# Patient Record
Sex: Female | Born: 1935 | Race: White | Hispanic: No | Smoking: Never smoker
Health system: Southern US, Community
[De-identification: ages and names within clinical notes are randomized; demographics above are authoritative.]

## PROBLEM LIST (undated history)

## (undated) DIAGNOSIS — I10 Essential (primary) hypertension: Secondary | ICD-10-CM

## (undated) DIAGNOSIS — Z8719 Personal history of other diseases of the digestive system: Secondary | ICD-10-CM

## (undated) DIAGNOSIS — E559 Vitamin D deficiency, unspecified: Secondary | ICD-10-CM

## (undated) DIAGNOSIS — Z86010 Personal history of colonic polyps: Secondary | ICD-10-CM

## (undated) DIAGNOSIS — K259 Gastric ulcer, unspecified as acute or chronic, without hemorrhage or perforation: Secondary | ICD-10-CM

## (undated) DIAGNOSIS — J189 Pneumonia, unspecified organism: Secondary | ICD-10-CM

## (undated) DIAGNOSIS — G8929 Other chronic pain: Secondary | ICD-10-CM

## (undated) DIAGNOSIS — N189 Chronic kidney disease, unspecified: Secondary | ICD-10-CM

## (undated) DIAGNOSIS — E079 Disorder of thyroid, unspecified: Secondary | ICD-10-CM

## (undated) DIAGNOSIS — F0390 Unspecified dementia without behavioral disturbance: Secondary | ICD-10-CM

## (undated) DIAGNOSIS — K219 Gastro-esophageal reflux disease without esophagitis: Secondary | ICD-10-CM

## (undated) HISTORY — DX: Personal history of other diseases of the digestive system: Z87.19

## (undated) HISTORY — DX: Unspecified dementia, unspecified severity, without behavioral disturbance, psychotic disturbance, mood disturbance, and anxiety: F03.90

## (undated) HISTORY — DX: Disorder of thyroid, unspecified: E07.9

## (undated) HISTORY — DX: Vitamin D deficiency, unspecified: E55.9

## (undated) HISTORY — DX: Pneumonia, unspecified organism: J18.9

## (undated) HISTORY — DX: Essential (primary) hypertension: I10

## (undated) HISTORY — DX: Personal history of colonic polyps: Z86.010

## (undated) HISTORY — DX: Gastric ulcer, unspecified as acute or chronic, without hemorrhage or perforation: K25.9

## (undated) HISTORY — DX: Gastro-esophageal reflux disease without esophagitis: K21.9

## (undated) HISTORY — DX: Other chronic pain: G89.29

## (undated) HISTORY — DX: Chronic kidney disease, unspecified: N18.9

---

## 1988-01-04 LAB — CONVERTED CEMR LAB: Pap Smear: NORMAL

## 2002-10-20 ENCOUNTER — Emergency Department (HOSPITAL_COMMUNITY): Admission: EM | Admit: 2002-10-20 | Discharge: 2002-10-20 | Payer: Self-pay | Admitting: Emergency Medicine

## 2006-08-24 ENCOUNTER — Emergency Department (HOSPITAL_COMMUNITY): Admission: EM | Admit: 2006-08-24 | Discharge: 2006-08-25 | Payer: Self-pay | Admitting: *Deleted

## 2007-12-30 ENCOUNTER — Telehealth: Payer: Self-pay | Admitting: Gastroenterology

## 2008-01-01 ENCOUNTER — Ambulatory Visit: Payer: Self-pay | Admitting: *Deleted

## 2008-01-01 DIAGNOSIS — F329 Major depressive disorder, single episode, unspecified: Secondary | ICD-10-CM

## 2008-01-01 DIAGNOSIS — Z860100 Personal history of colon polyps, unspecified: Secondary | ICD-10-CM | POA: Insufficient documentation

## 2008-01-01 DIAGNOSIS — R32 Unspecified urinary incontinence: Secondary | ICD-10-CM

## 2008-01-01 DIAGNOSIS — J4489 Other specified chronic obstructive pulmonary disease: Secondary | ICD-10-CM | POA: Insufficient documentation

## 2008-01-01 DIAGNOSIS — Z8719 Personal history of other diseases of the digestive system: Secondary | ICD-10-CM

## 2008-01-01 DIAGNOSIS — J449 Chronic obstructive pulmonary disease, unspecified: Secondary | ICD-10-CM

## 2008-01-01 DIAGNOSIS — R5381 Other malaise: Secondary | ICD-10-CM

## 2008-01-01 DIAGNOSIS — H409 Unspecified glaucoma: Secondary | ICD-10-CM | POA: Insufficient documentation

## 2008-01-01 DIAGNOSIS — G4700 Insomnia, unspecified: Secondary | ICD-10-CM

## 2008-01-01 DIAGNOSIS — K259 Gastric ulcer, unspecified as acute or chronic, without hemorrhage or perforation: Secondary | ICD-10-CM | POA: Insufficient documentation

## 2008-01-01 DIAGNOSIS — R197 Diarrhea, unspecified: Secondary | ICD-10-CM

## 2008-01-01 DIAGNOSIS — E039 Hypothyroidism, unspecified: Secondary | ICD-10-CM | POA: Insufficient documentation

## 2008-01-01 DIAGNOSIS — I2510 Atherosclerotic heart disease of native coronary artery without angina pectoris: Secondary | ICD-10-CM | POA: Insufficient documentation

## 2008-01-01 DIAGNOSIS — J309 Allergic rhinitis, unspecified: Secondary | ICD-10-CM | POA: Insufficient documentation

## 2008-01-01 DIAGNOSIS — M1990 Unspecified osteoarthritis, unspecified site: Secondary | ICD-10-CM

## 2008-01-01 DIAGNOSIS — I499 Cardiac arrhythmia, unspecified: Secondary | ICD-10-CM | POA: Insufficient documentation

## 2008-01-01 DIAGNOSIS — R5383 Other fatigue: Secondary | ICD-10-CM

## 2008-01-01 DIAGNOSIS — K219 Gastro-esophageal reflux disease without esophagitis: Secondary | ICD-10-CM

## 2008-01-01 DIAGNOSIS — I10 Essential (primary) hypertension: Secondary | ICD-10-CM | POA: Insufficient documentation

## 2008-01-01 HISTORY — DX: Personal history of other diseases of the digestive system: Z87.19

## 2008-01-01 HISTORY — DX: Personal history of colonic polyps: Z86.010

## 2008-01-01 HISTORY — DX: Personal history of colon polyps, unspecified: Z86.0100

## 2008-01-01 HISTORY — DX: Gastric ulcer, unspecified as acute or chronic, without hemorrhage or perforation: K25.9

## 2008-01-08 LAB — CONVERTED CEMR LAB
AST: 26 units/L (ref 0–37)
BUN: 21 mg/dL (ref 6–23)
Basophils Absolute: 0.1 10*3/uL (ref 0.0–0.1)
Calcium: 9.8 mg/dL (ref 8.4–10.5)
Chloride: 101 meq/L (ref 96–112)
Glucose, Bld: 130 mg/dL — ABNORMAL HIGH (ref 70–99)
HCT: 45.3 % (ref 36.0–46.0)
Lymphocytes Relative: 20 % (ref 12–46)
Lymphs Abs: 2.8 10*3/uL (ref 0.7–4.0)
MCHC: 33.3 g/dL (ref 30.0–36.0)
MCV: 91.7 fL (ref 78.0–100.0)
Neutrophils Relative %: 71 % (ref 43–77)
Platelets: 243 10*3/uL (ref 150–400)
Potassium: 3.9 meq/L (ref 3.5–5.3)
RDW: 12.5 % (ref 11.5–15.5)
Sodium: 137 meq/L (ref 135–145)
TSH: 2.435 microintl units/mL (ref 0.350–4.50)
Total Protein: 7.8 g/dL (ref 6.0–8.3)

## 2008-01-15 ENCOUNTER — Telehealth: Payer: Self-pay | Admitting: Gastroenterology

## 2008-09-30 ENCOUNTER — Emergency Department (HOSPITAL_COMMUNITY): Admission: EM | Admit: 2008-09-30 | Discharge: 2008-09-30 | Payer: Self-pay | Admitting: Emergency Medicine

## 2009-07-25 ENCOUNTER — Observation Stay (HOSPITAL_COMMUNITY): Admission: EM | Admit: 2009-07-25 | Discharge: 2009-07-28 | Payer: Self-pay | Admitting: Emergency Medicine

## 2009-08-19 ENCOUNTER — Inpatient Hospital Stay (HOSPITAL_COMMUNITY): Admission: EM | Admit: 2009-08-19 | Discharge: 2009-08-22 | Payer: Self-pay | Admitting: Emergency Medicine

## 2009-08-21 ENCOUNTER — Encounter (INDEPENDENT_AMBULATORY_CARE_PROVIDER_SITE_OTHER): Payer: Self-pay | Admitting: Internal Medicine

## 2009-08-21 ENCOUNTER — Ambulatory Visit: Payer: Self-pay | Admitting: Vascular Surgery

## 2010-07-24 LAB — CARDIAC PANEL(CRET KIN+CKTOT+MB+TROPI)
CK, MB: 3.9 ng/mL (ref 0.3–4.0)
Relative Index: 1.8 (ref 0.0–2.5)
Relative Index: 1.8 (ref 0.0–2.5)
Total CK: 215 U/L — ABNORMAL HIGH (ref 7–177)
Troponin I: 0.06 ng/mL (ref 0.00–0.06)

## 2010-07-24 LAB — URINALYSIS, ROUTINE W REFLEX MICROSCOPIC
Ketones, ur: NEGATIVE mg/dL
Nitrite: NEGATIVE
Protein, ur: NEGATIVE mg/dL
pH: 6 (ref 5.0–8.0)

## 2010-07-24 LAB — COMPREHENSIVE METABOLIC PANEL
BUN: 61 mg/dL — ABNORMAL HIGH (ref 6–23)
CO2: 29 mEq/L (ref 19–32)
Chloride: 97 mEq/L (ref 96–112)
Creatinine, Ser: 1.87 mg/dL — ABNORMAL HIGH (ref 0.4–1.2)
GFR calc non Af Amer: 25 mL/min — ABNORMAL LOW (ref >60)
Glucose, Bld: 150 mg/dL — ABNORMAL HIGH (ref 70–99)
Potassium: 5 mEq/L (ref 3.5–5.1)
Total Bilirubin: 0.9 mg/dL (ref 0.3–1.2)
Total Protein: 7.1 g/dL (ref 6.0–8.3)

## 2010-07-24 LAB — BASIC METABOLIC PANEL
BUN: 32 mg/dL — ABNORMAL HIGH (ref 6–23)
CO2: 28 mEq/L (ref 19–32)
CO2: 28 mEq/L (ref 19–32)
Calcium: 8.5 mg/dL (ref 8.4–10.5)
Calcium: 8.8 mg/dL (ref 8.4–10.5)
Chloride: 100 mEq/L (ref 96–112)
Chloride: 99 mEq/L (ref 96–112)
Creatinine, Ser: 1.05 mg/dL (ref 0.4–1.2)
GFR calc Af Amer: 59 mL/min — ABNORMAL LOW (ref >60)
GFR calc Af Amer: >60 mL/min (ref >60)
Glucose, Bld: 132 mg/dL — ABNORMAL HIGH (ref 70–99)
Potassium: 4.6 mEq/L (ref 3.5–5.1)
Sodium: 134 mEq/L — ABNORMAL LOW (ref 135–145)
Sodium: 135 mEq/L (ref 135–145)

## 2010-07-24 LAB — TSH: TSH: 1.808 u[IU]/mL (ref 0.350–4.500)

## 2010-07-24 LAB — POCT CARDIAC MARKERS
CKMB, poc: 1.2 ng/mL (ref 1.0–8.0)
Myoglobin, poc: 227 ng/mL (ref 12–200)
Troponin i, poc: <0.05 ng/mL (ref 0.00–0.09)
Troponin i, poc: <0.05 ng/mL (ref 0.00–0.09)

## 2010-07-24 LAB — DIFFERENTIAL
Basophils Relative: 0 % (ref 0–1)
Eosinophils Absolute: 0.1 10*3/uL (ref 0.0–0.7)
Lymphocytes Relative: 6 % — ABNORMAL LOW (ref 12–46)
Lymphs Abs: 1.2 10*3/uL (ref 0.7–4.0)
Neutro Abs: 17.5 10*3/uL — ABNORMAL HIGH (ref 1.7–7.7)
Neutrophils Relative %: 88 % — ABNORMAL HIGH (ref 43–77)

## 2010-07-24 LAB — CBC
HCT: 40 % (ref 36.0–46.0)
Hemoglobin: 12.8 g/dL (ref 12.0–15.0)
Hemoglobin: 13.1 g/dL (ref 12.0–15.0)
MCHC: 34.9 g/dL (ref 30.0–36.0)
MCHC: 35.5 g/dL (ref 30.0–36.0)
Platelets: 160 10*3/uL (ref 150–400)
Platelets: 182 10*3/uL (ref 150–400)
RBC: 3.76 MIL/uL — ABNORMAL LOW (ref 3.87–5.11)
RBC: 3.97 MIL/uL (ref 3.87–5.11)
RBC: 4.05 MIL/uL (ref 3.87–5.11)
WBC: 11.6 10*3/uL — ABNORMAL HIGH (ref 4.0–10.5)
WBC: 11.7 10*3/uL — ABNORMAL HIGH (ref 4.0–10.5)
WBC: 18.1 10*3/uL — ABNORMAL HIGH (ref 4.0–10.5)
WBC: 19.8 10*3/uL — ABNORMAL HIGH (ref 4.0–10.5)

## 2010-07-24 LAB — CULTURE, BLOOD (ROUTINE X 2): Culture: NO GROWTH

## 2010-07-24 LAB — CK TOTAL AND CKMB (NOT AT ARMC)
CK, MB: 4 ng/mL (ref 0.3–4.0)
Total CK: 174 U/L (ref 7–177)

## 2010-07-24 LAB — TROPONIN I: Troponin I: 0.02 ng/mL (ref 0.00–0.06)

## 2010-07-24 LAB — BRAIN NATRIURETIC PEPTIDE: Pro B Natriuretic peptide (BNP): 250 pg/mL — ABNORMAL HIGH (ref 0.0–100.0)

## 2010-07-29 LAB — BASIC METABOLIC PANEL
BUN: 23 mg/dL (ref 6–23)
CO2: 26 mEq/L (ref 19–32)
CO2: 26 mEq/L (ref 19–32)
Calcium: 8.8 mg/dL (ref 8.4–10.5)
Calcium: 8.9 mg/dL (ref 8.4–10.5)
Calcium: 9.3 mg/dL (ref 8.4–10.5)
Chloride: 103 mEq/L (ref 96–112)
Chloride: 107 mEq/L (ref 96–112)
Creatinine, Ser: 0.69 mg/dL (ref 0.4–1.2)
Creatinine, Ser: 0.85 mg/dL (ref 0.4–1.2)
GFR calc Af Amer: >60 mL/min (ref >60)
GFR calc Af Amer: >60 mL/min (ref >60)
GFR calc Af Amer: >60 mL/min (ref >60)
GFR calc Af Amer: >60 mL/min (ref >60)
GFR calc non Af Amer: >60 mL/min (ref >60)
GFR calc non Af Amer: >60 mL/min (ref >60)
Glucose, Bld: 104 mg/dL — ABNORMAL HIGH (ref 70–99)
Glucose, Bld: 90 mg/dL (ref 70–99)
Potassium: 4.2 mEq/L (ref 3.5–5.1)
Potassium: 4.5 mEq/L (ref 3.5–5.1)
Sodium: 136 mEq/L (ref 135–145)
Sodium: 136 mEq/L (ref 135–145)
Sodium: 139 mEq/L (ref 135–145)

## 2010-07-29 LAB — CBC
HCT: 42 % (ref 36.0–46.0)
HCT: 46.1 % — ABNORMAL HIGH (ref 36.0–46.0)
Hemoglobin: 15.2 g/dL — ABNORMAL HIGH (ref 12.0–15.0)
MCHC: 33 g/dL (ref 30.0–36.0)
MCV: 94.1 fL (ref 78.0–100.0)
Platelets: 189 10*3/uL (ref 150–400)
RBC: 4.9 MIL/uL (ref 3.87–5.11)
RDW: 12.6 % (ref 11.5–15.5)
RDW: 12.8 % (ref 11.5–15.5)
WBC: 11.7 10*3/uL — ABNORMAL HIGH (ref 4.0–10.5)

## 2010-07-29 LAB — URINALYSIS, ROUTINE W REFLEX MICROSCOPIC
Bilirubin Urine: NEGATIVE
Hgb urine dipstick: NEGATIVE
Ketones, ur: NEGATIVE mg/dL
Nitrite: NEGATIVE
Specific Gravity, Urine: 1.025 (ref 1.005–1.030)
Urobilinogen, UA: 0.2 mg/dL (ref 0.0–1.0)

## 2010-07-29 LAB — DIFFERENTIAL
Basophils Absolute: 0 10*3/uL (ref 0.0–0.1)
Basophils Relative: 0 % (ref 0–1)
Eosinophils Absolute: 0.2 10*3/uL (ref 0.0–0.7)
Eosinophils Relative: 1 % (ref 0–5)
Monocytes Absolute: 0.9 10*3/uL (ref 0.1–1.0)
Monocytes Relative: 7 % (ref 3–12)

## 2010-07-29 LAB — CARDIAC PANEL(CRET KIN+CKTOT+MB+TROPI)
CK, MB: 3 ng/mL (ref 0.3–4.0)
Relative Index: INVALID (ref 0.0–2.5)
Total CK: 47 U/L (ref 7–177)
Troponin I: 0.01 ng/mL (ref 0.00–0.06)
Troponin I: 0.02 ng/mL (ref 0.00–0.06)

## 2010-07-29 LAB — URINE CULTURE

## 2010-07-29 LAB — T4, FREE: Free T4: 1.07 ng/dL (ref 0.80–1.80)

## 2010-07-29 LAB — TROPONIN I: Troponin I: 0.01 ng/mL (ref 0.00–0.06)

## 2010-07-29 LAB — CK TOTAL AND CKMB (NOT AT ARMC)
CK, MB: 2.6 ng/mL (ref 0.3–4.0)
Total CK: 113 U/L (ref 7–177)

## 2011-07-01 DIAGNOSIS — F411 Generalized anxiety disorder: Secondary | ICD-10-CM | POA: Diagnosis not present

## 2011-07-01 DIAGNOSIS — F329 Major depressive disorder, single episode, unspecified: Secondary | ICD-10-CM | POA: Diagnosis not present

## 2011-07-01 DIAGNOSIS — F0390 Unspecified dementia without behavioral disturbance: Secondary | ICD-10-CM | POA: Diagnosis not present

## 2011-07-11 DIAGNOSIS — M79609 Pain in unspecified limb: Secondary | ICD-10-CM | POA: Diagnosis not present

## 2011-07-11 DIAGNOSIS — B351 Tinea unguium: Secondary | ICD-10-CM | POA: Diagnosis not present

## 2011-08-05 DIAGNOSIS — G890 Central pain syndrome: Secondary | ICD-10-CM | POA: Diagnosis not present

## 2011-08-05 DIAGNOSIS — E559 Vitamin D deficiency, unspecified: Secondary | ICD-10-CM | POA: Diagnosis not present

## 2011-08-05 DIAGNOSIS — I10 Essential (primary) hypertension: Secondary | ICD-10-CM | POA: Diagnosis not present

## 2011-08-05 DIAGNOSIS — M1990 Unspecified osteoarthritis, unspecified site: Secondary | ICD-10-CM | POA: Diagnosis not present

## 2011-08-05 DIAGNOSIS — R609 Edema, unspecified: Secondary | ICD-10-CM | POA: Diagnosis not present

## 2011-08-05 DIAGNOSIS — E039 Hypothyroidism, unspecified: Secondary | ICD-10-CM | POA: Diagnosis not present

## 2011-08-06 DIAGNOSIS — Z961 Presence of intraocular lens: Secondary | ICD-10-CM | POA: Diagnosis not present

## 2011-08-06 DIAGNOSIS — H52209 Unspecified astigmatism, unspecified eye: Secondary | ICD-10-CM | POA: Diagnosis not present

## 2011-08-06 DIAGNOSIS — E875 Hyperkalemia: Secondary | ICD-10-CM | POA: Diagnosis not present

## 2011-08-06 DIAGNOSIS — E039 Hypothyroidism, unspecified: Secondary | ICD-10-CM | POA: Diagnosis not present

## 2011-08-06 DIAGNOSIS — E785 Hyperlipidemia, unspecified: Secondary | ICD-10-CM | POA: Diagnosis not present

## 2011-08-06 DIAGNOSIS — D649 Anemia, unspecified: Secondary | ICD-10-CM | POA: Diagnosis not present

## 2011-08-06 DIAGNOSIS — H31019 Macula scars of posterior pole (postinflammatory) (post-traumatic), unspecified eye: Secondary | ICD-10-CM | POA: Diagnosis not present

## 2011-08-06 DIAGNOSIS — H4011X0 Primary open-angle glaucoma, stage unspecified: Secondary | ICD-10-CM | POA: Diagnosis not present

## 2011-08-06 DIAGNOSIS — E559 Vitamin D deficiency, unspecified: Secondary | ICD-10-CM | POA: Diagnosis not present

## 2011-08-19 DIAGNOSIS — F411 Generalized anxiety disorder: Secondary | ICD-10-CM | POA: Diagnosis not present

## 2011-08-19 DIAGNOSIS — F0390 Unspecified dementia without behavioral disturbance: Secondary | ICD-10-CM | POA: Diagnosis not present

## 2011-09-05 DIAGNOSIS — E785 Hyperlipidemia, unspecified: Secondary | ICD-10-CM | POA: Diagnosis not present

## 2011-09-05 DIAGNOSIS — E875 Hyperkalemia: Secondary | ICD-10-CM | POA: Diagnosis not present

## 2011-09-05 DIAGNOSIS — D649 Anemia, unspecified: Secondary | ICD-10-CM | POA: Diagnosis not present

## 2011-09-23 DIAGNOSIS — F0390 Unspecified dementia without behavioral disturbance: Secondary | ICD-10-CM | POA: Diagnosis not present

## 2011-09-23 DIAGNOSIS — F411 Generalized anxiety disorder: Secondary | ICD-10-CM | POA: Diagnosis not present

## 2011-11-05 DIAGNOSIS — E46 Unspecified protein-calorie malnutrition: Secondary | ICD-10-CM | POA: Diagnosis not present

## 2011-11-13 DIAGNOSIS — M79609 Pain in unspecified limb: Secondary | ICD-10-CM | POA: Diagnosis not present

## 2011-11-13 DIAGNOSIS — B351 Tinea unguium: Secondary | ICD-10-CM | POA: Diagnosis not present

## 2011-11-26 DIAGNOSIS — I10 Essential (primary) hypertension: Secondary | ICD-10-CM | POA: Diagnosis not present

## 2011-11-26 DIAGNOSIS — E039 Hypothyroidism, unspecified: Secondary | ICD-10-CM | POA: Diagnosis not present

## 2011-11-26 DIAGNOSIS — G890 Central pain syndrome: Secondary | ICD-10-CM | POA: Diagnosis not present

## 2011-11-26 DIAGNOSIS — F329 Major depressive disorder, single episode, unspecified: Secondary | ICD-10-CM | POA: Diagnosis not present

## 2011-11-26 DIAGNOSIS — E559 Vitamin D deficiency, unspecified: Secondary | ICD-10-CM | POA: Diagnosis not present

## 2011-11-26 DIAGNOSIS — R609 Edema, unspecified: Secondary | ICD-10-CM | POA: Diagnosis not present

## 2011-11-28 DIAGNOSIS — E039 Hypothyroidism, unspecified: Secondary | ICD-10-CM | POA: Diagnosis not present

## 2011-11-28 DIAGNOSIS — D649 Anemia, unspecified: Secondary | ICD-10-CM | POA: Diagnosis not present

## 2011-11-28 DIAGNOSIS — I10 Essential (primary) hypertension: Secondary | ICD-10-CM | POA: Diagnosis not present

## 2011-11-28 DIAGNOSIS — E559 Vitamin D deficiency, unspecified: Secondary | ICD-10-CM | POA: Diagnosis not present

## 2011-12-10 DIAGNOSIS — H4011X0 Primary open-angle glaucoma, stage unspecified: Secondary | ICD-10-CM | POA: Diagnosis not present

## 2011-12-10 DIAGNOSIS — Z961 Presence of intraocular lens: Secondary | ICD-10-CM | POA: Diagnosis not present

## 2011-12-10 DIAGNOSIS — H31019 Macula scars of posterior pole (postinflammatory) (post-traumatic), unspecified eye: Secondary | ICD-10-CM | POA: Diagnosis not present

## 2012-01-15 DIAGNOSIS — F411 Generalized anxiety disorder: Secondary | ICD-10-CM | POA: Diagnosis not present

## 2012-01-15 DIAGNOSIS — F0390 Unspecified dementia without behavioral disturbance: Secondary | ICD-10-CM | POA: Diagnosis not present

## 2012-01-22 DIAGNOSIS — B351 Tinea unguium: Secondary | ICD-10-CM | POA: Diagnosis not present

## 2012-01-22 DIAGNOSIS — M79609 Pain in unspecified limb: Secondary | ICD-10-CM | POA: Diagnosis not present

## 2012-01-27 DIAGNOSIS — F0390 Unspecified dementia without behavioral disturbance: Secondary | ICD-10-CM | POA: Diagnosis not present

## 2012-01-27 DIAGNOSIS — F411 Generalized anxiety disorder: Secondary | ICD-10-CM | POA: Diagnosis not present

## 2012-02-04 DIAGNOSIS — F411 Generalized anxiety disorder: Secondary | ICD-10-CM | POA: Diagnosis not present

## 2012-02-04 DIAGNOSIS — F0390 Unspecified dementia without behavioral disturbance: Secondary | ICD-10-CM | POA: Diagnosis not present

## 2012-02-19 DIAGNOSIS — F0390 Unspecified dementia without behavioral disturbance: Secondary | ICD-10-CM | POA: Diagnosis not present

## 2012-02-19 DIAGNOSIS — F411 Generalized anxiety disorder: Secondary | ICD-10-CM | POA: Diagnosis not present

## 2012-04-07 DIAGNOSIS — F411 Generalized anxiety disorder: Secondary | ICD-10-CM | POA: Diagnosis not present

## 2012-04-07 DIAGNOSIS — F0390 Unspecified dementia without behavioral disturbance: Secondary | ICD-10-CM | POA: Diagnosis not present

## 2012-04-21 DIAGNOSIS — I10 Essential (primary) hypertension: Secondary | ICD-10-CM | POA: Diagnosis not present

## 2012-04-21 DIAGNOSIS — M1990 Unspecified osteoarthritis, unspecified site: Secondary | ICD-10-CM | POA: Diagnosis not present

## 2012-04-21 DIAGNOSIS — R5383 Other fatigue: Secondary | ICD-10-CM | POA: Diagnosis not present

## 2012-04-21 DIAGNOSIS — R609 Edema, unspecified: Secondary | ICD-10-CM | POA: Diagnosis not present

## 2012-05-18 DIAGNOSIS — G890 Central pain syndrome: Secondary | ICD-10-CM | POA: Diagnosis not present

## 2012-05-18 DIAGNOSIS — M1990 Unspecified osteoarthritis, unspecified site: Secondary | ICD-10-CM | POA: Diagnosis not present

## 2012-05-18 DIAGNOSIS — E039 Hypothyroidism, unspecified: Secondary | ICD-10-CM | POA: Diagnosis not present

## 2012-05-18 DIAGNOSIS — R609 Edema, unspecified: Secondary | ICD-10-CM | POA: Diagnosis not present

## 2012-05-18 DIAGNOSIS — I10 Essential (primary) hypertension: Secondary | ICD-10-CM | POA: Diagnosis not present

## 2012-05-18 DIAGNOSIS — E559 Vitamin D deficiency, unspecified: Secondary | ICD-10-CM | POA: Diagnosis not present

## 2012-05-19 DIAGNOSIS — I10 Essential (primary) hypertension: Secondary | ICD-10-CM | POA: Diagnosis not present

## 2012-05-19 DIAGNOSIS — E039 Hypothyroidism, unspecified: Secondary | ICD-10-CM | POA: Diagnosis not present

## 2012-05-19 DIAGNOSIS — Z79899 Other long term (current) drug therapy: Secondary | ICD-10-CM | POA: Diagnosis not present

## 2012-06-08 DIAGNOSIS — F0390 Unspecified dementia without behavioral disturbance: Secondary | ICD-10-CM | POA: Diagnosis not present

## 2012-06-08 DIAGNOSIS — F411 Generalized anxiety disorder: Secondary | ICD-10-CM | POA: Diagnosis not present

## 2012-06-18 DIAGNOSIS — N390 Urinary tract infection, site not specified: Secondary | ICD-10-CM | POA: Diagnosis not present

## 2012-06-22 DIAGNOSIS — E119 Type 2 diabetes mellitus without complications: Secondary | ICD-10-CM | POA: Diagnosis not present

## 2012-06-22 DIAGNOSIS — Z9283 Personal history of failed moderate sedation: Secondary | ICD-10-CM | POA: Diagnosis not present

## 2012-06-22 DIAGNOSIS — Z9181 History of falling: Secondary | ICD-10-CM | POA: Diagnosis not present

## 2012-06-22 DIAGNOSIS — N390 Urinary tract infection, site not specified: Secondary | ICD-10-CM | POA: Diagnosis not present

## 2012-07-02 DIAGNOSIS — R1110 Vomiting, unspecified: Secondary | ICD-10-CM | POA: Diagnosis not present

## 2012-07-02 DIAGNOSIS — R112 Nausea with vomiting, unspecified: Secondary | ICD-10-CM | POA: Diagnosis not present

## 2012-07-06 DIAGNOSIS — F0390 Unspecified dementia without behavioral disturbance: Secondary | ICD-10-CM | POA: Diagnosis not present

## 2012-07-06 DIAGNOSIS — F411 Generalized anxiety disorder: Secondary | ICD-10-CM | POA: Diagnosis not present

## 2012-08-05 ENCOUNTER — Other Ambulatory Visit: Payer: Self-pay | Admitting: *Deleted

## 2012-08-05 MED ORDER — MORPHINE SULFATE ER 15 MG PO TBCR: EXTENDED_RELEASE_TABLET | ORAL | Status: DC

## 2012-09-02 ENCOUNTER — Other Ambulatory Visit: Payer: Self-pay | Admitting: *Deleted

## 2012-09-02 MED ORDER — MORPHINE SULFATE ER 15 MG PO TBCR: EXTENDED_RELEASE_TABLET | ORAL | Status: DC

## 2012-09-07 DIAGNOSIS — M79609 Pain in unspecified limb: Secondary | ICD-10-CM | POA: Diagnosis not present

## 2012-09-07 DIAGNOSIS — B351 Tinea unguium: Secondary | ICD-10-CM | POA: Diagnosis not present

## 2012-09-17 DIAGNOSIS — E875 Hyperkalemia: Secondary | ICD-10-CM | POA: Diagnosis not present

## 2012-09-17 DIAGNOSIS — E559 Vitamin D deficiency, unspecified: Secondary | ICD-10-CM | POA: Diagnosis not present

## 2012-09-17 DIAGNOSIS — F0390 Unspecified dementia without behavioral disturbance: Secondary | ICD-10-CM | POA: Diagnosis not present

## 2012-10-02 ENCOUNTER — Other Ambulatory Visit: Payer: Self-pay | Admitting: Geriatric Medicine

## 2012-10-02 MED ORDER — MORPHINE SULFATE ER 15 MG PO TBCR: EXTENDED_RELEASE_TABLET | ORAL | Status: DC

## 2012-11-04 ENCOUNTER — Other Ambulatory Visit: Payer: Self-pay | Admitting: *Deleted

## 2012-11-04 MED ORDER — MORPHINE SULFATE ER 15 MG PO TBCR: EXTENDED_RELEASE_TABLET | ORAL | Status: DC

## 2012-11-23 DIAGNOSIS — B351 Tinea unguium: Secondary | ICD-10-CM | POA: Diagnosis not present

## 2012-12-02 DIAGNOSIS — F0390 Unspecified dementia without behavioral disturbance: Secondary | ICD-10-CM | POA: Diagnosis not present

## 2012-12-16 ENCOUNTER — Non-Acute Institutional Stay (SKILLED_NURSING_FACILITY): Payer: Medicare Other | Admitting: Adult Health

## 2012-12-16 DIAGNOSIS — I10 Essential (primary) hypertension: Secondary | ICD-10-CM | POA: Diagnosis not present

## 2012-12-16 DIAGNOSIS — E039 Hypothyroidism, unspecified: Secondary | ICD-10-CM | POA: Diagnosis not present

## 2012-12-16 DIAGNOSIS — F0390 Unspecified dementia without behavioral disturbance: Secondary | ICD-10-CM

## 2012-12-16 DIAGNOSIS — M1990 Unspecified osteoarthritis, unspecified site: Secondary | ICD-10-CM | POA: Diagnosis not present

## 2012-12-16 DIAGNOSIS — H409 Unspecified glaucoma: Secondary | ICD-10-CM

## 2012-12-16 DIAGNOSIS — R609 Edema, unspecified: Secondary | ICD-10-CM

## 2012-12-16 DIAGNOSIS — K5900 Constipation, unspecified: Secondary | ICD-10-CM

## 2012-12-16 DIAGNOSIS — K219 Gastro-esophageal reflux disease without esophagitis: Secondary | ICD-10-CM

## 2012-12-22 ENCOUNTER — Encounter: Payer: Self-pay | Admitting: Adult Health

## 2012-12-23 ENCOUNTER — Encounter: Payer: Self-pay | Admitting: Adult Health

## 2012-12-23 DIAGNOSIS — F0390 Unspecified dementia without behavioral disturbance: Secondary | ICD-10-CM | POA: Insufficient documentation

## 2012-12-23 DIAGNOSIS — K5900 Constipation, unspecified: Secondary | ICD-10-CM | POA: Insufficient documentation

## 2012-12-23 DIAGNOSIS — R609 Edema, unspecified: Secondary | ICD-10-CM | POA: Insufficient documentation

## 2012-12-23 NOTE — Assessment & Plan Note (Signed)
Will continue xalatan to both eyes nightly

## 2012-12-23 NOTE — Assessment & Plan Note (Signed)
Is without change in status will continue namenda xr 28 mg daily and will monitor her status

## 2012-12-23 NOTE — Assessment & Plan Note (Signed)
Is stable will continue lisinopril 20 mg daily; norvasc 5 mg daily; clonidine 0.1 mg daily and will monitor

## 2012-12-23 NOTE — Assessment & Plan Note (Signed)
Will continue senna s 2 tabs daily  

## 2012-12-23 NOTE — Progress Notes (Signed)
Patient ID: Shannon Gomez, female   DOB: [birth year], 79 y.o.   MRN: [id number]  [facility]   Allergies  Allergen Reactions  . Sulfonamide Derivatives      Chief Complaint  Patient presents with  . Medical Managment of Chronic Issues    HPI: She is being seen for the management of her chronic illnesses. There are no concerns being voiced by the nursing staff at this time. ovell her status remains unchanged.   Past Medical History  Diagnosis Date  . Thyroid disease   . Hypertension   . GERD (gastroesophageal reflux disease)   . Vitamin D deficiency   . Dementia   . Chronic pain   . Chronic kidney disease     No past surgical history on file.  VITAL SIGNS BP 115/61  Pulse 68  Wt 137 lb 9.6 oz (62.415 kg)  BMI 26.87 kg/m2   Patient's Medications  New Prescriptions   No medications on file  Previous Medications   AMLODIPINE (NORVASC) 5 MG TABLET    Take 5 mg by mouth daily.   CALCIUM CARBONATE (TUMS - DOSED IN MG ELEMENTAL CALCIUM) 500 MG CHEWABLE TABLET    Chew 1 tablet by mouth 3 (three) times daily.   CHOLECALCIFEROL (VITAMIN D) 1000 UNITS TABLET    Take 1,000 Units by mouth daily.   CLONIDINE (CATAPRES) 0.1 MG TABLET    Take 0.1 mg by mouth daily.   FUROSEMIDE (LASIX) 20 MG TABLET    Take 20 mg by mouth.   LATANOPROST (XALATAN) 0.005 % OPHTHALMIC SOLUTION    Place 1 drop into both eyes at bedtime.   LEVOTHYROXINE (SYNTHROID, LEVOTHROID) 25 MCG TABLET    Take 25 mcg by mouth daily before breakfast.   LISINOPRIL (PRINIVIL,ZESTRIL) 20 MG TABLET    Take 20 mg by mouth daily.   MEMANTINE HCL ER (NAMENDA XR) 28 MG CP24    Take 28 mg by mouth daily.   METOCLOPRAMIDE (REGLAN) 5 MG TABLET    Take 2.5 mg by mouth 4 (four) times daily.   MORPHINE (MS CONTIN) 15 MG 12 HR TABLET    Take one tablet by mouth every 12 hours for chronic pain for osteoarthritis. Do not crush   MULTIPLE VITAMIN (MULTIVITAMIN) TABLET    Take 1 tablet by mouth daily.   OMEPRAZOLE (PRILOSEC) 20 MG  CAPSULE    Take 20 mg by mouth daily.   SENNOSIDES-DOCUSATE SODIUM (SENOKOT-S) 8.6-50 MG TABLET    Take 2 tablets by mouth daily.  Modified Medications   No medications on file  Discontinued Medications   No medications on file    SIGNIFICANT DIAGNOSTIC EXAMS   LABS REVIEWED:  05-19-12: wbc 12.1; hgb 12.7; hct 39.1; mcv 95.6; plt 22; tsh 1.295 07-02-12: wbc 11.0; hgb 12.6; hct 37.0; mcv 89.4; plt 189; glucose 121; bun 39; creat 1.37; k+ 3.8;  Na++ 137 09-17-12: glucose 91; bun 40; creat 1.43; k+ 4.3; na++ 136; vit d 41    Review of Systems  Unable to perform ROS   Physical Exam  Constitutional:  thin  Neck: Neck supple. No JVD present. No thyromegaly present.  Cardiovascular: Normal rate, regular rhythm and intact distal pulses.   Respiratory: Effort normal and breath sounds normal. No respiratory distress. She has no wheezes.  GI: Soft. Bowel sounds are normal. She exhibits no distension. There is no tenderness.  Musculoskeletal: She exhibits no edema.  Able to move extremities   Neurological: She is alert.  Skin: Skin is  warm and dry.      ASSESSMENT/ PLAN:  HYPOTHYROIDISM Will continue her synthroid at 25 mcg daily and will monitor her status   OSTEOARTHRITIS Her pain is being managed there are no indications of pain present will continue ms contin 15 mg twice daily and will monitor   HYPERTENSION Is stable will continue lisinopril 20 mg daily; norvasc 5 mg daily; clonidine 0.1 mg daily and will monitor   Edema Will continue lasix 20 mg  daily and will monitor   GLAUCOMA Will continue xalatan to both eyes nightly   GERD Is stable will continue pirlosec 20 mg daily and reglan 2.5 mg four times daily and will monitor   Dementia Is without change in status will continue namenda xr 28 mg daily and will monitor her status   Constipation Will continue senna s 2 tabs daily    Time spent with patient 50 minutes

## 2012-12-23 NOTE — Assessment & Plan Note (Signed)
Is stable will continue pirlosec 20 mg daily and reglan 2.5 mg four times daily and will monitor

## 2012-12-23 NOTE — Assessment & Plan Note (Signed)
Her pain is being managed there are no indications of pain present will continue ms contin 15 mg twice daily and will monitor

## 2012-12-23 NOTE — Assessment & Plan Note (Signed)
Will continue her synthroid at 25 mcg daily and will monitor her status

## 2012-12-23 NOTE — Assessment & Plan Note (Addendum)
Will continue lasix 20 mg daily and will monitor 

## 2013-01-07 ENCOUNTER — Other Ambulatory Visit: Payer: Self-pay | Admitting: *Deleted

## 2013-01-07 MED ORDER — MORPHINE SULFATE ER 15 MG PO TBCR: EXTENDED_RELEASE_TABLET | ORAL | Status: DC

## 2013-01-14 DIAGNOSIS — F0390 Unspecified dementia without behavioral disturbance: Secondary | ICD-10-CM | POA: Diagnosis not present

## 2013-01-22 DIAGNOSIS — E039 Hypothyroidism, unspecified: Secondary | ICD-10-CM | POA: Diagnosis not present

## 2013-02-08 ENCOUNTER — Other Ambulatory Visit: Payer: Self-pay | Admitting: *Deleted

## 2013-02-08 MED ORDER — MORPHINE SULFATE ER 15 MG PO TBCR: EXTENDED_RELEASE_TABLET | ORAL | Status: DC

## 2013-02-15 DIAGNOSIS — R262 Difficulty in walking, not elsewhere classified: Secondary | ICD-10-CM | POA: Diagnosis not present

## 2013-02-15 DIAGNOSIS — B351 Tinea unguium: Secondary | ICD-10-CM | POA: Diagnosis not present

## 2013-02-24 DIAGNOSIS — F0390 Unspecified dementia without behavioral disturbance: Secondary | ICD-10-CM | POA: Diagnosis not present

## 2013-03-06 DIAGNOSIS — Z23 Encounter for immunization: Secondary | ICD-10-CM | POA: Diagnosis not present

## 2013-03-10 DIAGNOSIS — E875 Hyperkalemia: Secondary | ICD-10-CM | POA: Diagnosis not present

## 2013-03-10 DIAGNOSIS — E559 Vitamin D deficiency, unspecified: Secondary | ICD-10-CM | POA: Diagnosis not present

## 2013-03-11 ENCOUNTER — Other Ambulatory Visit: Payer: Self-pay | Admitting: *Deleted

## 2013-03-11 MED ORDER — MORPHINE SULFATE ER 15 MG PO TBCR: EXTENDED_RELEASE_TABLET | ORAL | Status: DC

## 2013-04-09 ENCOUNTER — Other Ambulatory Visit: Payer: Self-pay | Admitting: *Deleted

## 2013-04-09 MED ORDER — MORPHINE SULFATE ER 15 MG PO TBCR: EXTENDED_RELEASE_TABLET | ORAL | Status: DC

## 2013-04-27 ENCOUNTER — Encounter: Payer: Self-pay | Admitting: Adult Health

## 2013-04-27 ENCOUNTER — Non-Acute Institutional Stay (SKILLED_NURSING_FACILITY): Payer: Medicare Other | Admitting: Adult Health

## 2013-04-27 DIAGNOSIS — B351 Tinea unguium: Secondary | ICD-10-CM | POA: Diagnosis not present

## 2013-04-27 DIAGNOSIS — E039 Hypothyroidism, unspecified: Secondary | ICD-10-CM

## 2013-04-27 DIAGNOSIS — I10 Essential (primary) hypertension: Secondary | ICD-10-CM | POA: Diagnosis not present

## 2013-04-27 DIAGNOSIS — K117 Disturbances of salivary secretion: Secondary | ICD-10-CM

## 2013-04-27 DIAGNOSIS — H409 Unspecified glaucoma: Secondary | ICD-10-CM

## 2013-04-27 DIAGNOSIS — I70209 Unspecified atherosclerosis of native arteries of extremities, unspecified extremity: Secondary | ICD-10-CM | POA: Diagnosis not present

## 2013-04-27 DIAGNOSIS — M1990 Unspecified osteoarthritis, unspecified site: Secondary | ICD-10-CM

## 2013-04-27 DIAGNOSIS — K5900 Constipation, unspecified: Secondary | ICD-10-CM

## 2013-04-27 DIAGNOSIS — R609 Edema, unspecified: Secondary | ICD-10-CM

## 2013-04-27 DIAGNOSIS — K219 Gastro-esophageal reflux disease without esophagitis: Secondary | ICD-10-CM

## 2013-04-27 DIAGNOSIS — F0390 Unspecified dementia without behavioral disturbance: Secondary | ICD-10-CM | POA: Diagnosis not present

## 2013-04-27 DIAGNOSIS — R682 Dry mouth, unspecified: Secondary | ICD-10-CM | POA: Insufficient documentation

## 2013-04-27 MED ORDER — AMLODIPINE BESYLATE 5 MG PO TABS: 10.0000 mg | ORAL_TABLET | Freq: Every day | ORAL | Status: DC

## 2013-04-27 MED ORDER — BIOTENE MOISTURIZING MOUTH MT SOLN: 10.0000 mL | Freq: Four times a day (QID) | OROMUCOSAL | Status: DC | PRN

## 2013-04-27 MED ORDER — METOCLOPRAMIDE HCL 5 MG PO TABS: 2.5000 mg | ORAL_TABLET | Freq: Three times a day (TID) | ORAL | Status: DC

## 2013-04-27 NOTE — Progress Notes (Signed)
Patient ID: Shannon Gomez, female   DOB: April 05, 1914, 79 y.o.   MRN: [id number]     [facility]  Allergies  Allergen Reactions  . Sulfonamide Derivatives      Chief Complaint  Patient presents with  . Medical Managment of Chronic Issues    HPI:  She is being seen for the management of her chronic illnesses. There are no concerns being voiced by the nursing staff at this time. She is complaining of a dry mouth. Her blood pressure has been elevated. She is complaining of dry mouth. She has also had to utilize her standing orders for constipation in the past month.    Past Medical History  Diagnosis Date  . Thyroid disease   . Hypertension   . GERD (gastroesophageal reflux disease)   . Vitamin D deficiency   . Dementia   . Chronic pain   . Chronic kidney disease     No past surgical history on file.  VITAL SIGNS BP 154/78  Pulse 57  Ht 5' (1.524 m)  Wt 133 lb 9.6 oz (60.601 kg)  BMI 26.09 kg/m2   Patient's Medications  New Prescriptions   No medications on file  Previous Medications   AMLODIPINE (NORVASC) 5 MG TABLET    Take 5 mg by mouth daily.   CALCIUM CARBONATE (TUMS - DOSED IN MG ELEMENTAL CALCIUM) 500 MG CHEWABLE TABLET    Chew 1 tablet by mouth 3 (three) times daily.   CHOLECALCIFEROL (VITAMIN D) 1000 UNITS TABLET    Take 1,000 Units by mouth daily.   CLONIDINE (CATAPRES) 0.1 MG TABLET    Take 0.1 mg by mouth daily.   FUROSEMIDE (LASIX) 20 MG TABLET    Take 20 mg by mouth.   LATANOPROST (XALATAN) 0.005 % OPHTHALMIC SOLUTION    Place 1 drop into both eyes at bedtime.   LEVOTHYROXINE (SYNTHROID, LEVOTHROID) 25 MCG TABLET    Take 25 mcg by mouth daily before breakfast.   LISINOPRIL (PRINIVIL,ZESTRIL) 20 MG TABLET    Take 20 mg by mouth daily.   MEMANTINE HCL ER (NAMENDA XR) 28 MG CP24    Take 28 mg by mouth daily.   METOCLOPRAMIDE (REGLAN) 5 MG TABLET    Take 2.5 mg by mouth 4 (four) times daily.   MORPHINE (MS CONTIN) 15 MG 12 HR TABLET    Take one  tablet by mouth every 12 hours for chronic pain for osteoarthritis. Do not crush   MULTIPLE VITAMIN (MULTIVITAMIN) TABLET    Take 1 tablet by mouth daily.   OMEPRAZOLE (PRILOSEC) 20 MG CAPSULE    Take 20 mg by mouth daily.   SENNOSIDES-DOCUSATE SODIUM (SENOKOT-S) 8.6-50 MG TABLET    Take 2 tablets by mouth daily.  Modified Medications   No medications on file  Discontinued Medications   No medications on file    SIGNIFICANT DIAGNOSTIC EXAMS    LABS REVIEWED:  05-19-12: wbc 12.1; hgb 12.7; hct 39.1; mcv 95.6; plt 22; tsh 1.295 07-02-12: wbc 11.0; hgb 12.6; hct 37.0; mcv 89.4; plt 189; glucose 121; bun 39; creat 1.37; k+ 3.8;  Na++ 137 09-17-12: glucose 91; bun 40; creat 1.43; k+ 4.3; na++ 136; vit d 41  01-22-13: tsh 1.983; free t3: 2.5 03-10-13: vit d 40.15    Review of Systems  Constitutional: Negative for malaise/fatigue.       Dry mouth  Respiratory: Negative for cough and shortness of breath.   Cardiovascular: Negative for chest pain, palpitations and leg swelling.  Gastrointestinal:  Positive for constipation. Negative for heartburn.  Musculoskeletal: Negative for joint pain and myalgias.  Skin: Negative.   Neurological: Negative for dizziness and headaches.  Psychiatric/Behavioral: Negative for depression. The patient is not nervous/anxious.     Physical Exam  Constitutional: She appears well-developed and well-nourished. No distress.  Neck: Neck supple. No JVD present.  Cardiovascular: Normal rate, regular rhythm and intact distal pulses.   Respiratory: Effort normal and breath sounds normal. No respiratory distress. She has no wheezes.  GI: Soft. Bowel sounds are normal. She exhibits no distension.  Musculoskeletal: Normal range of motion. She exhibits no edema.  Neurological: She is alert.  Skin: Skin is warm and dry. She is not diaphoretic.     ASSESSMENT/ PLAN:  1. Hypertension: is worse; will increase norvasc to 10 mg daily; will continue lisinopril 20 mg  daily will continue clonidine 0.1 mg daily and will have nursing check blood pressure twice daily for one week and reports.   2. Genella Rife; will continue prilosec 20 mg daily and will reduce her reglan to 2.5 mg tid ac meals and will monitor her status   3. Constipation: will increase senna s to 2 tabs twice daily and will monitor  4. Edema: is stable will continue lasix 20 mg daily   5. Hypothyroidism: will continue synthroid 25 mcg daily   6. Dementia: is without change in status will continue  namenda xr 28 mg daily and will monitor  7. Osteoarthritis: no complaints of pain present will continue ms contin 15 mg twice daily;   8. Glaucoma: will continue xalatan to both eyes nightly   9. Dry mouth: will begin biotene 10 cc four times daily as needed and will monitor   Will check cbc; cmp next draw  Time spent with patient 45 minutes.

## 2013-04-30 DIAGNOSIS — D649 Anemia, unspecified: Secondary | ICD-10-CM | POA: Diagnosis not present

## 2013-05-02 ENCOUNTER — Other Ambulatory Visit: Payer: Self-pay

## 2013-05-02 DIAGNOSIS — N390 Urinary tract infection, site not specified: Secondary | ICD-10-CM | POA: Diagnosis not present

## 2013-05-02 DIAGNOSIS — R05 Cough: Secondary | ICD-10-CM | POA: Diagnosis not present

## 2013-05-02 LAB — URINALYSIS, COMPLETE
Ph: 5 (ref 4.5–8.0)
Protein: 100
RBC,UR: 4 /HPF (ref 0–5)
Specific Gravity: 1.013 (ref 1.003–1.030)
Squamous Epithelial: NONE SEEN
WBC UR: NONE SEEN /HPF (ref 0–5)

## 2013-05-03 ENCOUNTER — Non-Acute Institutional Stay (SKILLED_NURSING_FACILITY): Payer: Medicare Other | Admitting: Adult Health

## 2013-05-03 ENCOUNTER — Encounter: Payer: Self-pay | Admitting: Adult Health

## 2013-05-03 DIAGNOSIS — J189 Pneumonia, unspecified organism: Secondary | ICD-10-CM

## 2013-05-03 NOTE — Progress Notes (Signed)
Patient ID: Shannon Gomez, female   DOB: 12-18-1913, 79 y.o.   MRN: [id number]     [facility]  Allergies  Allergen Reactions  . Sulfonamide Derivatives      Chief Complaint  Patient presents with  . Acute Visit    follow up chest x-ray     HPI:  She had a routine cbc drawn on 04-28-13 which demonstrated a wbc of 18.4. She is a poor historian and cannot fully participate in the hpi or ros. She had a workup done for her elevated white count including a chest x-ray and u/a. The chest x-ray does demonstrate a right sided pneumonia for which she has been started on avelox for 10 days.  There are no reports of fever present. She tells me that she feels "better".   Past Medical History  Diagnosis Date  . Thyroid disease   . Hypertension   . GERD (gastroesophageal reflux disease)   . Vitamin D deficiency   . Dementia   . Chronic pain   . Chronic kidney disease     No past surgical history on file.  VITAL SIGNS BP 102/58  Pulse 62  Ht 5' (1.524 m)  Wt 133 lb 9.6 oz (60.601 kg)  BMI 26.09 kg/m2   Patient's Medications  New Prescriptions   No medications on file  Previous Medications   AMLODIPINE (NORVASC) 5 MG TABLET    Take 2 tablets (10 mg total) by mouth daily.   ARTIFICIAL SALIVA (BIOTENE MOISTURIZING MOUTH) SOLN    Use as directed 10 sprays in the mouth or throat 4 (four) times daily as needed.   CALCIUM CARBONATE (TUMS - DOSED IN MG ELEMENTAL CALCIUM) 500 MG CHEWABLE TABLET    Chew 1 tablet by mouth 3 (three) times daily.   CHOLECALCIFEROL (VITAMIN D) 1000 UNITS TABLET    Take 1,000 Units by mouth daily.   CLONIDINE (CATAPRES) 0.1 MG TABLET    Take 0.1 mg by mouth daily.   FUROSEMIDE (LASIX) 20 MG TABLET    Take 20 mg by mouth.   LATANOPROST (XALATAN) 0.005 % OPHTHALMIC SOLUTION    Place 1 drop into both eyes at bedtime.   LEVOTHYROXINE (SYNTHROID, LEVOTHROID) 25 MCG TABLET    Take 25 mcg by mouth daily before breakfast.   LISINOPRIL (PRINIVIL,ZESTRIL) 20 MG  TABLET    Take 20 mg by mouth daily.   MEMANTINE HCL ER (NAMENDA XR) 28 MG CP24    Take 28 mg by mouth daily.   METOCLOPRAMIDE (REGLAN) 5 MG TABLET    Take 0.5 tablets (2.5 mg total) by mouth 3 (three) times daily before meals.   MORPHINE (MS CONTIN) 15 MG 12 HR TABLET    Take one tablet by mouth every 12 hours for chronic pain for osteoarthritis. Do not crush   MULTIPLE VITAMIN (MULTIVITAMIN) TABLET    Take 1 tablet by mouth daily.   OMEPRAZOLE (PRILOSEC) 20 MG CAPSULE    Take 20 mg by mouth daily.   SENNOSIDES-DOCUSATE SODIUM (SENOKOT-S) 8.6-50 MG TABLET    Take 2 tablets by mouth daily.  Modified Medications   No medications on file  Discontinued Medications   No medications on file    SIGNIFICANT DIAGNOSTIC TESTS  05-02-13: chest x-ray: infiltrate right perihilar area most consistent with pneumonia: avelox   LABS REVIEWED:  05-19-12: wbc 12.1; hgb 12.7; hct 39.1; mcv 95.6; plt 22; tsh 1.295 07-02-12: wbc 11.0; hgb 12.6; hct 37.0; mcv 89.4; plt 189; glucose 121; bun 39; creat  1.37; k+ 3.8;  Na++ 137 09-17-12: glucose 91; bun 40; creat 1.43; k+ 4.3; na++ 136; vit d 41  01-22-13: tsh 1.983; free t3: 2.5 03-10-13: vit d 40.15 04-28-13: wbc 18.4; hgb 13.0; hct 41.3; mcv 93; plt 276 05-02-13: u/a: neg     Review of Systems  Constitutional: Negative for malaise/fatigue.       Dry mouth  Respiratory: Negative for cough and shortness of breath.   Cardiovascular: Negative for chest pain, palpitations and leg swelling.  Gastrointestinal: Positive for constipation. Negative for heartburn.  Musculoskeletal: Negative for joint pain and myalgias.  Skin: Negative.   Neurological: Negative for dizziness and headaches.  Psychiatric/Behavioral: Negative for depression. The patient is not nervous/anxious.     Physical Exam  Constitutional: She appears well-developed and well-nourished. No distress.  Neck: Neck supple. No JVD present.  Cardiovascular: Normal rate, regular rhythm and intact  distal pulses.   Respiratory: Effort normal and breath sounds diminished. No respiratory distress. She has no wheezes.  GI: Soft. Bowel sounds are normal. She exhibits no distension.  Musculoskeletal: Normal range of motion. She exhibits no edema.  Neurological: She is alert.  Skin: Skin is warm and dry. She is not diaphoretic.     ASSESSMENT/ PLAN:   1. Pneumonia: will complete her avelox as prescribed for 10 days; will being florastor twice daily for 10 days as probiotic and will monitor her status.

## 2013-05-06 LAB — URINE CULTURE

## 2013-05-08 LAB — BASIC METABOLIC PANEL
BUN: 42 mg/dL — AB (ref 4–21)
Creatinine: 1.1 mg/dL (ref 0.5–1.1)
GLUCOSE: 100 mg/dL
Potassium: 4.3 mmol/L (ref 3.4–5.3)
Sodium: 139 mmol/L (ref 137–147)

## 2013-05-10 ENCOUNTER — Other Ambulatory Visit: Payer: Self-pay | Admitting: *Deleted

## 2013-05-10 MED ORDER — MORPHINE SULFATE ER 15 MG PO TBCR: EXTENDED_RELEASE_TABLET | ORAL | Status: DC

## 2013-05-13 ENCOUNTER — Non-Acute Institutional Stay (SKILLED_NURSING_FACILITY): Payer: Medicare Other | Admitting: Adult Health

## 2013-05-13 DIAGNOSIS — L8990 Pressure ulcer of unspecified site, unspecified stage: Secondary | ICD-10-CM

## 2013-05-13 DIAGNOSIS — J449 Chronic obstructive pulmonary disease, unspecified: Secondary | ICD-10-CM | POA: Diagnosis not present

## 2013-05-13 DIAGNOSIS — J4489 Other specified chronic obstructive pulmonary disease: Secondary | ICD-10-CM

## 2013-05-13 DIAGNOSIS — I10 Essential (primary) hypertension: Secondary | ICD-10-CM

## 2013-05-13 DIAGNOSIS — J189 Pneumonia, unspecified organism: Secondary | ICD-10-CM | POA: Diagnosis not present

## 2013-05-13 DIAGNOSIS — F0390 Unspecified dementia without behavioral disturbance: Secondary | ICD-10-CM | POA: Diagnosis not present

## 2013-05-13 DIAGNOSIS — L8996 Pressure-induced deep tissue damage of unspecified site: Secondary | ICD-10-CM

## 2013-05-17 ENCOUNTER — Encounter: Payer: Self-pay | Admitting: Adult Health

## 2013-05-17 DIAGNOSIS — J189 Pneumonia, unspecified organism: Secondary | ICD-10-CM | POA: Insufficient documentation

## 2013-05-17 HISTORY — DX: Pneumonia, unspecified organism: J18.9

## 2013-05-17 NOTE — Progress Notes (Signed)
Patient ID: Shannon Gomez, female   DOB: 04-06-1914, 80 y.o.   MRN: [id number]     [facility]  Allergies  Allergen Reactions  . Sulfonamide Derivatives     Chief Complaint  Patient presents with  . Acute Visit    change in status     HPI:  She is being seen as nursing is concerned about her overall status. She is spending nearly all of her time in bed; her appetite is declined. She is less responsive. She has developed a deep tissue injury to her sacral area. She is able to answer some questions; however; I am concerned about whether or not she is actually answering. I have spoken with her RP; who also feels as though she has declined; is interested in a palliative care consult. Her blood pressure readings have been low she is presently being treated for pneumonia.   Past Medical History  Diagnosis Date  . Thyroid disease   . Hypertension   . GERD (gastroesophageal reflux disease)   . Vitamin D deficiency   . Dementia   . Chronic pain   . Chronic kidney disease     No past surgical history on file.  VITAL SIGNS BP 110/62  Pulse 50  Ht 5' (1.524 m)  Wt 123 lb (55.792 kg)  BMI 24.02 kg/m2   Patient's Medications  New Prescriptions   No medications on file  Previous Medications   AMLODIPINE (NORVASC) 5 MG TABLET    Take 2 tablets (10 mg total) by mouth daily.   ARTIFICIAL SALIVA (BIOTENE MOISTURIZING MOUTH) SOLN    Use as directed 10 sprays in the mouth or throat 4 (four) times daily as needed.   CALCIUM CARBONATE (TUMS - DOSED IN MG ELEMENTAL CALCIUM) 500 MG CHEWABLE TABLET    Chew 1 tablet by mouth 3 (three) times daily.   CHOLECALCIFEROL (VITAMIN D) 1000 UNITS TABLET    Take 1,000 Units by mouth daily.   CLONIDINE (CATAPRES) 0.1 MG TABLET    Take 0.1 mg by mouth daily.   FUROSEMIDE (LASIX) 20 MG TABLET    Take 20 mg by mouth.   LATANOPROST (XALATAN) 0.005 % OPHTHALMIC SOLUTION    Place 1 drop into both eyes at bedtime.   LEVOTHYROXINE (SYNTHROID, LEVOTHROID)  25 MCG TABLET    Take 25 mcg by mouth daily before breakfast.   LISINOPRIL (PRINIVIL,ZESTRIL) 20 MG TABLET    Take 20 mg by mouth daily.   MEMANTINE HCL ER (NAMENDA XR) 28 MG CP24    Take 28 mg by mouth daily.   METOCLOPRAMIDE (REGLAN) 5 MG TABLET    Take 0.5 tablets (2.5 mg total) by mouth 3 (three) times daily before meals.   MORPHINE (MS CONTIN) 15 MG 12 HR TABLET    Take one tablet by mouth every 12 hours for chronic pain for osteoarthritis. Do not crush   MULTIPLE VITAMIN (MULTIVITAMIN) TABLET    Take 1 tablet by mouth daily.   OMEPRAZOLE (PRILOSEC) 20 MG CAPSULE    Take 20 mg by mouth daily.   SENNOSIDES-DOCUSATE SODIUM (SENOKOT-S) 8.6-50 MG TABLET    Take 2 tablets by mouth daily.  Modified Medications   No medications on file  Discontinued Medications   No medications on file    SIGNIFICANT DIAGNOSTIC EXAMS  05-02-13: chest x-ray: infiltrate right perihilar area most consistent with pneumonia: avelox   LABS REVIEWED:  05-19-12: wbc 12.1; hgb 12.7; hct 39.1; mcv 95.6; plt 22; tsh 1.295 07-02-12: wbc 11.0; hgb  12.6; hct 37.0; mcv 89.4; plt 189; glucose 121; bun 39; creat 1.37; k+ 3.8;  Na++ 137 09-17-12: glucose 91; bun 40; creat 1.43; k+ 4.3; na++ 136; vit d 41  01-22-13: tsh 1.983; free t3: 2.5 03-10-13: vit d 40.15 04-28-13: wbc 18.4; hgb 13.0; hct 41.3; mcv 93; plt 276 05-02-13: u/a: neg     Review of Systems  Constitutional: Negative for malaise/fatigue.  Respiratory: Negative for cough and shortness of breath.   Cardiovascular: Negative for chest pain, palpitations and leg swelling.  Gastrointestinal: negative for constpation. Negative for heartburn.  Musculoskeletal: Negative for joint pain and myalgias.  Skin: Negative.   Neurological: Negative for dizziness and headaches.  Psychiatric/Behavioral: Negative for depression. The patient is not nervous/anxious.     Physical Exam  Constitutional: She appears well-developed and well-nourished. No distress.  Neck: Neck  supple. No JVD present.  Cardiovascular: Normal rate, regular rhythm and intact distal pulses.   Respiratory: Effort normal and breath sounds diminished. No respiratory distress. She has no wheezes.  GI: Soft. Bowel sounds are normal. She exhibits no distension.  Musculoskeletal: Normal range of motion. She exhibits no edema.  Neurological: She is alert.  Skin: Skin is warm and dry. She is not diaphoretic. has a deep tissue injury to her sacral area with no open areas present; no signs of infection present.    ASSESSMENT/ PLAN:  1. Pneumonia: will continue her current treatment and will continue to monitor her status  2. Hypertension: will stop the norvasc and clonidine. Will hold her lisinopril for systolic b/p <110. Will monitor her status   3. Copd: no significant change in her status will not change medications; will setup a palliative care consult.   4. Dementia: she is experiencing a decline in her status; she is less verbally responsive; has a deep tissue injury and will monitor her status.   Time spent with patient 45 minutes.

## 2013-05-26 ENCOUNTER — Encounter: Payer: Self-pay | Admitting: Internal Medicine

## 2013-05-26 DIAGNOSIS — G8929 Other chronic pain: Secondary | ICD-10-CM | POA: Diagnosis not present

## 2013-05-26 DIAGNOSIS — R627 Adult failure to thrive: Secondary | ICD-10-CM | POA: Diagnosis not present

## 2013-05-27 ENCOUNTER — Non-Acute Institutional Stay (SKILLED_NURSING_FACILITY): Payer: Medicare Other | Admitting: Adult Health

## 2013-05-27 DIAGNOSIS — F0390 Unspecified dementia without behavioral disturbance: Secondary | ICD-10-CM

## 2013-05-27 DIAGNOSIS — J449 Chronic obstructive pulmonary disease, unspecified: Secondary | ICD-10-CM

## 2013-05-27 DIAGNOSIS — R627 Adult failure to thrive: Secondary | ICD-10-CM

## 2013-05-28 DIAGNOSIS — G309 Alzheimer's disease, unspecified: Secondary | ICD-10-CM | POA: Diagnosis not present

## 2013-05-28 DIAGNOSIS — F0280 Dementia in other diseases classified elsewhere without behavioral disturbance: Secondary | ICD-10-CM | POA: Diagnosis not present

## 2013-05-30 ENCOUNTER — Encounter: Payer: Self-pay | Admitting: Adult Health

## 2013-05-30 DIAGNOSIS — R627 Adult failure to thrive: Secondary | ICD-10-CM | POA: Insufficient documentation

## 2013-05-30 NOTE — Progress Notes (Signed)
Patient ID: Shannon Gomez, female   DOB: 1913/06/01, 80 y.o.   MRN: [id number]     [facility]  Allergies  Allergen Reactions  . Sulfonamide Derivatives      Chief Complaint  Patient presents with  . Acute Visit    change in status     HPI: She has continued to decline since having pneumonia. She is not eating or drinking well. She has deep tissue injuries; and has lost weight over the past month. She has been seen by palliative care; who suggests hospice care. Her family is in agreement with hospice care at this time. She is unable to participate in the hpi or ros.    Past Medical History  Diagnosis Date  . Thyroid disease   . Hypertension   . GERD (gastroesophageal reflux disease)   . Vitamin D deficiency   . Dementia   . Chronic pain   . Chronic kidney disease     No past surgical history on file.  VITAL SIGNS BP 110/75  Pulse 65  Ht 5' (1.524 m)  Wt 123 lb (55.792 kg)  BMI 24.02 kg/m2   Patient's Medications  New Prescriptions   No medications on file  Previous Medications   ARTIFICIAL SALIVA (BIOTENE MOISTURIZING MOUTH) SOLN    Use as directed 10 sprays in the mouth or throat 4 (four) times daily as needed.   CALCIUM CARBONATE (TUMS - DOSED IN MG ELEMENTAL CALCIUM) 500 MG CHEWABLE TABLET    Chew 1 tablet by mouth 3 (three) times daily.   CHOLECALCIFEROL (VITAMIN D) 1000 UNITS TABLET    Take 1,000 Units by mouth daily.   FUROSEMIDE (LASIX) 20 MG TABLET    Take 20 mg by mouth.   LATANOPROST (XALATAN) 0.005 % OPHTHALMIC SOLUTION    Place 1 drop into both eyes at bedtime.   LEVOTHYROXINE (SYNTHROID, LEVOTHROID) 25 MCG TABLET    Take 25 mcg by mouth daily before breakfast.   LISINOPRIL (PRINIVIL,ZESTRIL) 20 MG TABLET    Take 20 mg by mouth daily.   MEMANTINE HCL ER (NAMENDA XR) 28 MG CP24    Take 28 mg by mouth daily.   METOCLOPRAMIDE (REGLAN) 5 MG TABLET    Take 0.5 tablets (2.5 mg total) by mouth 3 (three) times daily before meals.   MORPHINE (MS CONTIN)  15 MG 12 HR TABLET    Take one tablet by mouth every 12 hours for chronic pain for osteoarthritis. Do not crush   MULTIPLE VITAMIN (MULTIVITAMIN) TABLET    Take 1 tablet by mouth daily.   OMEPRAZOLE (PRILOSEC) 20 MG CAPSULE    Take 20 mg by mouth daily.   SENNOSIDES-DOCUSATE SODIUM (SENOKOT-S) 8.6-50 MG TABLET    Take 2 tablets by mouth daily.  Modified Medications   No medications on file  Discontinued Medications   No medications on file    SIGNIFICANT DIAGNOSTIC EXAMS  05-02-13: chest x-ray: infiltrate right perihilar area most consistent with pneumonia: avelox   LABS REVIEWED:  07-02-12: wbc 11.0; hgb 12.6; hct 37.0; mcv 89.4; plt 189; glucose 121; bun 39; creat 1.37; k+ 3.8;  Na++ 137 09-17-12: glucose 91; bun 40; creat 1.43; k+ 4.3; na++ 136; vit d 41  01-22-13: tsh 1.983; free t3: 2.5 03-10-13: vit d 40.15 04-28-13: wbc 18.4; hgb 13.0; hct 41.3; mcv 93; plt 276 05-02-13: u/a: neg    Review of Systems  Unable to perform ROS     Physical Exam  Constitutional: She appears well-developed and well-nourished. No  distress.  Neck: Neck supple. No JVD present.  Cardiovascular: Normal rate, regular rhythm and intact distal pulses.   Respiratory: Effort normal and breath sounds diminished. No respiratory distress. She has no wheezes.  GI: Soft. Bowel sounds are normal. She exhibits no distension.  Musculoskeletal: Normal range of motion. She exhibits no edema.  Neurological: She is alert.  Skin: Skin is warm and dry. She is not diaphoretic. has a deep tissue injury to her sacral area with no open areas present; no signs of infection present.      ASSESSMENT/ PLAN:  Dementia without behavioral issues; copd; ftt: will stop all medication; will begin roxanol 5 mg every 2 hours as needed for pain or distress and will setup a hospice consult. Will focus her care upon comfort. Will monitor her status  Time spent with patient 45 minutes.

## 2013-05-31 ENCOUNTER — Non-Acute Institutional Stay (SKILLED_NURSING_FACILITY): Payer: Medicare Other | Admitting: Adult Health

## 2013-05-31 DIAGNOSIS — G309 Alzheimer's disease, unspecified: Secondary | ICD-10-CM | POA: Diagnosis not present

## 2013-05-31 DIAGNOSIS — I10 Essential (primary) hypertension: Secondary | ICD-10-CM

## 2013-05-31 DIAGNOSIS — R627 Adult failure to thrive: Secondary | ICD-10-CM

## 2013-05-31 DIAGNOSIS — F0280 Dementia in other diseases classified elsewhere without behavioral disturbance: Secondary | ICD-10-CM | POA: Diagnosis not present

## 2013-05-31 DIAGNOSIS — F0390 Unspecified dementia without behavioral disturbance: Secondary | ICD-10-CM | POA: Diagnosis not present

## 2013-06-01 DIAGNOSIS — G309 Alzheimer's disease, unspecified: Secondary | ICD-10-CM | POA: Diagnosis not present

## 2013-06-01 DIAGNOSIS — F0390 Unspecified dementia without behavioral disturbance: Secondary | ICD-10-CM | POA: Diagnosis not present

## 2013-06-01 DIAGNOSIS — F0280 Dementia in other diseases classified elsewhere without behavioral disturbance: Secondary | ICD-10-CM | POA: Diagnosis not present

## 2013-06-02 ENCOUNTER — Non-Acute Institutional Stay (SKILLED_NURSING_FACILITY): Payer: Medicare Other | Admitting: Adult Health

## 2013-06-02 DIAGNOSIS — IMO0002 Reserved for concepts with insufficient information to code with codable children: Secondary | ICD-10-CM | POA: Diagnosis not present

## 2013-06-02 DIAGNOSIS — N390 Urinary tract infection, site not specified: Secondary | ICD-10-CM | POA: Diagnosis not present

## 2013-06-02 DIAGNOSIS — S31000A Unspecified open wound of lower back and pelvis without penetration into retroperitoneum, initial encounter: Secondary | ICD-10-CM

## 2013-06-02 DIAGNOSIS — F0280 Dementia in other diseases classified elsewhere without behavioral disturbance: Secondary | ICD-10-CM | POA: Diagnosis not present

## 2013-06-02 DIAGNOSIS — R627 Adult failure to thrive: Secondary | ICD-10-CM

## 2013-06-03 DIAGNOSIS — G309 Alzheimer's disease, unspecified: Secondary | ICD-10-CM | POA: Diagnosis not present

## 2013-06-03 DIAGNOSIS — F0280 Dementia in other diseases classified elsewhere without behavioral disturbance: Secondary | ICD-10-CM | POA: Diagnosis not present

## 2013-06-06 DIAGNOSIS — F0280 Dementia in other diseases classified elsewhere without behavioral disturbance: Secondary | ICD-10-CM | POA: Diagnosis not present

## 2013-06-06 DIAGNOSIS — G309 Alzheimer's disease, unspecified: Secondary | ICD-10-CM | POA: Diagnosis not present

## 2013-06-07 ENCOUNTER — Encounter: Payer: Self-pay | Admitting: Adult Health

## 2013-06-07 NOTE — Progress Notes (Signed)
Patient ID: Shannon Gomez, female   DOB: Dec 25, 1913, 80 y.o.   MRN: [id number]     [facility]  Allergies  Allergen Reactions  . Sulfonamide Derivatives      Chief Complaint  Patient presents with  . Medical Managment of Chronic Issues    HPI:  She is being seen for the management of her chronic illnesses. She is being followed by hospice care. She is unable to participate in the hpi or ros. There are no concerns being voiced by the nursing staff at this time. She continues to slowly decline overall.    Past Medical History  Diagnosis Date  . Thyroid disease   . Hypertension   . GERD (gastroesophageal reflux disease)   . Vitamin D deficiency   . Dementia   . Chronic pain   . Chronic kidney disease     No past surgical history on file.  VITAL SIGNS BP 110/62  Pulse 64  Ht 5' (1.524 m)  Wt 121 lb 9.6 oz (55.157 kg)  BMI 23.75 kg/m2   Patient's Medications  New Prescriptions   No medications on file  Previous Medications   ARTIFICIAL SALIVA (BIOTENE MOISTURIZING MOUTH) SOLN    Use as directed 10 sprays in the mouth or throat 4 (four) times daily as needed.   LATANOPROST (XALATAN) 0.005 % OPHTHALMIC SOLUTION    Place 1 drop into both eyes at bedtime.   MORPHINE (ROXANOL) 20 MG/ML CONCENTRATED SOLUTION    Take 5 mg by mouth every 2 (two) hours as needed for severe pain.   SENNOSIDES-DOCUSATE SODIUM (SENOKOT-S) 8.6-50 MG TABLET    Take 2 tablets by mouth daily.  Modified Medications   No medications on file  Discontinued Medications   CALCIUM CARBONATE (TUMS - DOSED IN MG ELEMENTAL CALCIUM) 500 MG CHEWABLE TABLET    Chew 1 tablet by mouth 3 (three) times daily.   CHOLECALCIFEROL (VITAMIN D) 1000 UNITS TABLET    Take 1,000 Units by mouth daily.   FUROSEMIDE (LASIX) 20 MG TABLET    Take 20 mg by mouth.   LEVOTHYROXINE (SYNTHROID, LEVOTHROID) 25 MCG TABLET    Take 25 mcg by mouth daily before breakfast.   LISINOPRIL (PRINIVIL,ZESTRIL) 20 MG TABLET    Take 20 mg  by mouth daily.   MEMANTINE HCL ER (NAMENDA XR) 28 MG CP24    Take 28 mg by mouth daily.   METOCLOPRAMIDE (REGLAN) 5 MG TABLET    Take 0.5 tablets (2.5 mg total) by mouth 3 (three) times daily before meals.   MORPHINE (MS CONTIN) 15 MG 12 HR TABLET    Take one tablet by mouth every 12 hours for chronic pain for osteoarthritis. Do not crush   MULTIPLE VITAMIN (MULTIVITAMIN) TABLET    Take 1 tablet by mouth daily.   OMEPRAZOLE (PRILOSEC) 20 MG CAPSULE    Take 20 mg by mouth daily.    SIGNIFICANT DIAGNOSTIC EXAMS  05-02-13: chest x-ray: infiltrate right perihilar area most consistent with pneumonia: avelox   LABS REVIEWED:  07-02-12: wbc 11.0; hgb 12.6; hct 37.0; mcv 89.4; plt 189; glucose 121; bun 39; creat 1.37; k+ 3.8;  Na++ 137 09-17-12: glucose 91; bun 40; creat 1.43; k+ 4.3; na++ 136; vit d 41  01-22-13: tsh 1.983; free t3: 2.5 03-10-13: vit d 40.15 04-28-13: wbc 18.4; hgb 13.0; hct 41.3; mcv 93; plt 276 05-02-13: u/a: neg    Review of Systems  Unable to perform ROS     Physical Exam  Constitutional: She  appears well-developed and well-nourished. No distress.  Neck: Neck supple. No JVD present.  Cardiovascular: Normal rate, regular rhythm and intact distal pulses.   Respiratory: Effort normal and breath sounds diminished. No respiratory distress. She has no wheezes.  GI: Soft. Bowel sounds are normal. She exhibits no distension.  Musculoskeletal: Normal range of motion. She exhibits no edema.  Neurological: She is alert.  Skin: Skin is warm and dry. She is not diaphoretic.    ASSESSMENT/ PLAN:  1. Dementia: she is off all medications at this time. She continues to decline; is nonverbal and did not make eye contact with me today. Will not make changes and will monitor her status.   2. FTT: she is followed by hospice care. The focus of her care is comfort only; will not make changes to her care at this time. She does have roxanol 5 mg every 2 hours as needed for pain and  will monitor her status.   3. Hypertension; is presently not on medications; will not make changes will monitor her status.

## 2013-06-07 NOTE — Progress Notes (Signed)
Patient ID: Shannon Gomez, female   DOB: 04/23/1914, 80 y.o.   MRN: [id number]     [facility]  Allergies  Allergen Reactions  . Sulfonamide Derivatives      Chief Complaint  Patient presents with  . Acute Visit    wound management     HPI:  She is being seen for her sacral wound management. The wound is improved. There are no signs of infection present. There is a small amount of slough present. The area was scraped. There are no indications of pain present. She is unable to participate in the hpi or ros.   Past Medical History  Diagnosis Date  . Thyroid disease   . Hypertension   . GERD (gastroesophageal reflux disease)   . Vitamin D deficiency   . Dementia   . Chronic pain   . Chronic kidney disease     No past surgical history on file.  VITAL SIGNS BP 110/60  Pulse 70  Ht 5' (1.524 m)  Wt 121 lb 9.6 oz (55.157 kg)  BMI 23.75 kg/m2   Patient's Medications  New Prescriptions   No medications on file  Previous Medications   ARTIFICIAL SALIVA (BIOTENE MOISTURIZING MOUTH) SOLN    Use as directed 10 sprays in the mouth or throat 4 (four) times daily as needed.   LATANOPROST (XALATAN) 0.005 % OPHTHALMIC SOLUTION    Place 1 drop into both eyes at bedtime.   MORPHINE (ROXANOL) 20 MG/ML CONCENTRATED SOLUTION    Take 5 mg by mouth every 2 (two) hours as needed for severe pain.   SENNOSIDES-DOCUSATE SODIUM (SENOKOT-S) 8.6-50 MG TABLET    Take 2 tablets by mouth daily.  Modified Medications   No medications on file  Discontinued Medications   No medications on file    SIGNIFICANT DIAGNOSTIC EXAMS  05-02-13: chest x-ray: infiltrate right perihilar area most consistent with pneumonia: avelox   LABS REVIEWED:  07-02-12: wbc 11.0; hgb 12.6; hct 37.0; mcv 89.4; plt 189; glucose 121; bun 39; creat 1.37; k+ 3.8;  Na++ 137 09-17-12: glucose 91; bun 40; creat 1.43; k+ 4.3; na++ 136; vit d 41  01-22-13: tsh 1.983; free t3: 2.5 03-10-13: vit d 40.15 04-28-13: wbc  18.4; hgb 13.0; hct 41.3; mcv 93; plt 276 05-02-13: u/a: neg    Review of Systems  Unable to perform ROS     Physical Exam  Constitutional: She appears well-developed and well-nourished. No distress.  Neck: Neck supple. No JVD present.  Cardiovascular: Normal rate, regular rhythm and intact distal pulses.   Respiratory: Effort normal and breath sounds diminished. No respiratory distress. She has no wheezes.  GI: Soft. Bowel sounds are normal. She exhibits no distension.  Musculoskeletal: Normal range of motion. She exhibits no edema.  Neurological: She is alert.  Skin: Skin is warm and dry. She is not diaphoretic.her sacral wound has nearly resolved. There are no signs of infection or inflammation present. The slough area was scraped.      ASSESSMENT/ PLAN:  1. Sacral wound: is resolving; will continue current treatment and will continue to monitor her status. The slough area was scraped.

## 2013-06-28 ENCOUNTER — Non-Acute Institutional Stay (SKILLED_NURSING_FACILITY): Payer: Medicare Other | Admitting: Adult Health

## 2013-06-28 ENCOUNTER — Encounter: Payer: Self-pay | Admitting: Adult Health

## 2013-06-28 DIAGNOSIS — I10 Essential (primary) hypertension: Secondary | ICD-10-CM

## 2013-06-28 DIAGNOSIS — R627 Adult failure to thrive: Secondary | ICD-10-CM

## 2013-06-28 DIAGNOSIS — F0390 Unspecified dementia without behavioral disturbance: Secondary | ICD-10-CM

## 2013-06-28 NOTE — Progress Notes (Signed)
Patient ID: Shannon FieldElizabeth N Falin, female   DOB: 05/14/1913, 80 y.o.   MRN: [id number]     [facility]  Allergies  Allergen Reactions  . Sulfonamide Derivatives      Chief Complaint  Patient presents with  . Annual Exam    HPI:  She is followed by hospice care. She has declined over the past year. Her routine medications were stopped with the focus upon her care to be comfort only. She is unable to fully participate in the hpi ros; but she is complaining of pain 'all over".  She is not a candidate for health maintenance tests.    Past Medical History  Diagnosis Date  . Thyroid disease   . Hypertension   . GERD (gastroesophageal reflux disease)   . Vitamin D deficiency   . Dementia   . Chronic pain   . Chronic kidney disease     No past surgical history on file.  VITAL SIGNS BP 92/61  Pulse 82  Ht 5' (1.524 m)  Wt 106 lb 3.2 oz (48.172 kg)  BMI 20.74 kg/m2   Patient's Medications  New Prescriptions   No medications on file  Previous Medications   ARTIFICIAL SALIVA (BIOTENE MOISTURIZING MOUTH) SOLN    Use as directed 10 sprays in the mouth or throat 4 (four) times daily as needed.   MORPHINE (ROXANOL) 20 MG/ML CONCENTRATED SOLUTION    Take 5 mg by mouth every 2 (two) hours as needed for severe pain.   SENNOSIDES-DOCUSATE SODIUM (SENOKOT-S) 8.6-50 MG TABLET    Take 2 tablets by mouth daily.  Modified Medications   No medications on file  Discontinued Medications   LATANOPROST (XALATAN) 0.005 % OPHTHALMIC SOLUTION    Place 1 drop into both eyes at bedtime.    SIGNIFICANT DIAGNOSTIC EXAMS   05-02-13: chest x-ray: infiltrate right perihilar area most consistent with pneumonia: avelox   LABS REVIEWED:  07-02-12: wbc 11.0; hgb 12.6; hct 37.0; mcv 89.4; plt 189; glucose 121; bun 39; creat 1.37; k+ 3.8;  Na++ 137 09-17-12: glucose 91; bun 40; creat 1.43; k+ 4.3; na++ 136; vit d 41  01-22-13: tsh 1.983; free t3: 2.5 03-10-13: vit d 40.15 04-28-13: wbc 18.4; hgb 13.0;  hct 41.3; mcv 93; plt 276 05-02-13: u/a: neg     Review of Systems  Unable to perform ROS     Physical Exam  Constitutional: She appears well-developed and well-nourished. No distress.  Neck: Neck supple. No JVD present.  Cardiovascular: Normal rate, regular rhythm and intact distal pulses.   Respiratory: Effort normal and breath sounds diminished. No respiratory distress. She has no wheezes.  GI: Soft. Bowel sounds are normal. She exhibits no distension.  Musculoskeletal: Normal range of motion. She exhibits no edema.  Neurological: She is alert.  Skin: Skin is warm and dry. She is not diaphoretic.    ASSESSMENT/ PLAN:  1. Dementia: she is off all medications at this time. She continues to decline; is nonverbal at times  but did  make eye contact with me today. Will not make changes and will monitor her status.   2. FTT: she is followed by hospice care. The focus of her care is comfort only; will not make changes to her care at this time. Will change her roxanol to 5 mg every 6 hours routinely and 5 mg every 2 hours as needed and will monitor her status.   3. Hypertension; is presently not on medications; will not make changes will monitor her status.  Ok Edwards NP [facility]  Contact [phone] Monday through Friday 8am- 5pm  After hours call [phone]

## 2013-06-29 ENCOUNTER — Other Ambulatory Visit: Payer: Self-pay | Admitting: *Deleted

## 2013-06-29 MED ORDER — MORPHINE SULFATE (CONCENTRATE) 20 MG/ML PO SOLN: ORAL | Status: DC

## 2013-06-29 NOTE — Telephone Encounter (Signed)
[facility] 

## 2013-07-04 DIAGNOSIS — G309 Alzheimer's disease, unspecified: Secondary | ICD-10-CM | POA: Diagnosis not present

## 2013-07-04 DIAGNOSIS — F0280 Dementia in other diseases classified elsewhere without behavioral disturbance: Secondary | ICD-10-CM | POA: Diagnosis not present

## 2013-07-22 ENCOUNTER — Non-Acute Institutional Stay (SKILLED_NURSING_FACILITY): Payer: Medicare Other | Admitting: Adult Health

## 2013-07-22 DIAGNOSIS — K5900 Constipation, unspecified: Secondary | ICD-10-CM

## 2013-07-22 DIAGNOSIS — R627 Adult failure to thrive: Secondary | ICD-10-CM | POA: Diagnosis not present

## 2013-07-22 DIAGNOSIS — F0390 Unspecified dementia without behavioral disturbance: Secondary | ICD-10-CM

## 2013-07-22 DIAGNOSIS — I10 Essential (primary) hypertension: Secondary | ICD-10-CM | POA: Diagnosis not present

## 2013-07-31 ENCOUNTER — Encounter: Payer: Self-pay | Admitting: Adult Health

## 2013-07-31 NOTE — Progress Notes (Signed)
Patient ID: Shannon FieldElizabeth N Gomez, female   DOB: 07/07/1913, 80 y.o.   MRN: [id number]     [facility]  Allergies  Allergen Reactions  . Sulfonamide Derivatives     Chief Complaint  Patient presents with  . Medical Managment of Chronic Issues    HPI:  She is being seen for the management of her chronic illnesses. She is followed by hospice care. She is nonverbal at times. Today she did not speak to me. She does not appear to be in any pain or discomfort. There are no concerns being voiced by the nursing staff at this time.    Past Medical History  Diagnosis Date  . Thyroid disease   . Hypertension   . GERD (gastroesophageal reflux disease)   . Vitamin D deficiency   . Dementia   . Chronic pain   . Chronic kidney disease     No past surgical history on file.  VITAL SIGNS BP 99/67  Pulse 68  Ht 5' (1.524 m)  Wt 106 lb 3.2 oz (48.172 kg)  BMI 20.74 kg/m2   Patient's Medications  New Prescriptions   No medications on file  Previous Medications   ARTIFICIAL SALIVA (BIOTENE MOISTURIZING MOUTH) SOLN    Use as directed 10 sprays in the mouth or throat 4 (four) times daily as needed.   MORPHINE (ROXANOL) 20 MG/ML CONCENTRATED SOLUTION    Give 0.5425ml by mouth every six hours; Give 0.825ml by mouth every 2 hours as needed for pain/distress   SENNOSIDES-DOCUSATE SODIUM (SENOKOT-S) 8.6-50 MG TABLET    Take 2 tablets by mouth daily.  Modified Medications   No medications on file  Discontinued Medications   No medications on file    SIGNIFICANT DIAGNOSTIC EXAMS   05-02-13: chest x-ray: infiltrate right perihilar area most consistent with pneumonia: avelox   LABS REVIEWED:  07-02-12: wbc 11.0; hgb 12.6; hct 37.0; mcv 89.4; plt 189; glucose 121; bun 39; creat 1.37; k+ 3.8;  Na++ 137 09-17-12: glucose 91; bun 40; creat 1.43; k+ 4.3; na++ 136; vit d 41  01-22-13: tsh 1.983; free t3: 2.5 03-10-13: vit d 40.15 04-28-13: wbc 18.4; hgb 13.0; hct 41.3; mcv 93; plt 276 05-02-13:  u/a: neg     Review of Systems  Unable to perform ROS     Physical Exam  Constitutional: She appears well-developed and well-nourished. No distress.  Neck: Neck supple. No JVD present.  Cardiovascular: Normal rate, regular rhythm and intact distal pulses.   Respiratory: Effort normal and breath sounds diminished. No respiratory distress. She has no wheezes.  GI: Soft. Bowel sounds are normal. She exhibits no distension.  Musculoskeletal: Normal range of motion. She exhibits no edema.  Neurological: She is alert.  Skin: Skin is warm and dry. She is not diaphoretic.    ASSESSMENT/ PLAN:  1. Dementia: she is off all medications at this time. She continues to decline; is nonverbal at times  but did  make eye contact with me today. Will not make changes and will monitor her status.   2. FTT: she is followed by hospice care. The focus of her care is comfort only; will not make changes to her care at this time. Will continue her roxanol at 5 mg every 6 hours routinely and 5 mg every 2 hours as needed and will monitor her status.   3. Hypertension; is presently not on medications; will not make changes will monitor her status.   4. Constipation: will continue senna s 2 tabs daily  Ok Edwards NP [facility]  Contact [phone] Monday through Friday 8am- 5pm  After hours call [phone]

## 2013-08-04 DIAGNOSIS — F0280 Dementia in other diseases classified elsewhere without behavioral disturbance: Secondary | ICD-10-CM | POA: Diagnosis not present

## 2013-08-23 ENCOUNTER — Non-Acute Institutional Stay (SKILLED_NURSING_FACILITY): Payer: Medicare Other | Admitting: Adult Health

## 2013-08-23 DIAGNOSIS — K5900 Constipation, unspecified: Secondary | ICD-10-CM

## 2013-08-23 DIAGNOSIS — R627 Adult failure to thrive: Secondary | ICD-10-CM | POA: Diagnosis not present

## 2013-08-23 DIAGNOSIS — I10 Essential (primary) hypertension: Secondary | ICD-10-CM

## 2013-08-23 DIAGNOSIS — F0390 Unspecified dementia without behavioral disturbance: Secondary | ICD-10-CM

## 2013-09-01 ENCOUNTER — Encounter: Payer: Self-pay | Admitting: Adult Health

## 2013-09-01 NOTE — Progress Notes (Signed)
Patient ID: Shannon Gomez, female   DOB: 12/25/1913, 80 y.o.   MRN: [id number]     [facility]  Allergies  Allergen Reactions  . Sulfonamide Derivatives      Chief Complaint  Patient presents with  . Medical Management of Chronic Issues    HPI:  She is being seen for the management of her chronic illnesses. She is followed by hospice care. There are no significant changes in her overall status. There are no concerns being voiced by the nursing staff at this time. She cannot fully participate in the hpi or ros.    Past Medical History  Diagnosis Date  . Thyroid disease   . Hypertension   . GERD (gastroesophageal reflux disease)   . Vitamin D deficiency   . Dementia   . Chronic pain   . Chronic kidney disease     No past surgical history on file.  VITAL SIGNS BP 124/62  Pulse 70  Ht 5' (1.524 m)  Wt 106 lb 3.2 oz (48.172 kg)  BMI 20.74 kg/m2   Patient's Medications  New Prescriptions   No medications on file  Previous Medications   ARTIFICIAL SALIVA (BIOTENE MOISTURIZING MOUTH) SOLN    Use as directed 10 sprays in the mouth or throat 4 (four) times daily as needed.   BISACODYL (DULCOLAX) 10 MG SUPPOSITORY    Place 10 mg rectally every other day.   MORPHINE (ROXANOL) 20 MG/ML CONCENTRATED SOLUTION    Give 0.6125ml by mouth every six hours; Give 0.6725ml by mouth every 2 hours as needed for pain/distress   SENNOSIDES-DOCUSATE SODIUM (SENOKOT-S) 8.6-50 MG TABLET    Take 2 tablets by mouth daily.  Modified Medications   No medications on file  Discontinued Medications   No medications on file    SIGNIFICANT DIAGNOSTIC EXAMS  05-02-13: chest x-ray: infiltrate right perihilar area most consistent with pneumonia: avelox   LABS REVIEWED:  09-17-12: glucose 91; bun 40; creat 1.43; k+ 4.3; na++ 136; vit d 41  01-22-13: tsh 1.983; free t3: 2.5 03-10-13: vit d 40.15 04-28-13: wbc 18.4; hgb 13.0; hct 41.3; mcv 93; plt 276 05-02-13: u/a: neg     Review of Systems    Unable to perform ROS     Physical Exam  Constitutional: She appears well-developed and well-nourished. No distress.  Neck: Neck supple. No JVD present.  Cardiovascular: Normal rate, regular rhythm and intact distal pulses.   Respiratory: Effort normal and breath sounds diminished. No respiratory distress. She has no wheezes.  GI: Soft. Bowel sounds are normal. She exhibits no distension.  Musculoskeletal: Normal range of motion. She exhibits no edema.  Neurological: She is alert.  Skin: Skin is warm and dry. She is not diaphoretic.    ASSESSMENT/ PLAN:  1. Dementia: she is off all medications at this time. She continues to decline; is nonverbal at times  but did  make eye contact with me today. Will not make changes and will monitor her status.   2. FTT: she is followed by hospice care. The focus of her care is comfort only; will not make changes to her care at this time. Will continue her roxanol at 5 mg every 6 hours routinely and 5 mg every 2 hours as needed and will monitor her status.   3. Hypertension; is presently not on medications; will not make changes will monitor her status.   4. Constipation: will continue senna s 2 tabs daily and dulcolax supp every 2 days.  Synthia Innocenteborah Green NP [facility]  Contact [phone] Monday through Friday 8am- 5pm  After hours call [phone]

## 2013-09-03 DIAGNOSIS — S31809A Unspecified open wound of unspecified buttock, initial encounter: Secondary | ICD-10-CM | POA: Diagnosis not present

## 2013-09-03 DIAGNOSIS — R634 Abnormal weight loss: Secondary | ICD-10-CM | POA: Diagnosis not present

## 2013-09-03 DIAGNOSIS — F0280 Dementia in other diseases classified elsewhere without behavioral disturbance: Secondary | ICD-10-CM | POA: Diagnosis not present

## 2013-09-03 DIAGNOSIS — R630 Anorexia: Secondary | ICD-10-CM | POA: Diagnosis not present

## 2013-09-03 DIAGNOSIS — G309 Alzheimer's disease, unspecified: Secondary | ICD-10-CM | POA: Diagnosis not present

## 2013-09-07 DIAGNOSIS — E559 Vitamin D deficiency, unspecified: Secondary | ICD-10-CM | POA: Diagnosis not present

## 2013-09-07 DIAGNOSIS — E875 Hyperkalemia: Secondary | ICD-10-CM | POA: Diagnosis not present

## 2013-09-17 LAB — BASIC METABOLIC PANEL
BUN: 28 mg/dL — AB (ref 4–21)
Creatinine: 1 mg/dL (ref 0.5–1.1)
Potassium: 3.8 mmol/L (ref 3.4–5.3)
SODIUM: 136 mmol/L — AB (ref 137–147)

## 2013-09-21 ENCOUNTER — Non-Acute Institutional Stay (SKILLED_NURSING_FACILITY): Payer: Medicare Other | Admitting: Adult Health

## 2013-09-21 DIAGNOSIS — K5900 Constipation, unspecified: Secondary | ICD-10-CM | POA: Diagnosis not present

## 2013-09-21 DIAGNOSIS — R627 Adult failure to thrive: Secondary | ICD-10-CM

## 2013-09-21 DIAGNOSIS — I10 Essential (primary) hypertension: Secondary | ICD-10-CM | POA: Diagnosis not present

## 2013-09-21 DIAGNOSIS — F0390 Unspecified dementia without behavioral disturbance: Secondary | ICD-10-CM | POA: Diagnosis not present

## 2013-09-22 ENCOUNTER — Other Ambulatory Visit: Payer: Self-pay | Admitting: *Deleted

## 2013-09-22 MED ORDER — MORPHINE SULFATE (CONCENTRATE) 20 MG/ML PO SOLN: ORAL | Status: DC

## 2013-09-22 NOTE — Telephone Encounter (Signed)
[facility] 

## 2013-10-01 ENCOUNTER — Encounter: Payer: Self-pay | Admitting: Adult Health

## 2013-10-01 NOTE — Progress Notes (Signed)
Patient ID: Shannon Gomez, female   DOB: Apr 06, 1914, 80 y.o.   MRN: [id number]     [facility]  Allergies  Allergen Reactions  . Sulfonamide Derivatives      Chief Complaint  Patient presents with  . Medical Management of Chronic Issues    HPI:  She is being seen for the management of her chronic illnesses. She is followed by hospice care. She does have increased confusion present. There are no concerns being voiced by the nursing staff at this time. She is unable to fully participate in the hpi or ros.    Past Medical History  Diagnosis Date  . Thyroid disease   . Hypertension   . GERD (gastroesophageal reflux disease)   . Vitamin D deficiency   . Dementia   . Chronic pain   . Chronic kidney disease   . HCAP (healthcare-associated pneumonia) 05/17/2013  . GASTRIC ULCER 01/01/2008    Qualifier: Diagnosis of  By: Andrey Campanile MD, Raliegh Ip    . DIVERTICULITIS, HX OF 01/01/2008    Qualifier: Diagnosis of  By: Andrey Campanile MD, Raliegh Ip    . COLONIC POLYPS, HX OF 01/01/2008    Qualifier: Diagnosis of  By: Andrey Campanile MD, LauraLee      No past surgical history on file.  VITAL SIGNS BP 146/60  Pulse 56  Ht 5' (1.524 m)  Wt 106 lb 3.2 oz (48.172 kg)  BMI 20.74 kg/m2   Patient's Medications  New Prescriptions   No medications on file  Previous Medications   ARTIFICIAL SALIVA (BIOTENE MOISTURIZING MOUTH) SOLN    Use as directed 10 sprays in the mouth or throat 4 (four) times daily as needed.   BISACODYL (DULCOLAX) 10 MG SUPPOSITORY    Place 10 mg rectally every other day.   MORPHINE (ROXANOL) 20 MG/ML CONCENTRATED SOLUTION    Give 0.58ml by mouth every 6 hours as needed for pain   SENNOSIDES-DOCUSATE SODIUM (SENOKOT-S) 8.6-50 MG TABLET    Take 2 tablets by mouth daily.  Modified Medications   No medications on file  Discontinued Medications   No medications on file    SIGNIFICANT DIAGNOSTIC EXAMS  05-02-13: chest x-ray: infiltrate right perihilar area most consistent with  pneumonia: avelox   LABS REVIEWED:  09-17-12: glucose 91; bun 40; creat 1.43; k+ 4.3; na++ 136; vit d 41  01-22-13: tsh 1.983; free t3: 2.5 03-10-13: vit d 40.15 04-28-13: wbc 18.4; hgb 13.0; hct 41.3; mcv 93; plt 276 05-02-13: u/a: neg  09-07-13: glucose 70; bun 28; creat 1.0; k+3.8; na++136     Review of Systems  Unable to perform ROS     Physical Exam  Constitutional: She appears well-developed and well-nourished. No distress.  Neck: Neck supple. No JVD present.  Cardiovascular: Normal rate, regular rhythm and intact distal pulses.   Respiratory: Effort normal and breath sounds diminished. No respiratory distress. She has no wheezes.  GI: Soft. Bowel sounds are normal. She exhibits no distension.  Musculoskeletal: Normal range of motion. She exhibits no edema.  Neurological: She is alert.  Skin: Skin is warm and dry. She is not diaphoretic.    ASSESSMENT/ PLAN:  1. Dementia: she is off all medications at this time. She continues to decline; is nonverbal at times  but did  make eye contact with me today. Will not make changes and will monitor her status.   2. FTT: she is followed by hospice care. The focus of her care is comfort only; will not make changes to her care  at this time. Will continue her roxanol at 5 mg every 6 hours routinely and 5 mg every 2 hours as needed and will monitor her status.   3. Hypertension; is presently not on medications; will not make changes will monitor her status.   4. Constipation: will continue senna s 2 tabs daily and dulcolax supp every 2 days.       Synthia Innocenteborah Alizey Noren NP [facility]  Contact [phone] Monday through Friday 8am- 5pm  After hours call [phone]

## 2013-10-04 DIAGNOSIS — G309 Alzheimer's disease, unspecified: Secondary | ICD-10-CM | POA: Diagnosis not present

## 2013-10-04 DIAGNOSIS — F0280 Dementia in other diseases classified elsewhere without behavioral disturbance: Secondary | ICD-10-CM | POA: Diagnosis not present

## 2013-10-04 DIAGNOSIS — R634 Abnormal weight loss: Secondary | ICD-10-CM | POA: Diagnosis not present

## 2013-10-04 DIAGNOSIS — R630 Anorexia: Secondary | ICD-10-CM | POA: Diagnosis not present

## 2013-10-04 DIAGNOSIS — S31809A Unspecified open wound of unspecified buttock, initial encounter: Secondary | ICD-10-CM | POA: Diagnosis not present

## 2013-10-14 ENCOUNTER — Other Ambulatory Visit: Payer: Self-pay | Admitting: *Deleted

## 2013-10-14 MED ORDER — MORPHINE SULFATE (CONCENTRATE) 20 MG/ML PO SOLN: ORAL | Status: DC

## 2013-10-19 DIAGNOSIS — M79609 Pain in unspecified limb: Secondary | ICD-10-CM | POA: Diagnosis not present

## 2013-10-19 DIAGNOSIS — F0390 Unspecified dementia without behavioral disturbance: Secondary | ICD-10-CM | POA: Diagnosis not present

## 2013-10-19 DIAGNOSIS — B351 Tinea unguium: Secondary | ICD-10-CM | POA: Diagnosis not present

## 2013-10-19 DIAGNOSIS — M19079 Primary osteoarthritis, unspecified ankle and foot: Secondary | ICD-10-CM | POA: Diagnosis not present

## 2013-10-22 ENCOUNTER — Non-Acute Institutional Stay (SKILLED_NURSING_FACILITY): Payer: Medicare Other | Admitting: Adult Health

## 2013-10-22 DIAGNOSIS — K5909 Other constipation: Secondary | ICD-10-CM | POA: Diagnosis not present

## 2013-10-22 DIAGNOSIS — F0390 Unspecified dementia without behavioral disturbance: Secondary | ICD-10-CM | POA: Diagnosis not present

## 2013-10-22 DIAGNOSIS — R627 Adult failure to thrive: Secondary | ICD-10-CM | POA: Diagnosis not present

## 2013-10-22 DIAGNOSIS — I10 Essential (primary) hypertension: Secondary | ICD-10-CM | POA: Diagnosis not present

## 2013-10-31 ENCOUNTER — Encounter: Payer: Self-pay | Admitting: Adult Health

## 2013-10-31 NOTE — Progress Notes (Signed)
Patient ID: Shannon Gomez, female   DOB: 10/05/1913, 80 y.o.   MRN: [id number]     [facility]  Allergies  Allergen Reactions  . Sulfonamide Derivatives      Chief Complaint  Patient presents with  . Medical Management of Chronic Issues    HPI:  She is being seen for the management of her chronic illnesses. She is followed by hospice care. There are no concerns being voiced by the nursing staff at this time. She is not able to fully participate in the hpi or ros; but tells me that she is feeling good today.    Past Medical History  Diagnosis Date  . Thyroid disease   . Hypertension   . GERD (gastroesophageal reflux disease)   . Vitamin D deficiency   . Dementia   . Chronic pain   . Chronic kidney disease   . HCAP (healthcare-associated pneumonia) 05/17/2013  . GASTRIC ULCER 01/01/2008    Qualifier: Diagnosis of  By: Andrey CampanileWilson MD, Raliegh IpLauraLee    . DIVERTICULITIS, HX OF 01/01/2008    Qualifier: Diagnosis of  By: Andrey CampanileWilson MD, Raliegh IpLauraLee    . COLONIC POLYPS, HX OF 01/01/2008    Qualifier: Diagnosis of  By: Andrey CampanileWilson MD, LauraLee      No past surgical history on file.  VITAL SIGNS BP 146/84  Pulse 60  Ht 5' (1.524 m)  Wt 121 lb 3.2 oz (54.976 kg)  BMI 23.67 kg/m2   Patient's Medications  New Prescriptions   No medications on file  Previous Medications   ARTIFICIAL SALIVA (BIOTENE MOISTURIZING MOUTH) SOLN    Use as directed 10 sprays in the mouth or throat 4 (four) times daily as needed.   BISACODYL (DULCOLAX) 10 MG SUPPOSITORY    Place 10 mg rectally every other day.   MORPHINE (ROXANOL) 20 MG/ML CONCENTRATED SOLUTION    Give 0.825ml by mouth every 6 hours as needed for pain   SENNOSIDES-DOCUSATE SODIUM (SENOKOT-S) 8.6-50 MG TABLET    Take 2 tablets by mouth daily.  Modified Medications   No medications on file  Discontinued Medications   No medications on file    SIGNIFICANT DIAGNOSTIC EXAMS  05-02-13: chest x-ray: infiltrate right perihilar area most consistent with  pneumonia: avelox   LABS REVIEWED:  09-17-12: glucose 91; bun 40; creat 1.43; k+ 4.3; na++ 136; vit d 41  01-22-13: tsh 1.983; free t3: 2.5 03-10-13: vit d 40.15 04-28-13: wbc 18.4; hgb 13.0; hct 41.3; mcv 93; plt 276 05-02-13: u/a: neg  09-07-13: glucose 70; bun 28; creat 1.0; k+3.8; na++136     Review of Systems  Unable to perform ROS     Physical Exam  Constitutional: She appears well-developed and well-nourished. No distress.  Neck: Neck supple. No JVD present.  Cardiovascular: Normal rate, regular rhythm and intact distal pulses.   Respiratory: Effort normal and breath sounds diminished. No respiratory distress. She has no wheezes.  GI: Soft. Bowel sounds are normal. She exhibits no distension.  Musculoskeletal: Normal range of motion. She exhibits no edema.  Neurological: She is alert.  Skin: Skin is warm and dry. She is not diaphoretic.    ASSESSMENT/ PLAN:  1. Dementia: she is off all medications at this time. She continues to decline. Will not make changes and will monitor her status.   2. FTT: she is followed by hospice care. The focus of her care is comfort only; will not make changes to her care at this time. Will continue her roxanol at 5 mg  every 6 hours routinely and will monitor her status.   3. Hypertension; is presently not on medications; will not make changes will monitor her status.   4. Constipation: will continue senna s 2 tabs daily and dulcolax supp every 2 days.       Synthia Innocenteborah Green NP [facility]  Contact [phone] Monday through Friday 8am- 5pm  After hours call [phone]

## 2013-11-03 DIAGNOSIS — S31809A Unspecified open wound of unspecified buttock, initial encounter: Secondary | ICD-10-CM | POA: Diagnosis not present

## 2013-11-03 DIAGNOSIS — F0280 Dementia in other diseases classified elsewhere without behavioral disturbance: Secondary | ICD-10-CM | POA: Diagnosis not present

## 2013-11-03 DIAGNOSIS — G309 Alzheimer's disease, unspecified: Secondary | ICD-10-CM | POA: Diagnosis not present

## 2013-11-03 DIAGNOSIS — R634 Abnormal weight loss: Secondary | ICD-10-CM | POA: Diagnosis not present

## 2013-11-03 DIAGNOSIS — R630 Anorexia: Secondary | ICD-10-CM | POA: Diagnosis not present

## 2013-11-04 DIAGNOSIS — G309 Alzheimer's disease, unspecified: Secondary | ICD-10-CM | POA: Diagnosis not present

## 2013-11-04 DIAGNOSIS — R634 Abnormal weight loss: Secondary | ICD-10-CM | POA: Diagnosis not present

## 2013-11-04 DIAGNOSIS — R630 Anorexia: Secondary | ICD-10-CM | POA: Diagnosis not present

## 2013-11-04 DIAGNOSIS — S31809A Unspecified open wound of unspecified buttock, initial encounter: Secondary | ICD-10-CM | POA: Diagnosis not present

## 2013-11-04 DIAGNOSIS — F0280 Dementia in other diseases classified elsewhere without behavioral disturbance: Secondary | ICD-10-CM | POA: Diagnosis not present

## 2013-11-05 DIAGNOSIS — S31809A Unspecified open wound of unspecified buttock, initial encounter: Secondary | ICD-10-CM | POA: Diagnosis not present

## 2013-11-05 DIAGNOSIS — R630 Anorexia: Secondary | ICD-10-CM | POA: Diagnosis not present

## 2013-11-05 DIAGNOSIS — F0280 Dementia in other diseases classified elsewhere without behavioral disturbance: Secondary | ICD-10-CM | POA: Diagnosis not present

## 2013-11-05 DIAGNOSIS — R634 Abnormal weight loss: Secondary | ICD-10-CM | POA: Diagnosis not present

## 2013-11-05 DIAGNOSIS — G309 Alzheimer's disease, unspecified: Secondary | ICD-10-CM | POA: Diagnosis not present

## 2013-11-09 ENCOUNTER — Telehealth: Payer: Self-pay

## 2013-11-09 NOTE — Telephone Encounter (Signed)
FYI ONLY: Patient is no longer under hospice care

## 2013-11-09 NOTE — Telephone Encounter (Signed)
Noted. Can you let Wyatt PortelaDebbie Green know as well as FYI- just forward this

## 2013-11-19 ENCOUNTER — Non-Acute Institutional Stay (SKILLED_NURSING_FACILITY): Payer: Medicare Other | Admitting: Adult Health

## 2013-11-19 DIAGNOSIS — F0390 Unspecified dementia without behavioral disturbance: Secondary | ICD-10-CM

## 2013-11-19 DIAGNOSIS — I10 Essential (primary) hypertension: Secondary | ICD-10-CM | POA: Diagnosis not present

## 2013-11-19 DIAGNOSIS — R627 Adult failure to thrive: Secondary | ICD-10-CM | POA: Diagnosis not present

## 2013-11-19 DIAGNOSIS — K5909 Other constipation: Secondary | ICD-10-CM

## 2013-11-22 DIAGNOSIS — N390 Urinary tract infection, site not specified: Secondary | ICD-10-CM | POA: Diagnosis not present

## 2013-11-29 ENCOUNTER — Encounter: Payer: Self-pay | Admitting: Adult Health

## 2013-11-29 DIAGNOSIS — I10 Essential (primary) hypertension: Secondary | ICD-10-CM | POA: Insufficient documentation

## 2013-11-29 NOTE — Progress Notes (Signed)
Patient ID: Shannon Gomez, female   DOB: 06/17/1913, 80 y.o.   MRN: [id number]     [facility]  Allergies  Allergen Reactions  . Sulfonamide Derivatives      Chief Complaint  Patient presents with  . Medical Management of Chronic Issues    HPI:  She is being seen for the management of her chronic illnesses. Overall her status remains stable. She is not longer followed by hospice care. She cannot fully participate in the hpi or ros. There are no concerns being voiced by the nursing staff at this time.    Past Medical History  Diagnosis Date  . Thyroid disease   . Hypertension   . GERD (gastroesophageal reflux disease)   . Vitamin D deficiency   . Dementia   . Chronic pain   . Chronic kidney disease   . HCAP (healthcare-associated pneumonia) 05/17/2013  . GASTRIC ULCER 01/01/2008    Qualifier: Diagnosis of  By: Andrey CampanileWilson MD, Raliegh IpLauraLee    . DIVERTICULITIS, HX OF 01/01/2008    Qualifier: Diagnosis of  By: Andrey CampanileWilson MD, Raliegh IpLauraLee    . COLONIC POLYPS, HX OF 01/01/2008    Qualifier: Diagnosis of  By: Andrey CampanileWilson MD, LauraLee      No past surgical history on file.  VITAL SIGNS BP 128/73  Pulse 85  Ht 5' (1.524 m)  Wt 120 lb 9.6 oz (54.704 kg)  BMI 23.55 kg/m2   Patient's Medications  New Prescriptions   No medications on file  Previous Medications   ARTIFICIAL SALIVA (BIOTENE MOISTURIZING MOUTH) SOLN    Use as directed 10 sprays in the mouth or throat 4 (four) times daily as needed.   BISACODYL (DULCOLAX) 10 MG SUPPOSITORY    Place 10 mg rectally every other day.   MORPHINE (ROXANOL) 20 MG/ML CONCENTRATED SOLUTION    Give 0.4025ml by mouth every 6 hours as needed for pain   SENNOSIDES-DOCUSATE SODIUM (SENOKOT-S) 8.6-50 MG TABLET    Take 2 tablets by mouth daily.  Modified Medications   No medications on file  Discontinued Medications   No medications on file    SIGNIFICANT DIAGNOSTIC EXAMS  05-02-13: chest x-ray: infiltrate right perihilar area most consistent with  pneumonia: avelox   LABS REVIEWED:  09-17-12: glucose 91; bun 40; creat 1.43; k+ 4.3; na++ 136; vit d 41  01-22-13: tsh 1.983; free t3: 2.5 03-10-13: vit d 40.15 04-28-13: wbc 18.4; hgb 13.0; hct 41.3; mcv 93; plt 276 05-02-13: u/a: neg  09-07-13: glucose 70; bun 28; creat 1.0; k+3.8; na++136  Vit d 36.52     Review of Systems  Unable to perform ROS     Physical Exam  Constitutional: She appears well-developed and well-nourished. No distress.  Neck: Neck supple. No JVD present.  Cardiovascular: Normal rate, regular rhythm and intact distal pulses.   Respiratory: Effort normal and breath sounds diminished. No respiratory distress. She has no wheezes.  GI: Soft. Bowel sounds are normal. She exhibits no distension.  Musculoskeletal: Normal range of motion. She exhibits no edema.  Neurological: She is alert.  Skin: Skin is warm and dry. She is not diaphoretic.    ASSESSMENT/ PLAN:  1. Dementia: she is off all medications at this time. She continues to decline. Will not make changes and will monitor her status.  Her current body weight is 120.6 pounds.   2. FTT: she is no longer followed by hospice care. The focus of her care is comfort only; will not make changes to her care at  this time. Will continue her roxanol at 5 mg every 6 hours routinely and will monitor her status.   3. Hypertension; is presently not on medications; will not make changes will monitor her status.   4. Constipation: will continue senna s 2 tabs daily and dulcolax supp every 2 days as needed.

## 2013-12-16 ENCOUNTER — Non-Acute Institutional Stay (SKILLED_NURSING_FACILITY): Payer: Medicare Other | Admitting: Internal Medicine

## 2013-12-16 ENCOUNTER — Encounter: Payer: Self-pay | Admitting: Internal Medicine

## 2013-12-16 DIAGNOSIS — I10 Essential (primary) hypertension: Secondary | ICD-10-CM

## 2013-12-16 DIAGNOSIS — I2510 Atherosclerotic heart disease of native coronary artery without angina pectoris: Secondary | ICD-10-CM

## 2013-12-16 DIAGNOSIS — H11149 Conjunctival xerosis, unspecified, unspecified eye: Secondary | ICD-10-CM

## 2013-12-16 DIAGNOSIS — F0390 Unspecified dementia without behavioral disturbance: Secondary | ICD-10-CM

## 2013-12-16 DIAGNOSIS — K5909 Other constipation: Secondary | ICD-10-CM

## 2013-12-16 DIAGNOSIS — E507 Other ocular manifestations of vitamin A deficiency: Secondary | ICD-10-CM | POA: Insufficient documentation

## 2013-12-16 NOTE — Progress Notes (Signed)
Patient ID: Shannon Gomez, female   DOB: 11/29/1913, 80 y.o.   MRN: [id number]    Chief Complaint  Patient presents with  . Medical Management of Chronic Issues   Allergies  Allergen Reactions  . Sulfonamide Derivatives    HPI 80 y/o female patient is here for long term care. She was under hospice care until a month back. She has history of CAD, HTN, COPD, dementia, OA and constipation. She is currently off all medications and remains symptom free. No new concerns from staff. No falls reported. No new skin concern. Difficult to obtain HPI and ROS with her dementia  ROS Unable to obtain  Past Medical History  Diagnosis Date  . Thyroid disease   . Hypertension   . GERD (gastroesophageal reflux disease)   . Vitamin D deficiency   . Dementia   . Chronic pain   . Chronic kidney disease   . HCAP (healthcare-associated pneumonia) 05/17/2013  . GASTRIC ULCER 01/01/2008    Qualifier: Diagnosis of  By: Andrey CampanileWilson MD, Raliegh IpLauraLee    . DIVERTICULITIS, HX OF 01/01/2008    Qualifier: Diagnosis of  By: Andrey CampanileWilson MD, Raliegh IpLauraLee    . COLONIC POLYPS, HX OF 01/01/2008    Qualifier: Diagnosis of  By: Andrey CampanileWilson MD, Raliegh IpLauraLee     Current Outpatient Prescriptions on File Prior to Visit  Medication Sig Dispense Refill  . Artificial Saliva (BIOTENE MOISTURIZING MOUTH) SOLN Use as directed 10 sprays in the mouth or throat 4 (four) times daily as needed.  240 mL  12  . bisacodyl (DULCOLAX) 10 MG suppository Place 10 mg rectally every other day.      . morphine (ROXANOL) 20 MG/ML concentrated solution Give 0.3325ml by mouth every 6 hours as needed for pain  120 mL  0  . sennosides-docusate sodium (SENOKOT-S) 8.6-50 MG tablet Take 2 tablets by mouth daily.       No current facility-administered medications on file prior to visit.   Physical exam BP 140/61  Pulse 60  Temp(Src) 97 F (36.1 C)  Resp 16  SpO2 96%  Constitutional: She appears well-developed and well-nourished. No distress.  Neck: Neck supple. No JVD  present.  Cardiovascular: Normal rate, regular rhythm and intact distal pulses.   Respiratory: Effort normal. No respiratory distress. She has no wheezes.  GI: Soft. Bowel sounds are normal. She exhibits no distension.  Musculoskeletal: Normal range of motion. She exhibits no edema.  Neurological: She is alert.  Skin: Skin is warm and dry. She is not diaphoretic.  Labs 09-17-12: glucose 91; bun 40; creat 1.43; k+ 4.3; na++ 136; vit d 41   01-22-13: tsh 1.983; free t3: 2.5 03-10-13: vit d 40.15 04-28-13: wbc 18.4; hgb 13.0; hct 41.3; mcv 93; plt 276 05-02-13: u/a: neg   09-07-13: glucose 70; bun 28; creat 1.0; k+3.8; na++136  Vit d 36.52   Assessment/plan  Dementia Stable, decline anticipated. Off all medications. Continue skin care, fall precautions and monitor weight and po intake.   HTN bp stable, off all medications, monitor clinically  CAD Remains chest pain free. Off all medications. In no distress. Change roxanol to 5 mg (0.25 ml) every 8h as needed for now. Goal is to wean her off it  Xerophthalmia Continue biotene eye drops  constipation Continue dulcolax suppository qod and daily senna s for now

## 2013-12-17 ENCOUNTER — Other Ambulatory Visit: Payer: Self-pay | Admitting: *Deleted

## 2013-12-17 MED ORDER — MORPHINE SULFATE (CONCENTRATE) 20 MG/ML PO SOLN: ORAL | Status: DC

## 2013-12-17 NOTE — Telephone Encounter (Signed)
[facility] 

## 2013-12-21 ENCOUNTER — Other Ambulatory Visit: Payer: Self-pay | Admitting: *Deleted

## 2013-12-21 MED ORDER — AMBULATORY NON FORMULARY MEDICATION: Status: DC

## 2013-12-21 NOTE — Telephone Encounter (Signed)
[facility] 

## 2014-01-22 LAB — TSH: TSH: 5.36 u[IU]/mL (ref <=5.90)

## 2014-01-24 ENCOUNTER — Non-Acute Institutional Stay (SKILLED_NURSING_FACILITY): Payer: Medicare Other | Admitting: Adult Health

## 2014-01-24 DIAGNOSIS — I10 Essential (primary) hypertension: Secondary | ICD-10-CM

## 2014-01-24 DIAGNOSIS — K5909 Other constipation: Secondary | ICD-10-CM

## 2014-01-24 DIAGNOSIS — F0390 Unspecified dementia without behavioral disturbance: Secondary | ICD-10-CM | POA: Diagnosis not present

## 2014-01-24 DIAGNOSIS — R627 Adult failure to thrive: Secondary | ICD-10-CM

## 2014-01-24 DIAGNOSIS — E039 Hypothyroidism, unspecified: Secondary | ICD-10-CM | POA: Diagnosis not present

## 2014-01-26 ENCOUNTER — Encounter: Payer: Self-pay | Admitting: Adult Health

## 2014-01-26 NOTE — Progress Notes (Signed)
Patient ID: Shannon Gomez, female   DOB: 03/04/1914, 80 y.o.   MRN: [id number]     [facility]  Allergies  Allergen Reactions  . Sulfonamide Derivatives      Chief Complaint  Patient presents with  . Medical Management of Chronic Issues    HPI:  She is a long term resident of this facility being seen for the management of her chronic illnesses. Overall there is little decline in her status. She is unable to fully participate in the hpi or ros. There are no concerns being voiced by the nursing staff at this time.    Past Medical History  Diagnosis Date  . Thyroid disease   . Hypertension   . GERD (gastroesophageal reflux disease)   . Vitamin D deficiency   . Dementia   . Chronic pain   . Chronic kidney disease   . HCAP (healthcare-associated pneumonia) 05/17/2013  . GASTRIC ULCER 01/01/2008    Qualifier: Diagnosis of  By: Andrey Campanile MD, Raliegh Ip    . DIVERTICULITIS, HX OF 01/01/2008    Qualifier: Diagnosis of  By: Andrey Campanile MD, Raliegh Ip    . COLONIC POLYPS, HX OF 01/01/2008    Qualifier: Diagnosis of  By: Andrey Campanile MD, LauraLee      No past surgical history on file.  VITAL SIGNS BP 138/88  Pulse 70  Ht 5' (1.524 m)  Wt 120 lb 6.4 oz (54.613 kg)  BMI 23.51 kg/m2   Patient's Medications  New Prescriptions   No medications on file  Previous Medications   AMBULATORY NON FORMULARY MEDICATION    Morphine Sul Sol 100/90ml Sig: Take 0.83ml by mouth/under tongue every 8 hours as needed for pain/shortness of breath   ARTIFICIAL SALIVA (BIOTENE MOISTURIZING MOUTH) SOLN    Use as directed 10 sprays in the mouth or throat 4 (four) times daily as needed.   BISACODYL (DULCOLAX) 10 MG SUPPOSITORY    Place 10 mg rectally every other day.   MORPHINE (ROXANOL) 20 MG/ML CONCENTRATED SOLUTION    Give 0.68ml by mouth/under tongue every 8 hours as needed for pain/shortness of breath   SENNOSIDES-DOCUSATE SODIUM (SENOKOT-S) 8.6-50 MG TABLET    Take 2 tablets by mouth daily.  Modified  Medications   No medications on file  Discontinued Medications   No medications on file    SIGNIFICANT DIAGNOSTIC EXAMS   05-02-13: chest x-ray: infiltrate right perihilar area most consistent with pneumonia: avelox   LABS REVIEWED:  09-17-12: glucose 91; bun 40; creat 1.43; k+ 4.3; na++ 136; vit d 41  01-22-13: tsh 1.983; free t3: 2.5 03-10-13: vit d 40.15 04-28-13: wbc 18.4; hgb 13.0; hct 41.3; mcv 93; plt 276 05-02-13: u/a: neg  09-07-13: glucose 70; bun 28; creat 1.0; k+3.8; na++136  Vit d 36.52     Review of Systems  Unable to perform ROS     Physical Exam  Constitutional: She appears well-developed and well-nourished. No distress.  Neck: Neck supple. No JVD present.  Cardiovascular: Normal rate, regular rhythm and intact distal pulses.   Respiratory: Effort normal and breath sounds diminished. No respiratory distress. She has no wheezes.  GI: Soft. Bowel sounds are normal. She exhibits no distension.  Musculoskeletal: Normal range of motion. She exhibits no edema.  Neurological: She is alert.  Skin: Skin is warm and dry. She is not diaphoretic.    ASSESSMENT/ PLAN:  1. Dementia: she is off all medications at this time. She continues to decline. Will not make changes and will monitor her status.  Her current body weight is 120.4 pounds.   2. FTT: she is no longer followed by hospice care. The focus of her care is comfort only; will not make changes to her care at this time. Will continue her roxanol at 5 mg every 8 hours as needed  3. Hypertension; is presently not on medications; will not make changes will monitor her status.   4. Constipation: will continue senna s 2 tabs daily and dulcolax supp every 2 days as needed.

## 2014-02-18 ENCOUNTER — Non-Acute Institutional Stay (SKILLED_NURSING_FACILITY): Payer: Medicare Other | Admitting: Adult Health

## 2014-02-18 DIAGNOSIS — F0390 Unspecified dementia without behavioral disturbance: Secondary | ICD-10-CM | POA: Diagnosis not present

## 2014-02-18 DIAGNOSIS — I10 Essential (primary) hypertension: Secondary | ICD-10-CM

## 2014-02-18 DIAGNOSIS — R627 Adult failure to thrive: Secondary | ICD-10-CM

## 2014-02-18 DIAGNOSIS — K5909 Other constipation: Secondary | ICD-10-CM

## 2014-03-01 ENCOUNTER — Encounter: Payer: Self-pay | Admitting: Adult Health

## 2014-03-01 NOTE — Progress Notes (Signed)
Patient ID: Shannon Gomez, female   DOB: 07/11/1913, 80 y.o.   MRN: [id number]     [facility]  Allergies  Allergen Reactions  . Sulfonamide Derivatives      Chief Complaint  Patient presents with  . Medical Management of Chronic Issues    HPI:  She is a long term resident of this facility being seen for the management of her chronic ilnesses. overall her status remains without significant change. She states that she is feeling good. She is unable to fully participate in the hpi or ros. There are no nursing concerns being voiced at this time.    Past Medical History  Diagnosis Date  . Thyroid disease   . Hypertension   . GERD (gastroesophageal reflux disease)   . Vitamin D deficiency   . Dementia   . Chronic pain   . Chronic kidney disease   . HCAP (healthcare-associated pneumonia) 05/17/2013  . GASTRIC ULCER 01/01/2008    Qualifier: Diagnosis of  By: Andrey CampanileWilson MD, Raliegh IpLauraLee    . DIVERTICULITIS, HX OF 01/01/2008    Qualifier: Diagnosis of  By: Andrey CampanileWilson MD, Raliegh IpLauraLee    . COLONIC POLYPS, HX OF 01/01/2008    Qualifier: Diagnosis of  By: Andrey CampanileWilson MD, LauraLee      No past surgical history on file.  VITAL SIGNS BP 132/70  Pulse 67  Ht 5' (1.524 m)  Wt 120 lb (54.432 kg)  BMI 23.44 kg/m2   Patient's Medications  New Prescriptions   No medications on file  Previous Medications   ARTIFICIAL SALIVA (BIOTENE MOISTURIZING MOUTH) SOLN    Use as directed 10 sprays in the mouth or throat 4 (four) times daily as needed.   BISACODYL (DULCOLAX) 10 MG SUPPOSITORY    Place 10 mg rectally every other day.   MORPHINE (ROXANOL) 20 MG/ML CONCENTRATED SOLUTION    Give 0.5025ml by mouth/under tongue every 8 hours as needed for pain/shortness of breath   SENNOSIDES-DOCUSATE SODIUM (SENOKOT-S) 8.6-50 MG TABLET    Take 2 tablets by mouth daily.  Modified Medications   No medications on file  Discontinued Medications   AMBULATORY NON FORMULARY MEDICATION    Morphine Sul Sol 100/605ml Sig: Take  0.2325ml by mouth/under tongue every 8 hours as needed for pain/shortness of breath    SIGNIFICANT DIAGNOSTIC EXAMS  05-02-13: chest x-ray: infiltrate right perihilar area most consistent with pneumonia: avelox   LABS REVIEWED:   03-10-13: vit d 40.15 04-28-13: wbc 18.4; hgb 13.0; hct 41.3; mcv 93; plt 276 05-02-13: u/a: neg  09-07-13: glucose 70; bun 28; creat 1.0; k+3.8; na++136  Vit d 36.52  01-22-14: tsh 5.36; free t3: 2.9     Review of Systems  Unable to perform ROS     Physical Exam  Constitutional: She appears well-developed and well-nourished. No distress.  Neck: Neck supple. No JVD present.  Cardiovascular: Normal rate, regular rhythm and intact distal pulses.   Respiratory: Effort normal and breath sounds diminished. No respiratory distress. She has no wheezes.  GI: Soft. Bowel sounds are normal. She exhibits no distension.  Musculoskeletal: Normal range of motion. She exhibits no edema.  Neurological: She is alert.  Skin: Skin is warm and dry. She is not diaphoretic.    ASSESSMENT/ PLAN:  1. Dementia: she is off all medications at this time. She continues to decline. Will not make changes and will monitor her status.  Her current body weight is 120. pounds.   2. FTT: she is no longer followed by hospice  care. The focus of her care is comfort only; will not make changes to her care at this time. Will continue her roxanol at 5 mg every 8 hours as needed  3. Hypertension; is presently not on medications; will not make changes will monitor her status.   4. Constipation: will continue senna s 2 tabs daily and dulcolax supp every 2 days as needed.

## 2014-03-05 DIAGNOSIS — Z23 Encounter for immunization: Secondary | ICD-10-CM | POA: Diagnosis not present

## 2014-03-08 LAB — BASIC METABOLIC PANEL
BUN: 42 mg/dL — AB (ref 4–21)
CREATININE: 1.1 mg/dL (ref 0.5–1.1)
GLUCOSE: 100 mg/dL
Potassium: 4.3 mmol/L (ref 3.4–5.3)
Sodium: 139 mmol/L (ref 137–147)

## 2014-03-10 DIAGNOSIS — E559 Vitamin D deficiency, unspecified: Secondary | ICD-10-CM | POA: Diagnosis not present

## 2014-03-10 DIAGNOSIS — E875 Hyperkalemia: Secondary | ICD-10-CM | POA: Diagnosis not present

## 2014-03-14 ENCOUNTER — Non-Acute Institutional Stay (SKILLED_NURSING_FACILITY): Payer: Medicare Other | Admitting: Adult Health

## 2014-03-14 DIAGNOSIS — K5909 Other constipation: Secondary | ICD-10-CM | POA: Diagnosis not present

## 2014-03-14 DIAGNOSIS — R627 Adult failure to thrive: Secondary | ICD-10-CM | POA: Diagnosis not present

## 2014-03-14 DIAGNOSIS — F0390 Unspecified dementia without behavioral disturbance: Secondary | ICD-10-CM | POA: Diagnosis not present

## 2014-03-14 DIAGNOSIS — I10 Essential (primary) hypertension: Secondary | ICD-10-CM | POA: Diagnosis not present

## 2014-03-22 ENCOUNTER — Encounter: Payer: Self-pay | Admitting: Adult Health

## 2014-03-22 NOTE — Progress Notes (Signed)
Patient ID: Shannon Gomez, female   DOB: 11/30/1913, 80 y.o.   MRN: [id number]    Shannon Gomez    Allergies  Allergen Reactions  . Sulfonamide Derivatives        Chief Complaint  Patient presents with  . Medical Management of Chronic Issues    HPI:  She is a long term resident of this facility being seen for the management of her chronic illnesses. She is without significant change in her status. She denies any significant pain. She is unable to fully participate in the hpi or ros. There are no nursing concerns being voiced at this time.    Past Medical History  Diagnosis Date  . Thyroid disease   . Hypertension   . GERD (gastroesophageal reflux disease)   . Vitamin D deficiency   . Dementia   . Chronic pain   . Chronic kidney disease   . HCAP (healthcare-associated pneumonia) 05/17/2013  . GASTRIC ULCER 01/01/2008    Qualifier: Diagnosis of  By: Shannon CampanileWilson MD, Shannon Gomez    . DIVERTICULITIS, HX OF 01/01/2008    Qualifier: Diagnosis of  By: Shannon CampanileWilson MD, Shannon Gomez    . COLONIC POLYPS, HX OF 01/01/2008    Qualifier: Diagnosis of  By: Shannon CampanileWilson MD, Shannon Gomez      No past surgical history on file.  VITAL SIGNS BP 130/64 mmHg  Pulse 96  Ht 5' (1.524 m)  Wt 123 lb 6.4 oz (55.974 kg)  BMI 24.10 kg/m2  SpO2 97%   Outpatient Encounter Prescriptions as of 03/14/2014  Medication Sig  . Artificial Saliva (BIOTENE MOISTURIZING MOUTH) SOLN Use as directed 10 sprays in the mouth or throat 4 (four) times daily as needed.  . bisacodyl (DULCOLAX) 10 MG suppository Gomez 10 mg rectally every other day.  . morphine (ROXANOL) 20 MG/ML concentrated solution Give 0.7125ml by mouth/under tongue every 8 hours as needed for pain/shortness of breath  . sennosides-docusate sodium (SENOKOT-S) 8.6-50 MG tablet Take 2 tablets by mouth daily.     SIGNIFICANT DIAGNOSTIC EXAMS  05-02-13: chest x-ray: infiltrate right perihilar area most consistent with pneumonia: avelox   LABS REVIEWED:  03-10-13:  vit d 40.15 04-28-13: wbc 18.4; hgb 13.0; hct 41.3; mcv 93; plt 276 05-02-13: u/a: neg  09-07-13: glucose 70; bun 28; creat 1.0; k+3.8; na++136  Vit d 36.52  01-22-14: tsh 5.36; free t3: 2.9    Review of Systems  Unable to perform ROS    Physical Exam  Constitutional: She appears well-developed and well-nourished. No distress.  Neck: Neck supple. No JVD present.  Cardiovascular: Normal rate, regular rhythm and intact distal pulses.   Respiratory: Effort normal and breath sounds diminished. No respiratory distress. She has no wheezes.  GI: Soft. Bowel sounds are normal. She exhibits no distension.  Musculoskeletal: Normal range of motion. She exhibits no edema.  Neurological: She is alert.  Skin: Skin is warm and dry. She is not diaphoretic.    ASSESSMENT/ PLAN:  1. Dementia: she is off all medications at this time. She continues to decline. Will not make changes and will monitor her status.  Her current body weight is 120. pounds.   2. FTT: she is no longer followed by hospice care. The focus of her care is comfort only; will not make changes to her care at this time.   3. Hypertension; is presently not on medications; will not make changes will monitor her status.   4. Constipation: will continue senna s 2 tabs daily and dulcolax supp every  2 days as needed.       Shannon Innocenteborah Hafiz Irion NP [facility]  Contact [phone] Monday through Friday 8am- 5pm  After hours call [phone]

## 2014-04-13 ENCOUNTER — Non-Acute Institutional Stay (SKILLED_NURSING_FACILITY): Payer: Medicare Other | Admitting: Registered Nurse

## 2014-04-13 ENCOUNTER — Encounter: Payer: Self-pay | Admitting: Registered Nurse

## 2014-04-13 DIAGNOSIS — G894 Chronic pain syndrome: Secondary | ICD-10-CM

## 2014-04-13 DIAGNOSIS — R682 Dry mouth, unspecified: Secondary | ICD-10-CM | POA: Diagnosis not present

## 2014-04-13 DIAGNOSIS — K5900 Constipation, unspecified: Secondary | ICD-10-CM | POA: Diagnosis not present

## 2014-04-13 DIAGNOSIS — F0390 Unspecified dementia without behavioral disturbance: Secondary | ICD-10-CM | POA: Diagnosis not present

## 2014-04-13 NOTE — Progress Notes (Signed)
Patient ID: Shannon Gomez, female   DOB: 07/15/1913, 80 y.o.   MRN: [id number]   Place of Service: [facility]  Allergies  Allergen Reactions  . Sulfonamide Derivatives     Code Status: DNR  Goals of Care: Comfort and Quality of Life/LTC  Chief Complaint  Patient presents with  . Medical Management of Chronic Issues    dementia, chronic pain, constipation, dry mouth    HPI 80 y.o. female with PMH of hypothyroidism, HTN, CAD, chronic pain, dementia among others is being seen for a routine visit for management of her chronic issues. She was followed by hospice service until 11/2013. Weight stable. No recent fall or skin concerns reported. No change in behaviors or functional status reported. No concerns from staff. No complaints verbalized from patient. Patient stated that she is "not a complainer". Reported having good appetite and bowel movement. Pain is adequately managed with roxanol.   Review of Systems Constitutional: Negative for fever and chills HENT: Negative for ear pain, congestion, and sore throat Eyes: Negative for eye pain, eye discharge, and visual disturbance  Cardiovascular: Negative for chest pain, palpitations, and leg swelling Respiratory: Negative cough, shortness of breath, and wheezing.  Gastrointestinal: Negative for nausea and vomiting. Negative for abdominal pain, diarrhea and constipation.  Genitourinary: Negative for  dysuria Musculoskeletal: Positive for generalized pain  Neurological: Negative for dizziness and headache.  Skin: Negative for rash and wound.   Psychiatric: Negative for depression  Past Medical History  Diagnosis Date  . Thyroid disease   . Hypertension   . GERD (gastroesophageal reflux disease)   . Vitamin D deficiency   . Dementia   . Chronic pain   . Chronic kidney disease   . HCAP (healthcare-associated pneumonia) 05/17/2013  . GASTRIC ULCER 01/01/2008    Qualifier: Diagnosis of  By: Andrey CampanileWilson MD, Raliegh IpLauraLee    .  DIVERTICULITIS, HX OF 01/01/2008    Qualifier: Diagnosis of  By: Andrey CampanileWilson MD, Raliegh IpLauraLee    . COLONIC POLYPS, HX OF 01/01/2008    Qualifier: Diagnosis of  By: Andrey CampanileWilson MD, LauraLee      No past surgical history on file.  History   Social History  . Marital Status: Divorced    Spouse Name: N/A    Number of Children: N/A  . Years of Education: N/A   Occupational History  . Not on file.   Social History Main Topics  . Smoking status: Unknown If Ever Smoked  . Smokeless tobacco: Not on file  . Alcohol Use: Not on file  . Drug Use: Not on file  . Sexual Activity: Not on file   Other Topics Concern  . Not on file   Social History Narrative    No family history on file.    Medication List       This list is accurate as of: 04/13/14  9:38 PM.  Always use your most recent med list.               BIOTENE MOISTURIZING MOUTH Soln  Use as directed 10 sprays in the mouth or throat 4 (four) times daily as needed.     bisacodyl 10 MG suppository  Commonly known as:  DULCOLAX  Place 10 mg rectally every other day.     morphine 20 MG/ML concentrated solution  Commonly known as:  ROXANOL  Give 0.725ml by mouth/under tongue every 8 hours as needed for pain/shortness of breath     sennosides-docusate sodium 8.6-50 MG tablet  Commonly  known as:  SENOKOT-S  Take 2 tablets by mouth daily.        Physical Exam  BP 148/65 mmHg  Pulse 71  Temp(Src) 97.8 F (36.6 C)  Resp 18  Ht 5' (1.524 m)  Wt 123 lb 3.2 oz (55.883 kg)  BMI 24.06 kg/m2  Constitutional: WDWN elderly female in no acute distress. Appears younger than stated age.  Conversant and very pleasant.  HEENT: Normocephalic and atraumatic. PERRL. EOM intact. No icterus. No nasal discharge or sinus tenderness. Oral mucosa moist. Posterior pharynx clear of any exudate or lesions. Eyeglasses in place.  Neck: Supple and nontender. No lymphadenopathy, masses, or thyromegaly. No JVD or carotid bruits. Cardiac: Normal S1, S2. RRR  without appreciable murmurs, rubs, or gallops. Distal pulses intact. No dependent edema.  Lungs: No respiratory distress. Breath sounds clear bilaterally without rales, rhonchi, or wheezes. Abdomen: Audible bowel sounds in all quadrants. Soft, nontender, nondistended.   Musculoskeletal: able to move all extremities. No joint erythema or tenderness.  Skin: Warm and dry. No rash noted. No erythema.  Neurological: Alert and oriented to person Psychiatric:  Appropriate mood and affect.   Labs Reviewed  CBC Latest Ref Rng 08/22/2009 08/21/2009 08/20/2009  WBC 4.0 - 10.5 K/uL 11.6(H) 11.7(H) 18.1(H)  Hemoglobin 12.0 - 15.0 g/dL 86.512.8 78.412.3 69.613.1  Hematocrit 36.0 - 46.0 % 36.5 34.7(L) 36.9  Platelets 150 - 400 K/uL 173 160 170    CMP Latest Ref Rng 09/17/2013 08/22/2009 08/21/2009  Glucose 70 - 99 mg/dL - 91 295(M104(H)  BUN 4 - 21 mg/dL 84(X28(A) 20 DELTA CHECK NOTED 32(H)  Creatinine 0.5 - 1.1 mg/dL 1.0 3.240.84 4.011.05  Sodium 027137 - 147 mmol/L 136(A) 135 133(L)  Potassium 3.4 - 5.3 mmol/L 3.8 3.9 4.1  Chloride 96 - 112 mEq/L - 100 97  CO2 19 - 32 mEq/L - 28 29  Calcium 8.4 - 10.5 mg/dL - 8.8 8.5  Total Protein 6.0 - 8.3 g/dL - - -  Total Bilirubin 0.3 - 1.2 mg/dL - - -  Alkaline Phos 39 - 117 U/L - - -  AST 0 - 37 U/L - - -  ALT 0 - 35 U/L - - -    Lab Results  Component Value Date   TSH 5.36 01/22/2014    Assessment & Plan 1. Constipation, unspecified constipation type Stable. Continue senna s 2 tabs daily and dulcolax 10mg  suppository every other day. Encourage increasing fluid intake. Continue to monitor  2. Dry mouth Continue biotene sprays four times daily and monitor  3. Dementia, without behavioral disturbance Stable. Off of meds. Continue to monitor for change in behaviors. Continue fall risk precautions  4. Chronic pain syndrome Stable. Continue roxinol 5mg  every eight hours as needed for pain. Continue to monitor.   Family/Staff Communication Plan of care discussed with resident and  nursing staff. Resident and nursing staff verbalized understanding and agree with plan of care. No additional questions or concerns reported.    Shannon BackKim Elisia Stepp, MSN, AGNP-C [facility] [address] [place], [place] [address] [phone] [8am-5pm] After hours: [phone]

## 2014-05-26 ENCOUNTER — Non-Acute Institutional Stay (SKILLED_NURSING_FACILITY): Payer: Medicare Other | Admitting: Registered Nurse

## 2014-05-26 DIAGNOSIS — E039 Hypothyroidism, unspecified: Secondary | ICD-10-CM

## 2014-05-26 DIAGNOSIS — R627 Adult failure to thrive: Secondary | ICD-10-CM | POA: Diagnosis not present

## 2014-05-26 DIAGNOSIS — E559 Vitamin D deficiency, unspecified: Secondary | ICD-10-CM

## 2014-05-26 DIAGNOSIS — N183 Chronic kidney disease, stage 3 unspecified: Secondary | ICD-10-CM

## 2014-05-26 DIAGNOSIS — F0390 Unspecified dementia without behavioral disturbance: Secondary | ICD-10-CM

## 2014-05-26 DIAGNOSIS — K5900 Constipation, unspecified: Secondary | ICD-10-CM

## 2014-05-26 DIAGNOSIS — G894 Chronic pain syndrome: Secondary | ICD-10-CM

## 2014-05-26 NOTE — Progress Notes (Signed)
Patient ID: Shannon Gomez, female   DOB: 11/30/1913, 81 y.o.   MRN: [id number]   [facility]  Allergies  Allergen Reactions  . Sulfonamide Derivatives     Code Status: DNR  Goals of Care: Comfort and Quality of Life/LTC  Chief Complaint  Patient presents with  . Medical Management of Chronic Issues    dementia, chronic pain, constipation, hypothyroidism, vit D def    HPI 81 y.o. female with PMH of hypothyroidism, HTN, CAD, chronic pain, dementia among others is being seen for a routine visit for management of her chronic issues. She was followed by hospice service until 11/2013. Has 13 lbs weight gain over the past month. No recent fall or skin concerns reported. No change in behaviors or functional status reported. No concerns from staff. Chronic pain is well controlled on roxanol as needed. Constipation is stable with senna s and dulcolax prn. She is currently not on any medications for hypothyroidism or vit D deficiency. Seen in room today. Reported feeling fatigue and tired-would like to have something to help with her energy level. Otherwise, no complaints reported.  Review of Systems Constitutional: Negative for fever and chills. Positive for fatigue and malaise HENT: Negative for ear pain, congestion, and sore throat Eyes: Negative for eye pain, eye discharge, and visual disturbance  Cardiovascular: Negative for chest pain, palpitations, and leg swelling Respiratory: Negative cough, shortness of breath, and wheezing.  Gastrointestinal: Negative for nausea and vomiting. Negative for abdominal pain, diarrhea and constipation.  Genitourinary: Negative for  dysuria Musculoskeletal: Positive for generalized pain  Neurological: Negative for dizziness and headache.  Skin: Negative for rash and wound.   Psychiatric: Negative for depression  Past Medical History  Diagnosis Date  . Thyroid disease   . Hypertension   . GERD (gastroesophageal reflux  disease)   . Vitamin D deficiency   . Dementia   . Chronic pain   . Chronic kidney disease   . HCAP (healthcare-associated pneumonia) 05/17/2013  . GASTRIC ULCER 01/01/2008    Qualifier: Diagnosis of  By: Andrey CampanileWilson MD, Raliegh IpLauraLee    . DIVERTICULITIS, HX OF 01/01/2008    Qualifier: Diagnosis of  By: Andrey CampanileWilson MD, Raliegh IpLauraLee    . COLONIC POLYPS, HX OF 01/01/2008    Qualifier: Diagnosis of  By: Andrey CampanileWilson MD, LauraLee      No past surgical history on file.  History   Social History  . Marital Status: Divorced    Spouse Name: N/A    Number of Children: N/A  . Years of Education: N/A   Occupational History  . Not on file.   Social History Main Topics  . Smoking status: Unknown If Ever Smoked  . Smokeless tobacco: Not on file  . Alcohol Use: Not on file  . Drug Use: Not on file  . Sexual Activity: Not on file   Other Topics Concern  . Not on file   Social History Narrative    No family history on file.    Medication List       This list is accurate as of: 05/26/14  3:59 PM.  Always use your most recent med list.               BIOTENE MOISTURIZING MOUTH Soln  Use as directed 10 sprays in the mouth or throat 4 (four) times daily as needed.     bisacodyl 10 MG suppository  Commonly known as:  DULCOLAX  Place 10 mg rectally daily as needed.  levothyroxine 25 MCG tablet  Commonly known as:  SYNTHROID, LEVOTHROID  Take 25 mcg by mouth daily before breakfast.     morphine 20 MG/ML concentrated solution  Commonly known as:  ROXANOL  Give 0.6ml by mouth/under tongue every 8 hours as needed for pain/shortness of breath     sennosides-docusate sodium 8.6-50 MG tablet  Commonly known as:  SENOKOT-S  Take 1 tablet by mouth daily.     Vitamin D (Ergocalciferol) 50000 UNITS Caps capsule  Commonly known as:  DRISDOL  Take 50,000 Units by mouth every 7 (seven) days.        Physical Exam  BP 138/60 mmHg  Pulse 66  Temp(Src) 96.8 F (36 C)  Resp 18  Ht 5' (1.524 m)  Wt  136 lb 9.6 oz (61.961 kg)  BMI 26.68 kg/m2  Constitutional: WDWN elderly female in no acute distress. Appears younger than stated age.  Conversant and very pleasant.  HEENT: Normocephalic and atraumatic. PERRL. EOM intact. No icterus. No nasal discharge or sinus tenderness. Oral mucosa moist. Posterior pharynx clear of any exudate or lesions.  Neck: Supple and nontender. No lymphadenopathy, masses, or thyromegaly. No JVD or carotid bruits. Cardiac: Normal S1, S2. RRR without appreciable murmurs, rubs, or gallops. Distal pulses intact. Trace dependent edema.  Lungs: No respiratory distress. Breath sounds clear bilaterally without rales, rhonchi, or wheezes. Abdomen: Audible bowel sounds in all quadrants. Soft, nontender, nondistended.   Musculoskeletal: able to move all extremities. No joint erythema or tenderness.  Skin: Warm and dry. No rash noted. No erythema.  Neurological: Alert and oriented to self Psychiatric:  Appropriate mood and affect. Slightly lethargic.   Labs Reviewed  CBC Latest Ref Rng 08/22/2009 08/21/2009 08/20/2009  WBC 4.0 - 10.5 K/uL 11.6(H) 11.7(H) 18.1(H)  Hemoglobin 12.0 - 15.0 g/dL 96.0 45.4 09.8  Hematocrit 36.0 - 46.0 % 36.5 34.7(L) 36.9  Platelets 150 - 400 K/uL 173 160 170    CMP Latest Ref Rng 03/08/2014 09/17/2013 05/08/2013  Glucose 70 - 99 mg/dL - - -  BUN 4 - 21 mg/dL 11(B) 14(N) 82(N)  Creatinine 0.5 - 1.1 mg/dL 1.1 1.0 1.1  Sodium 562 - 147 mmol/L 139 136(A) 139  Potassium 3.4 - 5.3 mmol/L 4.3 3.8 4.3  Chloride 96 - 112 mEq/L - - -  CO2 19 - 32 mEq/L - - -  Calcium 8.4 - 10.5 mg/dL - - -  Total Protein 6.0 - 8.3 g/dL - - -  Total Bilirubin 0.3 - 1.2 mg/dL - - -  Alkaline Phos 39 - 117 U/L - - -  AST 0 - 37 U/L - - -  ALT 0 - 35 U/L - - -    Lab Results  Component Value Date   TSH 5.36 01/22/2014    Assessment & Plan 1. Constipation, unspecified constipation type Stable. Continue senna s daily and dulcolax  suppository daily as needed.  Encourage increasing fluid intake and OOB as tolerated. Continue to monitor  2. FTT Has gain about 13 lbs over the past month, which is desirable. Continue regular diet for fortified food with meals and ice cream twice daily with lunch dinner. Continue to monitor her status.   3. Dementia, without behavioral disturbance Stable. Off of meds. Continue to monitor for change in behaviors. Continue fall and blood pressure precautions.   4. Chronic pain syndrome Stable. Continue roxinol  every eight hours as needed for pain. Continue to monitor.  5. Hypothyroidism Restart levothyroxine  daily and monitor. Recheck TSH  in 6-8 weeks  6. Vit D deficiency Vit D level 32 on 03/08/14. Will start vitamin D3 50,000 iu weekly. Continue to monitor  7. CKD, stage 3 Stable. Avoid nephrotoxic agents. Continue to monitor renal function.   Labs: cbc (routine)  Family/Staff Communication Plan of care discussed with resident and nursing staff. Resident and nursing staff verbalized understanding and agree with plan of care. No additional questions or concerns reported.    Loura Back, MSN, AGNP-C [facility] [address] [place], [place] [address] [phone] [8am-5pm] After hours: [phone]

## 2014-05-30 DIAGNOSIS — Z79899 Other long term (current) drug therapy: Secondary | ICD-10-CM | POA: Diagnosis not present

## 2014-06-01 LAB — CBC AND DIFFERENTIAL
HCT: 44 % (ref 36–46)
HEMOGLOBIN: 14.5 g/dL (ref 12.0–16.0)
Platelets: 220 10*3/uL (ref 150–399)
WBC: 13.3 10*3/mL

## 2014-06-27 ENCOUNTER — Non-Acute Institutional Stay (SKILLED_NURSING_FACILITY): Payer: Medicare Other | Admitting: Registered Nurse

## 2014-06-27 DIAGNOSIS — E559 Vitamin D deficiency, unspecified: Secondary | ICD-10-CM

## 2014-06-27 DIAGNOSIS — F0390 Unspecified dementia without behavioral disturbance: Secondary | ICD-10-CM | POA: Diagnosis not present

## 2014-06-27 DIAGNOSIS — R627 Adult failure to thrive: Secondary | ICD-10-CM | POA: Diagnosis not present

## 2014-06-27 DIAGNOSIS — E039 Hypothyroidism, unspecified: Secondary | ICD-10-CM

## 2014-06-27 DIAGNOSIS — N183 Chronic kidney disease, stage 3 unspecified: Secondary | ICD-10-CM

## 2014-06-27 DIAGNOSIS — Z0000 Encounter for general adult medical examination without abnormal findings: Secondary | ICD-10-CM

## 2014-06-27 DIAGNOSIS — K5900 Constipation, unspecified: Secondary | ICD-10-CM

## 2014-06-28 ENCOUNTER — Encounter: Payer: Self-pay | Admitting: Registered Nurse

## 2014-06-28 NOTE — Progress Notes (Signed)
Patient ID: Shannon Gomez, female   DOB: Dec 28, 1913, 81 y.o.   MRN: [id number]   Place of Service: [facility]  Allergies  Allergen Reactions  . Sulfonamide Derivatives     Code Status: DNR  Goals of Care: Comfort and Quality of Life/LTC  Chief Complaint  Patient presents with  . Annual Exam  . Medical Management of Chronic Issues    dementia, FTT, hypothyroidism, vit D deficiency, constipation.     HPI 81 y.o. female with PMH of hypothyroidism, HTN, CAD, chronic pain, dementia among others is being seen for an annual exam and routine visit for management of her chronic issues. Has 5lbs weight loss over the past month. No recent falls or skin concerns reported. No change in behaviors or functional status reported. No concerns from staff. Constipation is stable with senna s and dulcolax prn. Currently on synthroid for hypothyroidism w/o any issues. Is up-to-date with her immunizations. Seen in room today. Denies any concerns.   Review of Systems Constitutional: Negative for fever and chills.  HENT: Negative for ear pain, congestion, and sore throat Eyes: Negative for eye pain Cardiovascular: Negative for chest pain and palpitations Respiratory: Negative cough and shortness of breath.  Gastrointestinal: Negative for nausea and vomiting. Negative for abdominal pain Genitourinary: Negative for dysuria Musculoskeletal:  Negative for back pain and joint pain Neurological: Negative for dizziness and headache.  Skin: Negative for rash and wound.   Psychiatric: Negative for depression  Past Medical History  Diagnosis Date  . Thyroid disease   . Hypertension   . GERD (gastroesophageal reflux disease)   . Vitamin D deficiency   . Dementia   . Chronic pain   . Chronic kidney disease   . HCAP (healthcare-associated pneumonia) 05/17/2013  . GASTRIC ULCER 01/01/2008    Qualifier: Diagnosis of  By: Andrey Campanile MD, Raliegh Ip    . DIVERTICULITIS, HX OF 01/01/2008    Qualifier:  Diagnosis of  By: Andrey Campanile MD, Raliegh Ip    . COLONIC POLYPS, HX OF 01/01/2008    Qualifier: Diagnosis of  By: Andrey Campanile MD, LauraLee      No past surgical history on file.  History   Social History  . Marital Status: Divorced    Spouse Name: N/A  . Number of Children: N/A  . Years of Education: N/A   Occupational History  . Not on file.   Social History Main Topics  . Smoking status: Unknown If Ever Smoked  . Smokeless tobacco: Not on file  . Alcohol Use: Not on file  . Drug Use: Not on file  . Sexual Activity: Not on file   Other Topics Concern  . Not on file   Social History Narrative      Medication List       This list is accurate as of: 06/27/14 11:59 PM.  Always use your most recent med list.               BIOTENE MOISTURIZING MOUTH Soln  Use as directed 10 sprays in the mouth or throat 4 (four) times daily as needed.     bisacodyl 10 MG suppository  Commonly known as:  DULCOLAX  Place 10 mg rectally daily as needed.     levothyroxine 25 MCG tablet  Commonly known as:  SYNTHROID, LEVOTHROID  Take 25 mcg by mouth daily before breakfast.     morphine 20 MG/ML concentrated solution  Commonly known as:  ROXANOL  Give 0.4ml by mouth/under tongue every 8 hours as  needed for pain/shortness of breath     sennosides-docusate sodium 8.6-50 MG tablet  Commonly known as:  SENOKOT-S  Take 1 tablet by mouth daily.     Vitamin D (Ergocalciferol) 50000 UNITS Caps capsule  Commonly known as:  DRISDOL  Take 50,000 Units by mouth every 7 (seven) days.        Physical Exam  BP 148/63 mmHg  Pulse 61  Temp(Src) 98.7 F (37.1 C)  Resp 16  Ht 5' (1.524 m)  Wt 131 lb 3.2 oz (59.512 kg)  BMI 25.62 kg/m2  SpO2 97%  Constitutional: WDWN elderly female in no acute distress. Appears younger than stated age.  Conversant. HEENT: Normocephalic and atraumatic. PERRL. EOM intact. No scleral icterus. Oral mucosa moist. Posterior pharynx clear of any exudate or lesions.    Neck: Supple and nontender. No lymphadenopathy, masses, or thyromegaly. No JVD or carotid bruits. Cardiac: Normal S1, S2. RRR without appreciable murmurs, rubs, or gallops. Distal pulses intact. Trace dependent edema.  Lungs: No respiratory distress. Breath sounds clear bilaterally without rales, rhonchi, or wheezes. Abdomen: Audible bowel sounds in all quadrants. Soft, nontender, nondistended.   Musculoskeletal: able to move all extremities. Skin: Warm and dry. No rash noted. No erythema.  Neurological: Alert and oriented to self Psychiatric:  Appropriate mood and affect.   Labs Reviewed  CBC Latest Ref Rng 06/01/2014 08/22/2009 08/21/2009  WBC - 13.3 11.6(H) 11.7(H)  Hemoglobin 12.0 - 16.0 g/dL 11.914.5 14.712.8 82.912.3  Hematocrit 36 - 46 % 44 36.5 34.7(L)  Platelets 150 - 399 K/L 220 173 160    CMP Latest Ref Rng 03/08/2014 09/17/2013 05/08/2013  Glucose 70 - 99 mg/dL - - -  BUN 4 - 21 mg/dL 56(O42(A) 13(Y28(A) 86(V42(A)  Creatinine 0.5 - 1.1 mg/dL 1.1 1.0 1.1  Sodium 784137 - 147 mmol/L 139 136(A) 139  Potassium 3.4 - 5.3 mmol/L 4.3 3.8 4.3  Chloride 96 - 112 mEq/L - - -  CO2 19 - 32 mEq/L - - -  Calcium 8.4 - 10.5 mg/dL - - -  Total Protein 6.0 - 8.3 g/dL - - -  Total Bilirubin 0.3 - 1.2 mg/dL - - -  Alkaline Phos 39 - 117 U/L - - -  AST 0 - 37 U/L - - -  ALT 0 - 35 U/L - - -    Lab Results  Component Value Date   TSH 5.36 01/22/2014    Assessment & Plan 1. Constipation, unspecified constipation type Stable. Continue senna s daily and dulcolax 10mg  suppository daily as needed. Encourage increasing fluid intake and OOB as tolerated. Continue to monitor  2. FTT Has 5lbs weight loss since last monthly visit. Further decline anticipated with dementia. Continue regular diet for fortified food with meals and ice cream twice daily with lunch dinner. Continue to monitor her status.   3. Dementia, without behavioral disturbance Stable at this point but decline is anticipated. Continue to monitor for  change in behaviors. Continue fall and pressure ulcer precautions.   4. Hypothyroidism No issues. Continue levothyroxine 25mg  daily and monitor  5. Vit D deficiency Stable. Continue Vitamin D3 50,000 iu weekly.  6. CKD, stage 3 Stable. Avoid nephrotoxic agents. Continue to monitor renal function.   7. Annual health exam Is up-to-date recommended vaccinations. Will order DEXA scan to screen for osteoporosis as she still has adequate functional capacity. Continue to monitor.   Health Promotion Continue to offer resident recommended vaccinations and screenings as appropriate for age and health status. Encourage physical  activities as tolerated and socialization with others.   Family/Staff Communication Plan of care discussed with resident and nursing staff. Resident and nursing staff verbalized understanding and agree with plan of care. No additional questions or concerns reported.    Loura Back, MSN, AGNP-C [facility] [address] [place], [place] [address] [phone] [8am-5pm] After hours: [phone]

## 2014-07-01 DIAGNOSIS — N958 Other specified menopausal and perimenopausal disorders: Secondary | ICD-10-CM | POA: Diagnosis not present

## 2014-07-05 ENCOUNTER — Non-Acute Institutional Stay (SKILLED_NURSING_FACILITY): Payer: Medicare Other | Admitting: Registered Nurse

## 2014-07-05 ENCOUNTER — Encounter: Payer: Self-pay | Admitting: Registered Nurse

## 2014-07-05 DIAGNOSIS — M810 Age-related osteoporosis without current pathological fracture: Secondary | ICD-10-CM | POA: Diagnosis not present

## 2014-07-05 NOTE — Progress Notes (Signed)
Patient ID: Shannon Gomez, female   DOB: 10/22/1913, 81 y.o.   MRN: [id number]   Place of Service: [facility]  Allergies  Allergen Reactions  . Sulfonamide Derivatives     Code Status: DNR  Goals of Care: Comfort and Quality of Life/LTC  Chief Complaint  Patient presents with  . Acute Visit    osteoporosis     HPI 81 y.o. female with PMH of hypothyroidism, HTN, CAD, chronic pain, dementia among others is being seen for a f/u on recent DEXA scan. Result came back positive for osteoporosis. Seen in room today. Denies any concerns  Review of Systems Constitutional: Negative for fever and chills.  HENT: Negative for ear pain, congestion, and sore throat Eyes: Negative for eye pain Cardiovascular: Negative for chest pain and palpitations Respiratory: Negative cough and shortness of breath.  Gastrointestinal: Negative for nausea and vomiting. Negative for abdominal pain Genitourinary: Negative for dysuria Musculoskeletal:  Negative for back pain and joint pain Neurological: Negative for dizziness and headache.  Skin: Negative for rash and wound.   Psychiatric: Negative for depression  Past Medical History  Diagnosis Date  . Thyroid disease   . Hypertension   . GERD (gastroesophageal reflux disease)   . Vitamin D deficiency   . Dementia   . Chronic pain   . Chronic kidney disease   . HCAP (healthcare-associated pneumonia) 05/17/2013  . GASTRIC ULCER 01/01/2008    Qualifier: Diagnosis of  By: Andrey CampanileWilson MD, Raliegh IpLauraLee    . DIVERTICULITIS, HX OF 01/01/2008    Qualifier: Diagnosis of  By: Andrey CampanileWilson MD, Raliegh IpLauraLee    . COLONIC POLYPS, HX OF 01/01/2008    Qualifier: Diagnosis of  By: Andrey CampanileWilson MD, LauraLee      No past surgical history on file.  History   Social History  . Marital Status: Divorced    Spouse Name: N/A  . Number of Children: N/A  . Years of Education: N/A   Occupational History  . Not on file.   Social History Main Topics  . Smoking status: Unknown  If Ever Smoked  . Smokeless tobacco: Not on file  . Alcohol Use: Not on file  . Drug Use: Not on file  . Sexual Activity: Not on file   Other Topics Concern  . Not on file   Social History Narrative      Medication List       This list is accurate as of: 07/05/14 10:06 PM.  Always use your most recent med list.               BIOTENE MOISTURIZING MOUTH Soln  Use as directed 10 sprays in the mouth or throat 4 (four) times daily as needed.     bisacodyl 10 MG suppository  Commonly known as:  DULCOLAX  Place 10 mg rectally daily as needed.     calcium carbonate 600 MG Tabs tablet  Commonly known as:  OS-CAL  Take 600 mg by mouth 2 (two) times daily with a meal.     levothyroxine 25 MCG tablet  Commonly known as:  SYNTHROID, LEVOTHROID  Take 25 mcg by mouth daily before breakfast.     morphine 20 MG/ML concentrated solution  Commonly known as:  ROXANOL  Give 0.2025ml by mouth/under tongue every 8 hours as needed for pain/shortness of breath     sennosides-docusate sodium 8.6-50 MG tablet  Commonly known as:  SENOKOT-S  Take 1 tablet by mouth daily.     Vitamin D (Ergocalciferol)  50000 UNITS Caps capsule  Commonly known as:  DRISDOL  Take 50,000 Units by mouth every 7 (seven) days.        Physical Exam  BP 137/75 mmHg  Pulse 74  Temp(Src) 98.1 F (36.7 C)  Resp 18  Constitutional: WDWN elderly female in no acute distress. Appears younger than stated age.  Conversant. HEENT: Normocephalic and atraumatic. PERRL. EOM intact. No scleral icterus. Oral mucosa moist. Posterior pharynx clear of any exudate or lesions.  Neck: Supple and nontender. No lymphadenopathy, masses, or thyromegaly. No JVD or carotid bruits. Cardiac: Normal S1, S2. RRR without appreciable murmurs, rubs, or gallops. Distal pulses intact. Trace dependent edema.  Lungs: No respiratory distress. Breath sounds clear bilaterally without rales, rhonchi, or wheezes. Abdomen: Audible bowel sounds in all  quadrants. Soft, nontender, nondistended.   Musculoskeletal: able to move all extremities. Skin: Warm and dry. No rash noted. No erythema.  Neurological: Alert and oriented to self Psychiatric:  Appropriate mood and affect.   Labs Reviewed  CBC Latest Ref Rng 06/01/2014 08/22/2009 08/21/2009  WBC - 13.3 11.6(H) 11.7(H)  Hemoglobin 12.0 - 16.0 g/dL 16.1 09.6 04.5  Hematocrit 36 - 46 % 44 36.5 34.7(L)  Platelets 150 - 399 K/L 220 173 160    CMP Latest Ref Rng 03/08/2014 09/17/2013 05/08/2013  Glucose 70 - 99 mg/dL - - -  BUN 4 - 21 mg/dL 40(J) 81(X) 91(Y)  Creatinine 0.5 - 1.1 mg/dL 1.1 1.0 1.1  Sodium 782 - 147 mmol/L 139 136(A) 139  Potassium 3.4 - 5.3 mmol/L 4.3 3.8 4.3  Chloride 96 - 112 mEq/L - - -  CO2 19 - 32 mEq/L - - -  Calcium 8.4 - 10.5 mg/dL - - -  Total Protein 6.0 - 8.3 g/dL - - -  Total Bilirubin 0.3 - 1.2 mg/dL - - -  Alkaline Phos 39 - 117 U/L - - -  AST 0 - 37 U/L - - -  ALT 0 - 35 U/L - - -    Lab Results  Component Value Date   TSH 5.36 01/22/2014    Assessment & Plan 1. Osteoporosis Family notified [name]) of DEXA result. Risks vs. Benefits of bisphosphonates discussed with family. Family agrees to calcium supplement instead bisphosphonates for osteoporosis due to advanced age and potential medication side effects. Start calcium carbonate  twice daily and continue vit D 50,000 units weekly. Continue to monitor her status. Fall risk precautions.  Family/Staff Communication Plan of care discussed with family and nursing staff. Family and nursing staff verbalized understanding and agree with plan of care. No additional questions or concerns reported.    Loura Back, MSN, AGNP-C [facility] [address] [place], [place] [address] [phone] [8am-5pm] After hours: [phone]

## 2014-08-01 ENCOUNTER — Non-Acute Institutional Stay (SKILLED_NURSING_FACILITY): Payer: Medicare Other | Admitting: Internal Medicine

## 2014-08-01 DIAGNOSIS — M810 Age-related osteoporosis without current pathological fracture: Secondary | ICD-10-CM | POA: Diagnosis not present

## 2014-08-01 DIAGNOSIS — E039 Hypothyroidism, unspecified: Secondary | ICD-10-CM

## 2014-08-01 DIAGNOSIS — G8929 Other chronic pain: Secondary | ICD-10-CM | POA: Diagnosis not present

## 2014-08-01 DIAGNOSIS — R682 Dry mouth, unspecified: Secondary | ICD-10-CM | POA: Insufficient documentation

## 2014-08-01 DIAGNOSIS — F0390 Unspecified dementia without behavioral disturbance: Secondary | ICD-10-CM | POA: Diagnosis not present

## 2014-08-01 DIAGNOSIS — K5909 Other constipation: Secondary | ICD-10-CM | POA: Diagnosis not present

## 2014-08-01 NOTE — Progress Notes (Signed)
Patient ID: Shannon FieldElizabeth N Gomez, female   DOB: 05/03/1914, 81 y.o.   MRN: [id number]    Facility: [facility]   Chief Complaint  Patient presents with  . Medical Management of Chronic Issues   Allergies  Allergen Reactions  . Sulfonamide Derivatives    HPI 81 y/o female pt is seen for routine visit. She is a long term care resident here. She was under hospice services last year. She is now off hospice. She has PMH of CAD, dementia, HTN, hypothyroidism, constipation and chronic pain among others. She recently had a dexa scan with T score of -4.4. She was started on oscal and is on weekly dosing of vitamin d.  No recent falls or fractures reported. No skin concerns from staff.  She is seen in her room today. She is pleasantly confused and tells me that she would like to go see her mother. Easily distractable, not answering asked questions. Unable to obtain ROS today. She is in no distress.  ROS Unable to obtain  Past Medical History  Diagnosis Date  . Thyroid disease   . Hypertension   . GERD (gastroesophageal reflux disease)   . Vitamin D deficiency   . Dementia   . Chronic pain   . Chronic kidney disease   . HCAP (healthcare-associated pneumonia) 05/17/2013  . GASTRIC ULCER 01/01/2008    Qualifier: Diagnosis of  By: Andrey CampanileWilson MD, Raliegh IpLauraLee    . DIVERTICULITIS, HX OF 01/01/2008    Qualifier: Diagnosis of  By: Andrey CampanileWilson MD, Raliegh IpLauraLee    . COLONIC POLYPS, HX OF 01/01/2008    Qualifier: Diagnosis of  By: Andrey CampanileWilson MD, LauraLee       Medication List       This list is accurate as of: 08/01/14  5:08 PM.  Always use your most recent med list.               BIOTENE MOISTURIZING MOUTH Soln  Use as directed 10 sprays in the mouth or throat 4 (four) times daily as needed.     bisacodyl 10 MG suppository  Commonly known as:  DULCOLAX  Place 10 mg rectally daily as needed.     calcium carbonate 600 MG Tabs tablet  Commonly known as:  OS-CAL  Take 600 mg by mouth 2  (two) times daily with a meal.     levothyroxine 25 MCG tablet  Commonly known as:  SYNTHROID, LEVOTHROID  Take 25 mcg by mouth daily before breakfast.     morphine 20 MG/ML concentrated solution  Commonly known as:  ROXANOL  Give 0.7125ml by mouth/under tongue every 8 hours as needed for pain/shortness of breath     sennosides-docusate sodium 8.6-50 MG tablet  Commonly known as:  SENOKOT-S  Take 1 tablet by mouth daily.     Vitamin D (Ergocalciferol) 50000 UNITS Caps capsule  Commonly known as:  DRISDOL  Take 50,000 Units by mouth every 7 (seven) days.       Physical exam BP 134/67 mmHg  Pulse 61  Temp(Src) 98 F (36.7 C)  Resp 18  SpO2 95%  General- elderly female in no acute distress Head- atraumatic, normocephalic Eyes- no pallor, no icterus, no discharge Neck- no lymphadenopathy Mouth- normal mucus membrane Cardiovascular- normal s1,s2, no murmurs, normal distal pulses, trace leg edema Respiratory- bilateral clear to auscultation, no wheeze, no rhonchi, no crackles Abdomen- bowel sounds present, soft, non tender Musculoskeletal- able to move all 4 extremities, no spinal and paraspinal tenderness, steady gait, no  use of assistive device, normal range of motion Neurological- no focal deficit, pleasantly confused Skin- warm and dry Psychiatry- alert and oriented to person only  Labs 07/01/14 dexa scan t socre -4.4 05/30/14 wbc 13.3, hb 14.5, plt 220 CMP Latest Ref Rng 03/08/2014 09/17/2013 05/08/2013  Glucose 70 - 99 mg/dL - - -  BUN 4 - 21 mg/dL 16(X) 09(U) 04(V)  Creatinine 0.5 - 1.1 mg/dL 1.1 1.0 1.1  Sodium 409 - 147 mmol/L 139 136(A) 139  Potassium 3.4 - 5.3 mmol/L 4.3 3.8 4.3  Chloride 96 - 112 mEq/L - - -  CO2 19 - 32 mEq/L - - -  Calcium 8.4 - 10.5 mg/dL - - -  Total Protein 6.0 - 8.3 g/dL - - -  Total Bilirubin 0.3 - 1.2 mg/dL - - -  Alkaline Phos 39 - 117 U/L - - -  AST 0 - 37 U/L - - -  ALT 0 - 35 U/L - - -   Lab Results  Component Value Date   TSH  5.36 01/22/2014    Assessment/plan  Dementia without behavioral disturbance Off all medications at present. No agitation. Monitor clinically. Consider adding anxiolytics if needed. Fall precautions. Pressure ulcer prophylaxis. Continue fortified food with meals, monitor her weight. Assistance with her ADLs to be provided  Senile osteoporosis With her age, no falls reported, continue oscal and vitamin d, fall precautions  Dry mouth Continue biotene  Constipation Stable, continue dulcolax suppository and senokot s current regimen  Hypothyroidism Continue levothyroxine, reviewed tsh from 9/15 slightly on higher side of normal, recheck tsh next lab and adjust levothyroxine if needed  Chronic pain On roxanol 5 mg q8h prn pain. With her being able to tolerate po intake, discontinue roxanol and will have her on tramadol 50 mg 1-2 tab every 8h prn pain for now. reassess  Code status: DNR  Labs- cbc with diff, cmp, tsh next lab

## 2014-08-04 DIAGNOSIS — E039 Hypothyroidism, unspecified: Secondary | ICD-10-CM | POA: Diagnosis not present

## 2014-08-04 LAB — CBC AND DIFFERENTIAL
HCT: 40 % (ref 36–46)
Hemoglobin: 13.6 g/dL (ref 12.0–16.0)
Platelets: 234 10*3/uL (ref 150–399)
WBC: 10.9 10^3/mL

## 2014-08-04 LAB — TSH: TSH: 3.98 u[IU]/mL (ref 0.41–5.90)

## 2014-08-04 LAB — BASIC METABOLIC PANEL
BUN: 33 mg/dL — AB (ref 4–21)
Creatinine: 1 mg/dL (ref 0.5–1.1)
Glucose: 88 mg/dL
Potassium: 4.4 mmol/L (ref 3.4–5.3)
Sodium: 135 mmol/L — AB (ref 137–147)

## 2014-08-22 DIAGNOSIS — N390 Urinary tract infection, site not specified: Secondary | ICD-10-CM | POA: Diagnosis not present

## 2014-08-25 ENCOUNTER — Encounter: Payer: Self-pay | Admitting: Registered Nurse

## 2014-08-25 ENCOUNTER — Non-Acute Institutional Stay (SKILLED_NURSING_FACILITY): Payer: Medicare Other | Admitting: Registered Nurse

## 2014-08-25 DIAGNOSIS — B9620 Unspecified Escherichia coli [E. coli] as the cause of diseases classified elsewhere: Secondary | ICD-10-CM

## 2014-08-25 DIAGNOSIS — N390 Urinary tract infection, site not specified: Secondary | ICD-10-CM

## 2014-08-25 NOTE — Progress Notes (Signed)
Patient ID: Shannon Gomez, female   DOB: 04/12/1914, 81 y.o.   MRN: [id number]   Place of Service: [facility]  Allergies  Allergen Reactions  . Sulfonamide Derivatives     Code Status: DNR  Goals of Care: Comfort and Quality of Life/LTC  Chief Complaint  Patient presents with  . Acute Visit    UTI    HPI 81 y.o. female with PMH of hypothyroidism, HTN, CAD, chronic pain, dementia among others is being seen for UTI. UA with c&s was obtained on 08/22/14 due to increased confusion and agitation. Urine culture positive for E. Coli UTI. Seen in room today. Denies any concerns.   Review of Systems Constitutional: Negative for fever and chills.  Cardiovascular: Negative for chest pain and palpitations Respiratory: Negative cough and shortness of breath.  Gastrointestinal: Negative for nausea and vomiting. Negative for abdominal pain Genitourinary: Negative for dysuria   Past Medical History  Diagnosis Date  . Thyroid disease   . Hypertension   . GERD (gastroesophageal reflux disease)   . Vitamin D deficiency   . Dementia   . Chronic pain   . Chronic kidney disease   . HCAP (healthcare-associated pneumonia) 05/17/2013  . GASTRIC ULCER 01/01/2008    Qualifier: Diagnosis of  By: Andrey CampanileWilson MD, Raliegh IpLauraLee    . DIVERTICULITIS, HX OF 01/01/2008    Qualifier: Diagnosis of  By: Andrey CampanileWilson MD, Raliegh IpLauraLee    . COLONIC POLYPS, HX OF 01/01/2008    Qualifier: Diagnosis of  By: Andrey CampanileWilson MD, LauraLee      No past surgical history on file.  History   Social History  . Marital Status: Divorced    Spouse Name: N/A  . Number of Children: N/A  . Years of Education: N/A   Occupational History  . Not on file.   Social History Main Topics  . Smoking status: Unknown If Ever Smoked  . Smokeless tobacco: Not on file  . Alcohol Use: Not on file  . Drug Use: Not on file  . Sexual Activity: Not on file   Other Topics Concern  . Not on file   Social History Narrative      Medication  List       This list is accurate as of: 08/25/14 10:42 AM.  Always use your most recent med list.               BIOTENE MOISTURIZING MOUTH Soln  Use as directed 10 sprays in the mouth or throat 4 (four) times daily as needed.     bisacodyl 10 MG suppository  Commonly known as:  DULCOLAX  Place 10 mg rectally daily as needed.     calcium carbonate 600 MG Tabs tablet  Commonly known as:  OS-CAL  Take 600 mg by mouth 2 (two) times daily with a meal.     levothyroxine 25 MCG tablet  Commonly known as:  SYNTHROID, LEVOTHROID  Take 25 mcg by mouth daily before breakfast.     morphine 20 MG/ML concentrated solution  Commonly known as:  ROXANOL  Give 0.7625ml by mouth/under tongue every 8 hours as needed for pain/shortness of breath     sennosides-docusate sodium 8.6-50 MG tablet  Commonly known as:  SENOKOT-S  Take 1 tablet by mouth daily.     Vitamin D (Ergocalciferol) 50000 UNITS Caps capsule  Commonly known as:  DRISDOL  Take 50,000 Units by mouth every 7 (seven) days.        Physical Exam  BP 121/67  mmHg  Pulse 74  Temp(Src) 98.6 F (37 C)  Resp 17  Constitutional: WDWN elderly female in no acute distress. Appears younger than stated age.  HEENT: Normocephalic and atraumatic. PERRL. Oral mucosa moist. Posterior pharynx clear of any exudate or lesions.  Neck: Supple and nontender. No lymphadenopathy, masses, or thyromegaly. No JVD or carotid bruits. Cardiac: Normal S1, S2. RRR without appreciable murmurs, rubs, or gallops. Distal pulses intact. Trace dependent edema.  Lungs: No respiratory distress. Breath sounds clear bilaterally without rales, rhonchi, or wheezes. Abdomen: Audible bowel sounds in all quadrants. Soft, nontender, nondistended.    Neurological: Arousable with confusions Psychiatric:  Appropriate mood and affect.   Labs Reviewed  CBC Latest Ref Rng 06/01/2014 08/22/2009 08/21/2009  WBC - 13.3 11.6(H) 11.7(H)  Hemoglobin 12.0 - 16.0 g/dL 01.0 27.2 53.6    Hematocrit 36 - 46 % 44 36.5 34.7(L)  Platelets 150 - 399 K/L 220 173 160    CMP Latest Ref Rng 03/08/2014 09/17/2013 05/08/2013  Glucose 70 - 99 mg/dL - - -  BUN 4 - 21 mg/dL 64(Q) 03(K) 74(Q)  Creatinine 0.5 - 1.1 mg/dL 1.1 1.0 1.1  Sodium 595 - 147 mmol/L 139 136(A) 139  Potassium 3.4 - 5.3 mmol/L 4.3 3.8 4.3  Chloride 96 - 112 mEq/L - - -  CO2 19 - 32 mEq/L - - -  Calcium 8.4 - 10.5 mg/dL - - -  Total Protein 6.0 - 8.3 g/dL - - -  Total Bilirubin 0.3 - 1.2 mg/dL - - -  Alkaline Phos 39 - 117 U/L - - -  AST 0 - 37 U/L - - -  ALT 0 - 35 U/L - - -    Assessment & Plan 1. E. Coli UTI Rocephin 1g IM daily x 7 days with florastor  twice daily x 7 days. Encourage hydration. Continue skin care.   Family/Staff Communication Plan of care discussed with nursing staff. Nursing staff verbalized understanding and agree with plan of care. No additional questions or concerns reported.    Loura Back, MSN, AGNP-C [facility] [address] [place], [place] [address] [phone] [8am-5pm] After hours: [phone]

## 2014-08-29 ENCOUNTER — Encounter: Payer: Self-pay | Admitting: Registered Nurse

## 2014-08-29 ENCOUNTER — Non-Acute Institutional Stay (SKILLED_NURSING_FACILITY): Payer: Medicare Other | Admitting: Registered Nurse

## 2014-08-29 DIAGNOSIS — M810 Age-related osteoporosis without current pathological fracture: Secondary | ICD-10-CM

## 2014-08-29 DIAGNOSIS — E039 Hypothyroidism, unspecified: Secondary | ICD-10-CM | POA: Diagnosis not present

## 2014-08-29 DIAGNOSIS — N390 Urinary tract infection, site not specified: Secondary | ICD-10-CM | POA: Diagnosis not present

## 2014-08-29 DIAGNOSIS — N183 Chronic kidney disease, stage 3 unspecified: Secondary | ICD-10-CM

## 2014-08-29 DIAGNOSIS — E559 Vitamin D deficiency, unspecified: Secondary | ICD-10-CM

## 2014-08-29 DIAGNOSIS — K5900 Constipation, unspecified: Secondary | ICD-10-CM | POA: Diagnosis not present

## 2014-08-29 DIAGNOSIS — F0390 Unspecified dementia without behavioral disturbance: Secondary | ICD-10-CM | POA: Diagnosis not present

## 2014-08-29 DIAGNOSIS — G894 Chronic pain syndrome: Secondary | ICD-10-CM

## 2014-08-29 DIAGNOSIS — B9620 Unspecified Escherichia coli [E. coli] as the cause of diseases classified elsewhere: Secondary | ICD-10-CM | POA: Diagnosis not present

## 2014-08-29 DIAGNOSIS — R627 Adult failure to thrive: Secondary | ICD-10-CM

## 2014-08-29 NOTE — Progress Notes (Signed)
Patient ID: Shannon Gomez, female   DOB: July 26, 1913, 81 y.o.   MRN: [id number]   Place of Service: [facility]  Allergies  Allergen Reactions  . Sulfonamide Derivatives     Code Status: DNR  Goals of Care: Comfort and Quality of Life/LTC  Chief Complaint  Patient presents with  . Medical Management of Chronic Issues    Dementia, hypothyroidism osteoporosis, chronic pain    HPI 81 y.o. female with PMH of dementia, hypothyroidism, HTN, CAD, chronic pain, dementia among others is being seen for a routine visit for management of her chronic issues. Weight stable over the past 30 days. No recent falls or skin concerns reported. No change in behaviors or functional status reported. No concerns from staff. Currently on Macrobid for E. Coli UTI due to refusal of Rocephin IM injection. Hypothyroidism stable on levothyroxine. Constipation is stable with senna s and dulcolax prn. Vit D stable on vit D supplement. Pain is adequately controlled with current regimen. Seen in room today. Denies any concerns.   Review of Systems Constitutional: Negative for fever and chills.  HENT: Negative for ear pain, congestion, and sore throat Eyes: Negative for eye pain Cardiovascular: Negative for chest pain and palpitations Respiratory: Negative cough and shortness of breath.  Gastrointestinal: Negative for nausea and vomiting. Negative for abdominal pain Genitourinary: Negative for dysuria Musculoskeletal:  Negative for back pain and joint pain Neurological: Negative for dizziness and headache.  Skin: Negative for rash and wound.   Psychiatric: Negative for depression  Past Medical History  Diagnosis Date  . Thyroid disease   . Hypertension   . GERD (gastroesophageal reflux disease)   . Vitamin D deficiency   . Dementia   . Chronic pain   . Chronic kidney disease   . HCAP (healthcare-associated pneumonia) 05/17/2013  . GASTRIC ULCER 01/01/2008    Qualifier: Diagnosis of  By: Andrey Campanile  MD, Raliegh Ip    . DIVERTICULITIS, HX OF 01/01/2008    Qualifier: Diagnosis of  By: Andrey Campanile MD, Raliegh Ip    . COLONIC POLYPS, HX OF 01/01/2008    Qualifier: Diagnosis of  By: Andrey Campanile MD, LauraLee      No past surgical history on file.  History   Social History  . Marital Status: Divorced    Spouse Name: N/A  . Number of Children: N/A  . Years of Education: N/A   Occupational History  . Not on file.   Social History Main Topics  . Smoking status: Unknown If Ever Smoked  . Smokeless tobacco: Not on file  . Alcohol Use: Not on file  . Drug Use: Not on file  . Sexual Activity: Not on file   Other Topics Concern  . Not on file   Social History Narrative      Medication List       This list is accurate as of: 08/29/14  7:30 PM.  Always use your most recent med list.               BIOTENE MOISTURIZING MOUTH Soln  Use as directed 10 sprays in the mouth or throat 4 (four) times daily as needed.     bisacodyl 10 MG suppository  Commonly known as:  DULCOLAX  Place 10 mg rectally daily as needed.     calcium carbonate 600 MG Tabs tablet  Commonly known as:  OS-CAL  Take 600 mg by mouth 2 (two) times daily with a meal.     levothyroxine 25 MCG tablet  Commonly  known as:  SYNTHROID, LEVOTHROID  Take 25 mcg by mouth daily before breakfast.     sennosides-docusate sodium 8.6-50 MG tablet  Commonly known as:  SENOKOT-S  Take 1 tablet by mouth daily.     traMADol 50 MG tablet  Commonly known as:  ULTRAM  Take 50-100 mg by mouth every 8 (eight) hours as needed.     Vitamin D (Ergocalciferol) 50000 UNITS Caps capsule  Commonly known as:  DRISDOL  Take 50,000 Units by mouth every 7 (seven) days.        Physical Exam  BP 144/82 mmHg  Pulse 60  Temp(Src) 97.7 F (36.5 C)  Resp 17  Ht 5' (1.524 m)  Wt 134 lb 9.6 oz (61.054 kg)  BMI 26.29 kg/m2  SpO2 96%  Constitutional: WDWN elderly female in no acute distress. Appears younger than stated age.  Conversant with  confusions. HEENT: Normocephalic and atraumatic. PERRL. EOM intact. No scleral icterus. Oral mucosa moist. Posterior pharynx clear of any exudate or lesions.  Neck: Supple and nontender. No lymphadenopathy, masses, or thyromegaly. No JVD or carotid bruits. Cardiac: Normal S1, S2. RRR without appreciable murmurs, rubs, or gallops. Distal pulses intact. Trace dependent edema.  Respiratory: No respiratory distress. Breath sounds clear bilaterally without rales, rhonchi, or wheezes. GI: Audible bowel sounds in all quadrants. Soft, nontender, nondistended.   Musculoskeletal: able to move all extremities. Skin: Warm and dry. No rash noted. No erythema.  Neurological: Alert and oriented to self Psychiatric: flat affect.   Labs Reviewed  CBC Latest Ref Rng 08/04/2014 06/01/2014 08/22/2009  WBC - 10.9 13.3 11.6(H)  Hemoglobin 12.0 - 16.0 g/dL 16.1 09.6 04.5  Hematocrit 36 - 46 % 40 44 36.5  Platelets 150 - 399 K/L 234 220 173    CMP Latest Ref Rng 08/04/2014 03/08/2014 09/17/2013  Glucose 70 - 99 mg/dL - - -  BUN 4 - 21 mg/dL 40(J) 81(X) 91(Y)  Creatinine 0.5 - 1.1 mg/dL 1.0 1.1 1.0  Sodium 782 - 147 mmol/L 135(A) 139 136(A)  Potassium 3.4 - 5.3 mmol/L 4.4 4.3 3.8  Chloride 96 - 112 mEq/L - - -  CO2 19 - 32 mEq/L - - -  Calcium 8.4 - 10.5 mg/dL - - -  Total Protein 6.0 - 8.3 g/dL - - -  Total Bilirubin 0.3 - 1.2 mg/dL - - -  Alkaline Phos 39 - 117 U/L - - -  AST 0 - 37 U/L - - -  ALT 0 - 35 U/L - - -    Lab Results  Component Value Date   TSH 3.98 08/04/2014    Assessment & Plan 1. Constipation, unspecified constipation type No issues. Continue senna s daily and dulcolax  suppository daily as needed. Encourage hydration and OOB as tolerated.  2. FTT Weight stable over the past 30 days. Further decline anticipated with dementia. Continue current diet and dietary supplement. Continue to monitor her status.   3. Dementia, without behavioral disturbance Persists. Further declined  anticipated. Continue to monitor for change in behaviors. Continue assist with ADLs and fall and pressure ulcer precautions.   4. Hypothyroidism TSH normal. Continue levothyroxine  daily   5. Vit D deficiency Stable. Continue Vitamin D 50,000 units weekly.   6. CKD, stage 3 Stable. Avoid nephrotoxic agents. Continue to monitor renal function.   7. E. Coli UTI Continue and complete macrobid and florastor. Encourage hydration. Continue pericare.   8. Chronic pain syndrome Stable. Continue tramadol  1-2 tabs every 8 hours  as needed for pain.   9. Senile osteoporosis  T-score -4.4. Continue calcium and vit D supplement per family request. Fall risk precautions   Family/Staff Communication Plan of care discussed with resident and nursing staff. Resident and nursing staff verbalized understanding and agree with plan of care. No additional questions or concerns reported.    Loura BackKim Jaire Pinkham, MSN, AGNP-C [facility] [address] [place], [place] [address] [phone] [8am-5pm] After hours: [phone]

## 2014-09-26 ENCOUNTER — Non-Acute Institutional Stay (SKILLED_NURSING_FACILITY): Payer: Medicare Other | Admitting: Registered Nurse

## 2014-09-26 ENCOUNTER — Encounter: Payer: Self-pay | Admitting: Registered Nurse

## 2014-09-26 DIAGNOSIS — M810 Age-related osteoporosis without current pathological fracture: Secondary | ICD-10-CM

## 2014-09-26 DIAGNOSIS — R627 Adult failure to thrive: Secondary | ICD-10-CM | POA: Diagnosis not present

## 2014-09-26 DIAGNOSIS — I10 Essential (primary) hypertension: Secondary | ICD-10-CM

## 2014-09-26 DIAGNOSIS — K5900 Constipation, unspecified: Secondary | ICD-10-CM | POA: Diagnosis not present

## 2014-09-26 DIAGNOSIS — E039 Hypothyroidism, unspecified: Secondary | ICD-10-CM | POA: Diagnosis not present

## 2014-09-26 DIAGNOSIS — N183 Chronic kidney disease, stage 3 unspecified: Secondary | ICD-10-CM

## 2014-09-26 DIAGNOSIS — E559 Vitamin D deficiency, unspecified: Secondary | ICD-10-CM

## 2014-09-26 DIAGNOSIS — F0390 Unspecified dementia without behavioral disturbance: Secondary | ICD-10-CM

## 2014-09-26 NOTE — Progress Notes (Signed)
Patient ID: Shannon Gomez, female   DOB: 11-May-1913, 81 y.o.   MRN: [id number]   Place of Service: [facility]  Allergies  Allergen Reactions  . Sulfonamide Derivatives     Code Status: DNR  Goals of Care: Comfort and Quality of Life/LTC  Chief Complaint  Patient presents with  . Medical Management of Chronic Issues    dementia, hypothyroidism, constipation, osteoporosis     HPI 81 y.o. female with PMH of dementia, hypothyroidism, HTN, CAD, chronic pain, dementia among others is being seen for a routine visit for management of her chronic issues. Weight stable over the past 30 days. No recent falls or skin concerns reported. No change in behaviors or functional status reported. No concerns from staff. BP has been elevated in 160s-170/60-80s-currently not on any antihypertensives. Hypothyroidism stable on levothyroxine. Constipation is stable with senna s and dulcolax prn. Vit D stable on vit D supplement. Seen in room today. Denies pain. Denies any concerns.   Review of Systems Constitutional: Negative for fever and chills.  HENT: Negative for ear pain, congestion, and sore throat Eyes: Negative for eye pain Cardiovascular: Negative for chest pain and palpitations Respiratory: Negative cough and shortness of breath.  Gastrointestinal: Negative for nausea and vomiting. Negative for abdominal pain Genitourinary: Negative for dysuria Musculoskeletal:  Negative for uncontrolled pain Neurological: Negative for dizziness and headache.  Skin: Negative for rash and wound.   Psychiatric: Negative for depression  Past Medical History  Diagnosis Date  . Thyroid disease   . Hypertension   . GERD (gastroesophageal reflux disease)   . Vitamin D deficiency   . Dementia   . Chronic pain   . Chronic kidney disease   . HCAP (healthcare-associated pneumonia) 05/17/2013  . GASTRIC ULCER 01/01/2008    Qualifier: Diagnosis of  By: Andrey Campanile MD, Raliegh Ip    . DIVERTICULITIS, HX OF  01/01/2008    Qualifier: Diagnosis of  By: Andrey Campanile MD, Raliegh Ip    . COLONIC POLYPS, HX OF 01/01/2008    Qualifier: Diagnosis of  By: Andrey Campanile MD, LauraLee      No past surgical history on file.  History   Social History  . Marital Status: Divorced    Spouse Name: N/A  . Number of Children: N/A  . Years of Education: N/A   Occupational History  . Not on file.   Social History Main Topics  . Smoking status: Unknown If Ever Smoked  . Smokeless tobacco: Not on file  . Alcohol Use: Not on file  . Drug Use: Not on file  . Sexual Activity: Not on file   Other Topics Concern  . Not on file   Social History Narrative      Medication List       This list is accurate as of: 09/26/14  1:02 PM.  Always use your most recent med list.               amLODipine 5 MG tablet  Commonly known as:  NORVASC  Take 5 mg by mouth daily.     BIOTENE MOISTURIZING MOUTH Soln  Use as directed 10 sprays in the mouth or throat 4 (four) times daily as needed.     bisacodyl 10 MG suppository  Commonly known as:  DULCOLAX  Place 10 mg rectally daily as needed.     calcium carbonate 600 MG Tabs tablet  Commonly known as:  OS-CAL  Take 600 mg by mouth 2 (two) times daily with a meal.  levothyroxine 25 MCG tablet  Commonly known as:  SYNTHROID, LEVOTHROID  Take 25 mcg by mouth daily before breakfast.     sennosides-docusate sodium 8.6-50 MG tablet  Commonly known as:  SENOKOT-S  Take 1 tablet by mouth daily.     traMADol 50 MG tablet  Commonly known as:  ULTRAM  Take 50-100 mg by mouth every 8 (eight) hours as needed.     Vitamin D (Ergocalciferol) 50000 UNITS Caps capsule  Commonly known as:  DRISDOL  Take 50,000 Units by mouth every 7 (seven) days.        Physical Exam  BP 163/76 mmHg  Pulse 62  Temp(Src) 97.6 F (36.4 C)  Resp 16  Ht 5' (1.524 m)  Wt 134 lb 3.2 oz (60.873 kg)  BMI 26.21 kg/m2  SpO2 96%  Constitutional: frail elderly female in no acute distress.  Appears younger than stated age.  Conversant with confusions. HEENT: Normocephalic and atraumatic. PERRL. EOM intact. No scleral icterus. Oral mucosa moist. Posterior pharynx clear of any exudate or lesions.  Neck: Supple and nontender. No lymphadenopathy, masses, or thyromegaly. No JVD or carotid bruits. Cardiac: Normal S1, S2. RRR without appreciable murmurs, rubs, or gallops. Distal pulses intact. Trace dependent edema.  Respiratory: No respiratory distress. Breath sounds clear bilaterally without rales, rhonchi, or wheezes. GI: Audible bowel sounds in all quadrants. Soft, nontender, nondistended.   Musculoskeletal: able to move all extremities. Skin: Warm and dry. No rash noted. No erythema.  Neurological: Alert and oriented to self Psychiatric: flat affect.   Labs Reviewed  CBC Latest Ref Rng 08/04/2014 06/01/2014 08/22/2009  WBC - 10.9 13.3 11.6(H)  Hemoglobin 12.0 - 16.0 g/dL 14.713.6 82.914.5 56.212.8  Hematocrit 36 - 46 % 40 44 36.5  Platelets 150 - 399 K/L 234 220 173    CMP Latest Ref Rng 08/04/2014 03/08/2014 09/17/2013  Glucose 70 - 99 mg/dL - - -  BUN 4 - 21 mg/dL 13(Y33(A) 86(V42(A) 78(I28(A)  Creatinine 0.5 - 1.1 mg/dL 1.0 1.1 1.0  Sodium 696137 - 147 mmol/L 135(A) 139 136(A)  Potassium 3.4 - 5.3 mmol/L 4.4 4.3 3.8  Chloride 96 - 112 mEq/L - - -  CO2 19 - 32 mEq/L - - -  Calcium 8.4 - 10.5 mg/dL - - -  Total Protein 6.0 - 8.3 g/dL - - -  Total Bilirubin 0.3 - 1.2 mg/dL - - -  Alkaline Phos 39 - 117 U/L - - -  AST 0 - 37 U/L - - -  ALT 0 - 35 U/L - - -    Lab Results  Component Value Date   TSH 3.98 08/04/2014    Assessment & Plan 1. Essential hypertension, benign Start Norvasc 5mg  daily. Monitor BP Qshift x 1 week then daily. Nursing staff to notify NP/MD if BP consistently >150/90. Reassess and continue to monitor her status.   2. Dementia without behavioral disturbance Progessing. Further decline anticipated. Continue to monitor for change in behaviors. Continue assist with ADLs and  fall and pressure ulcer precautions.   3. Senile osteoporosis T-score -4.4. Continue calcium and vit D supplement per family request. Continue fall precautions  4. Hypothyroidism, unspecified hypothyroidism type TSH normal. Continue levothyroxine 25mg  daily   5. Constipation, unspecified constipation type Stable. Continue senna s daily and dulcolax 10mg  suppository daily as needed. Encourage hydration.  6. FTT (failure to thrive) in adult Weight stable over the past 30 days. Further decline anticipated with dementia, decreased functional mobility. Continue current diet and dietary supplement.  Continue to monitor her status.  7. Vitamin D deficiency Stable. Continue Vitamin D 50,000 units weekly.    8. CKD (chronic kidney disease) stage 3, GFR 30-59 ml/min Stable. Avoid nephrotoxic agents. Continue to monitor renal function.   Family/Staff Communication Plan of care discussed with resident and nursing staff. Resident and nursing staff verbalized understanding and agree with plan of care. No additional questions or concerns reported.    Loura Back, MSN, AGNP-C [facility] [address] [place], [place] [address] [phone] [8am-5pm] After hours: [phone]

## 2014-11-23 ENCOUNTER — Non-Acute Institutional Stay (SKILLED_NURSING_FACILITY): Payer: Medicare Other | Admitting: Nurse Practitioner

## 2014-11-23 DIAGNOSIS — K5909 Other constipation: Secondary | ICD-10-CM | POA: Diagnosis not present

## 2014-11-23 DIAGNOSIS — I10 Essential (primary) hypertension: Secondary | ICD-10-CM

## 2014-11-23 DIAGNOSIS — M1990 Unspecified osteoarthritis, unspecified site: Secondary | ICD-10-CM | POA: Diagnosis not present

## 2014-11-23 DIAGNOSIS — F0390 Unspecified dementia without behavioral disturbance: Secondary | ICD-10-CM | POA: Diagnosis not present

## 2014-11-23 DIAGNOSIS — E559 Vitamin D deficiency, unspecified: Secondary | ICD-10-CM | POA: Diagnosis not present

## 2014-11-23 DIAGNOSIS — E039 Hypothyroidism, unspecified: Secondary | ICD-10-CM | POA: Diagnosis not present

## 2014-11-23 NOTE — Progress Notes (Signed)
Patient ID: Shannon Gomez, female   DOB: 02/04/1914, 81 y.o.   MRN: [id number]    Nursing Home Location:  [facility]   Place of Service: SNF (31)  PCP: Oneal GroutPANDEY, MAHIMA, MD  Allergies  Allergen Reactions  . Sulfonamide Derivatives     Chief Complaint  Patient presents with  . Medical Management of Chronic Issues    HPI:  Patient is a 81 y.o. female seen today at [facility] for medical management of chronic conditions. Pt with a pmh of dementia, hypothyroidism, HTN, CAD, chronic pain. Pt has been stable in the last month.. Pts weight has overall remained stable. Blood pressure overall has improved since the start of norvasc. No adverse effects noted from medication.  Constipation well managed with medication.Nursing without any acute concerns.   Review of Systems:  Review of Systems  Unable to perform ROS: Dementia    Past Medical History  Diagnosis Date  . Thyroid disease   . Hypertension   . GERD (gastroesophageal reflux disease)   . Vitamin D deficiency   . Dementia   . Chronic pain   . Chronic kidney disease   . HCAP (healthcare-associated pneumonia) 05/17/2013  . GASTRIC ULCER 01/01/2008    Qualifier: Diagnosis of  By: Andrey CampanileWilson MD, Raliegh IpLauraLee    . DIVERTICULITIS, HX OF 01/01/2008    Qualifier: Diagnosis of  By: Andrey CampanileWilson MD, Raliegh IpLauraLee    . COLONIC POLYPS, HX OF 01/01/2008    Qualifier: Diagnosis of  By: Andrey CampanileWilson MD, LauraLee     No past surgical history on file. Social History:   has no tobacco, alcohol, and drug history on file.  No family history on file.  Medications: Patient's Medications  New Prescriptions   No medications on file  Previous Medications   AMLODIPINE (NORVASC) 5 MG TABLET    Take 5 mg by mouth daily.   ARTIFICIAL SALIVA (BIOTENE MOISTURIZING MOUTH) SOLN    Use as directed 10 sprays in the mouth or throat 4 (four) times daily as needed.   BISACODYL (DULCOLAX) 10 MG SUPPOSITORY    Place 10 mg rectally daily as  needed.    CALCIUM CARBONATE (OS-CAL) 600 MG TABS TABLET    Take 600 mg by mouth 2 (two) times daily with a meal.   LEVOTHYROXINE (SYNTHROID, LEVOTHROID) 25 MCG TABLET    Take 25 mcg by mouth daily before breakfast.   SENNOSIDES-DOCUSATE SODIUM (SENOKOT-S) 8.6-50 MG TABLET    Take 1 tablet by mouth daily.    TRAMADOL (ULTRAM) 50 MG TABLET    Take 50-100 mg by mouth every 8 (eight) hours as needed.   VITAMIN D, ERGOCALCIFEROL, (DRISDOL) 50000 UNITS CAPS CAPSULE    Take 50,000 Units by mouth every 7 (seven) days.  Modified Medications   No medications on file  Discontinued Medications   No medications on file     Physical Exam: Filed Vitals:   11/23/14 1544  BP: 147/57  Pulse: 64  Temp: 98.2 F (36.8 C)  Resp: 20  Weight: 132 lb (59.875 kg)    Physical Exam  Constitutional: She is oriented to person, place, and time. No distress.  Frail female, NAD  HENT:  Head: Normocephalic and atraumatic.  Mouth/Throat: Oropharynx is clear and moist. No oropharyngeal exudate.  Eyes: Conjunctivae are normal. Pupils are equal, round, and reactive to light.  Neck: Normal range of motion. Neck supple.  Cardiovascular: Normal rate, regular rhythm and normal heart sounds.   Pulmonary/Chest: Effort normal  and breath sounds normal.  Abdominal: Soft. Bowel sounds are normal.  Musculoskeletal: She exhibits no edema or tenderness.  Neurological: She is alert and oriented to person, place, and time.  Skin: Skin is warm and dry. She is not diaphoretic.  Psychiatric:  Memory loss    Labs reviewed: Basic Metabolic Panel:  Recent Labs  09/81/19 08/04/14  NA 139 135*  K 4.3 4.4  BUN 42* 33*  CREATININE 1.1 1.0   Liver Function Tests: No results for input(s): AST, ALT, ALKPHOS, BILITOT, PROT, ALBUMIN in the last 8760 hours. No results for input(s): LIPASE, AMYLASE in the last 8760 hours. No results for input(s): AMMONIA in the last 8760 hours. CBC:  Recent Labs  06/01/14 08/04/14  WBC 13.3  10.9  HGB 14.5 13.6  HCT 44 40  PLT 220 234   TSH:  Recent Labs  01/22/14 08/04/14  TSH 5.36 3.98   A1C: No results found for: HGBA1C Lipid Panel: No results for input(s): CHOL, HDL, LDLCALC, TRIG, CHOLHDL, LDLDIRECT in the last 8760 hours.   Assessment/Plan 1. Essential hypertension, benign Started on norvasc 5 mg daily in May, blood pressure stable at this time due to age and co-morbities will monitor at this time.   2. Hypothyroidism, unspecified hypothyroidism type TSH stable at 3.98 in march of 2016  3. Dementia without behavioral disturbance Advanced disease, no acute changes of cognitive or functional status in the last month but slow decline over time. Expect further decline due to progressive nature of disease  4. Other constipation Controlled on current regimen  5. Osteoarthritis, unspecified osteoarthritis type, unspecified site Pain controlled on ultram as needed  6. Vit D def -conts on vit d 50000 units weekly  Reagann Dolce K. Biagio Borg  [facility] [phone] 8 am - 5 pm) [phone] (after hours)

## 2014-12-07 DIAGNOSIS — I70203 Unspecified atherosclerosis of native arteries of extremities, bilateral legs: Secondary | ICD-10-CM | POA: Diagnosis not present

## 2014-12-07 DIAGNOSIS — M79674 Pain in right toe(s): Secondary | ICD-10-CM | POA: Diagnosis not present

## 2014-12-07 DIAGNOSIS — B351 Tinea unguium: Secondary | ICD-10-CM | POA: Diagnosis not present

## 2014-12-07 DIAGNOSIS — M79675 Pain in left toe(s): Secondary | ICD-10-CM | POA: Diagnosis not present

## 2015-01-02 ENCOUNTER — Non-Acute Institutional Stay (SKILLED_NURSING_FACILITY): Payer: Medicare Other | Admitting: Nurse Practitioner

## 2015-01-02 DIAGNOSIS — K5909 Other constipation: Secondary | ICD-10-CM

## 2015-01-02 DIAGNOSIS — I10 Essential (primary) hypertension: Secondary | ICD-10-CM | POA: Diagnosis not present

## 2015-01-02 DIAGNOSIS — E039 Hypothyroidism, unspecified: Secondary | ICD-10-CM

## 2015-01-02 DIAGNOSIS — R682 Dry mouth, unspecified: Secondary | ICD-10-CM

## 2015-01-02 DIAGNOSIS — M1990 Unspecified osteoarthritis, unspecified site: Secondary | ICD-10-CM

## 2015-01-02 DIAGNOSIS — M810 Age-related osteoporosis without current pathological fracture: Secondary | ICD-10-CM

## 2015-01-02 NOTE — Progress Notes (Signed)
This encounter was created in error - please disregard.

## 2015-01-02 NOTE — Progress Notes (Signed)
Patient ID: Shannon Gomez, female   DOB: May 08, 1913, 81 y.o.   MRN: [id number]    PCP: Oneal Grout, MD  Allergies  Allergen Reactions  . Sulfonamide Derivatives     Chief Complaint  Patient presents with  . Medical Management of Chronic Issues     HPI: Patient is a 81 y.o. female seen today for medical management of her chronic issues.  She has a past medical history of hypertension, COPD, hypothyroidism, and dementia.  Pt with dementia therefore ROS and HPI limited. Nursing does not have any acute concerns. She is seen today in her room while eating her lunch. Weight has been stable. Feeding herself. Requires assistance with some ADLs She does not have any concerns and feels well.     Review of Systems:  Review of Systems  Constitutional: Negative for fever, chills and activity change.  HENT: Negative for congestion, postnasal drip and rhinorrhea.   Respiratory: Negative for cough and shortness of breath.   Cardiovascular: Negative for chest pain, palpitations and leg swelling.  Gastrointestinal: Negative for nausea, diarrhea and constipation.  Genitourinary: Negative for dysuria and frequency.  Musculoskeletal: Negative for myalgias and back pain.  Skin: Negative.   Neurological: Negative for dizziness and headaches.  Psychiatric/Behavioral: Positive for confusion. Negative for agitation. The patient is not nervous/anxious.     Past Medical History  Diagnosis Date  . Thyroid disease   . Hypertension   . GERD (gastroesophageal reflux disease)   . Vitamin D deficiency   . Dementia   . Chronic pain   . Chronic kidney disease   . HCAP (healthcare-associated pneumonia) 05/17/2013  . GASTRIC ULCER 01/01/2008    Qualifier: Diagnosis of  By: Andrey Campanile MD, Raliegh Ip    . DIVERTICULITIS, HX OF 01/01/2008    Qualifier: Diagnosis of  By: Andrey Campanile MD, Raliegh Ip    . COLONIC POLYPS, HX OF 01/01/2008    Qualifier: Diagnosis of  By: Andrey Campanile MD, LauraLee     No past surgical history on  file. Social History:   has no tobacco, alcohol, and drug history on file.  No family history on file.  Medications: Patient's Medications  New Prescriptions   No medications on file  Previous Medications   AMLODIPINE (NORVASC) 5 MG TABLET    Take 5 mg by mouth daily.   ARTIFICIAL SALIVA (BIOTENE MOISTURIZING MOUTH) SOLN    Use as directed 10 sprays in the mouth or throat 4 (four) times daily as needed.   CALCIUM CARBONATE (OS-CAL) 600 MG TABS TABLET    Take 600 mg by mouth 2 (two) times daily with a meal.   LEVOTHYROXINE (SYNTHROID, LEVOTHROID) 25 MCG TABLET    Take 25 mcg by mouth daily before breakfast.   SENNOSIDES-DOCUSATE SODIUM (SENOKOT-S) 8.6-50 MG TABLET    Take 1 tablet by mouth daily.    VITAMIN D, ERGOCALCIFEROL, (DRISDOL) 50000 UNITS CAPS CAPSULE    Take 50,000 Units by mouth every 7 (seven) days.  Modified Medications   No medications on file  Discontinued Medications   No medications on file     Physical Exam:  Filed Vitals:   01/02/15 1249  BP: 149/71  Pulse: 67  Temp: 97.8 F (36.6 C)  TempSrc: Oral  Resp: 19  Weight: 131 lb (59.421 kg)    Physical Exam  Constitutional: She appears well-developed and well-nourished.  HENT:  Head: Normocephalic and atraumatic.  Eyes: Pupils are equal, round, and reactive to light.  Neck: Normal range of motion. Neck supple.  Cardiovascular:  Normal rate, regular rhythm and normal heart sounds.   Pulmonary/Chest: Effort normal and breath sounds normal.  Abdominal: Soft. Bowel sounds are normal. She exhibits no distension.  Musculoskeletal: Normal range of motion.  Neurological: She is alert.  Skin: Skin is warm and dry.    Labs reviewed: Basic Metabolic Panel:  Recent Labs  13/08/65 03/08/14 08/04/14  NA  --  139 135*  K  --  4.3 4.4  BUN  --  42* 33*  CREATININE  --  1.1 1.0  TSH 5.36  --  3.98   Liver Function Tests: No results for input(s): AST, ALT, ALKPHOS, BILITOT, PROT, ALBUMIN in the last 8760  hours. No results for input(s): LIPASE, AMYLASE in the last 8760 hours. No results for input(s): AMMONIA in the last 8760 hours. CBC:  Recent Labs  06/01/14 08/04/14  WBC 13.3 10.9  HGB 14.5 13.6  HCT 44 40  PLT 220 234   Lipid Panel: No results for input(s): CHOL, HDL, LDLCALC, TRIG, CHOLHDL, LDLDIRECT in the last 8760 hours. TSH:  Recent Labs  01/22/14 08/04/14  TSH 5.36 3.98   A1C: No results found for: HGBA1C   Assessment/Plan 1. Essential hypertension, benign - Stable.  Maintained on amlodipine.  Blood pressures are within desirable range for age. Continue same.    2. Constipation - Stable.  Patient and nursing without concerns .  Continue Senokot daily.    3. Hypothyroidism, unspecified hypothyroidism type - Stable on levothyroxine.  Continue same.   4. Osteoporosis  - Bone density scan completed this year showing OP.   Stable.  Continues taking calcium carbonate with Vit D  5. Dry mouth - Stable.  Continue using Biotene as needed.     Janene Harvey. Biagio Borg  [facility] [phone] 8 am - 5 pm) [phone] (after hours)

## 2015-01-11 ENCOUNTER — Non-Acute Institutional Stay (SKILLED_NURSING_FACILITY): Payer: Medicare Other | Admitting: Nurse Practitioner

## 2015-01-11 DIAGNOSIS — H6002 Abscess of left external ear: Secondary | ICD-10-CM

## 2015-01-11 NOTE — Progress Notes (Signed)
Patient ID: Shannon Gomez, female   DOB: [birth year], 81 y.o.   MRN: [id number]    Nursing Home Location:  [facility]   Place of Service: SNF (31)  PCP: Oneal Grout, MD  Allergies  Allergen Reactions  . Sulfonamide Derivatives     Chief Complaint  Patient presents with  . Acute Visit    HPI:  Patient is a 81 y.o. female seen today at [facility] for an erythematous, pustule boil on her left ear. The left earlobe is painful to touch and the antitragus has a 3-4 cm yellow boil.  The erythema and swelling extend down into the earlobe.  Patient endorses pain and says she does not feel well.  No drainage from ear.  No fevers.   Review of Systems:  Review of Systems  Constitutional: Negative for fever, chills and fatigue.  HENT: Positive for ear pain. Negative for ear discharge and facial swelling.   Respiratory: Negative for cough, shortness of breath and wheezing.   Skin: Positive for color change (erythema on left ear. ). Negative for rash.  Psychiatric/Behavioral: Negative.     Past Medical History  Diagnosis Date  . Thyroid disease   . Hypertension   . GERD (gastroesophageal reflux disease)   . Vitamin D deficiency   . Dementia   . Chronic pain   . Chronic kidney disease   . HCAP (healthcare-associated pneumonia) 05/17/2013  . GASTRIC ULCER 01/01/2008    Qualifier: Diagnosis of  By: Andrey Campanile MD, Raliegh Ip    . DIVERTICULITIS, HX OF 01/01/2008    Qualifier: Diagnosis of  By: Andrey Campanile MD, Raliegh Ip    . COLONIC POLYPS, HX OF 01/01/2008    Qualifier: Diagnosis of  By: Andrey Campanile MD, LauraLee     No past surgical history on file. Social History:   has no tobacco, alcohol, and drug history on file.  No family history on file.  Medications: Patient's Medications  New Prescriptions   No medications on file  Previous Medications   AMLODIPINE (NORVASC) 5 MG TABLET    Take 5 mg by mouth daily.   ARTIFICIAL SALIVA (BIOTENE MOISTURIZING  MOUTH) SOLN    Use as directed 10 sprays in the mouth or throat 4 (four) times daily as needed.   CALCIUM CARBONATE (OS-CAL) 600 MG TABS TABLET    Take 600 mg by mouth 2 (two) times daily with a meal.   LEVOTHYROXINE (SYNTHROID, LEVOTHROID) 25 MCG TABLET    Take 25 mcg by mouth daily before breakfast.   SENNOSIDES-DOCUSATE SODIUM (SENOKOT-S) 8.6-50 MG TABLET    Take 1 tablet by mouth daily.    VITAMIN D, ERGOCALCIFEROL, (DRISDOL) 50000 UNITS CAPS CAPSULE    Take 50,000 Units by mouth every 7 (seven) days.  Modified Medications   No medications on file  Discontinued Medications   No medications on file     Physical Exam: Filed Vitals:   01/11/15 1501  BP: 118/58  Pulse: 52  Temp: 97.6 F (36.4 C)  Resp: 18    Physical Exam  Constitutional: She appears well-developed. No distress.  HENT:  Left Ear: There is swelling and tenderness.  Ears:  Cardiovascular: Normal rate, regular rhythm and normal heart sounds.   Pulmonary/Chest: Effort normal and breath sounds normal.  Lymphadenopathy:    She has no cervical adenopathy.  Skin: She is not diaphoretic.    Labs reviewed: Basic Metabolic Panel:  Recent Labs  40/98/11 08/04/14  NA 139 135*  K 4.3  4.4  BUN 42* 33*  CREATININE 1.1 1.0   Liver Function Tests: No results for input(s): AST, ALT, ALKPHOS, BILITOT, PROT, ALBUMIN in the last 8760 hours. No results for input(s): LIPASE, AMYLASE in the last 8760 hours. No results for input(s): AMMONIA in the last 8760 hours. CBC:  Recent Labs  06/01/14 08/04/14  WBC 13.3 10.9  HGB 14.5 13.6  HCT 44 40  PLT 220 234   TSH:  Recent Labs  01/22/14 08/04/14  TSH 5.36 3.98   A1C: No results found for: HGBA1C Lipid Panel: No results for input(s): CHOL, HDL, LDLCALC, TRIG, CHOLHDL, LDLDIRECT in the last 8760 hours.  Assessment/Plan 1. Left ear abscess - Doxycycline  po bid for 10 days -Apply warm compress to ear for 10 min four times a day until improved -tramadol  50-100 mg PO q 6 hours PRN pain  - Nursing to monitor, notify as needed.     Janene Harvey. Biagio Borg  [facility] [phone] 8 am - 5 pm) [phone] (after hours)

## 2015-02-01 ENCOUNTER — Non-Acute Institutional Stay (SKILLED_NURSING_FACILITY): Payer: Medicare Other | Admitting: Nurse Practitioner

## 2015-02-01 DIAGNOSIS — I10 Essential (primary) hypertension: Secondary | ICD-10-CM

## 2015-02-01 DIAGNOSIS — G8929 Other chronic pain: Secondary | ICD-10-CM | POA: Diagnosis not present

## 2015-02-01 DIAGNOSIS — R682 Dry mouth, unspecified: Secondary | ICD-10-CM

## 2015-02-01 DIAGNOSIS — F0390 Unspecified dementia without behavioral disturbance: Secondary | ICD-10-CM | POA: Diagnosis not present

## 2015-02-01 DIAGNOSIS — E039 Hypothyroidism, unspecified: Secondary | ICD-10-CM

## 2015-02-01 NOTE — Progress Notes (Signed)
Patient ID: Shannon Gomez, female   DOB: 1914-01-17, 81 y.o.   MRN: [id number]    Nursing Home Location:  [facility]   Place of Service: SNF (31)  PCP: Oneal Grout, MD  Allergies  Allergen Reactions  . Sulfonamide Derivatives     Chief Complaint  Patient presents with  . Medical Management of Chronic Issues    HPI:  Patient is a 81 y.o. female seen today at [facility] for medical management of her chronic issues.  She has a past medical history of HTN, GERD, hypothyroidism, chronic pain and dementia.  Patient is seen today in her room,she is alert and answers some questions appropriately.  She says that she does not feel well today but cannot verbalize exactly what is wrong.  No complaints of pain.  She is eating fairly well according to nursing. No other acute concerns.  Patient was seen 2 weeks ago for boil on her left ear and antibiotics were prescribed, ear is without infectious boil today and infection has resolved.   Review of Systems: Limited due patient's advanced dementia.   Review of Systems  Constitutional: Positive for fatigue.  Respiratory: Negative for cough and shortness of breath.   Cardiovascular: Negative for chest pain and leg swelling.  Gastrointestinal: Negative for abdominal pain.    Past Medical History  Diagnosis Date  . Thyroid disease   . Hypertension   . GERD (gastroesophageal reflux disease)   . Vitamin D deficiency   . Dementia   . Chronic pain   . Chronic kidney disease   . HCAP (healthcare-associated pneumonia) 05/17/2013  . GASTRIC ULCER 01/01/2008    Qualifier: Diagnosis of  By: Andrey Campanile MD, Raliegh Ip    . DIVERTICULITIS, HX OF 01/01/2008    Qualifier: Diagnosis of  By: Andrey Campanile MD, Raliegh Ip    . COLONIC POLYPS, HX OF 01/01/2008    Qualifier: Diagnosis of  By: Andrey Campanile MD, LauraLee     No past surgical history on file. Social History:   has no tobacco, alcohol, and drug history on file.  No family  history on file.  Medications: Patient's Medications  New Prescriptions   No medications on file  Previous Medications   AMLODIPINE (NORVASC) 5 MG TABLET    Take 5 mg by mouth daily.   ARTIFICIAL SALIVA (BIOTENE MOISTURIZING MOUTH) SOLN    Use as directed 10 sprays in the mouth or throat 4 (four) times daily as needed.   CALCIUM CARBONATE (OS-CAL) 600 MG TABS TABLET    Take 600 mg by mouth 2 (two) times daily with a meal.   LEVOTHYROXINE (SYNTHROID, LEVOTHROID) 25 MCG TABLET    Take 25 mcg by mouth daily before breakfast.   SENNOSIDES-DOCUSATE SODIUM (SENOKOT-S) 8.6-50 MG TABLET    Take 1 tablet by mouth daily.    VITAMIN D, ERGOCALCIFEROL, (DRISDOL) 50000 UNITS CAPS CAPSULE    Take 50,000 Units by mouth every 7 (seven) days.  Modified Medications   No medications on file  Discontinued Medications   No medications on file     Physical Exam: Filed Vitals:   02/01/15 1105  BP: 133/74  Pulse: 78  Temp: 98.5 F (36.9 C)  TempSrc: Oral  Resp: 20  Weight: 130 lb 14.4 oz (59.376 kg)    Physical Exam  Constitutional: No distress.  Frail elderly female  HENT:  Head: Normocephalic and atraumatic.  Right Ear: External ear normal.  Left Ear: External ear normal.  Eyes: Conjunctivae are  normal. Pupils are equal, round, and reactive to light.  Neck: Normal range of motion. Neck supple.  Cardiovascular: Normal rate, regular rhythm and normal heart sounds.   No murmur heard. Pulmonary/Chest: Effort normal and breath sounds normal. She has no wheezes.  Abdominal: Soft. Bowel sounds are normal. She exhibits no distension. There is no tenderness.  Musculoskeletal: Normal range of motion. She exhibits no edema or tenderness.  Lymphadenopathy:    She has no cervical adenopathy.  Neurological: She is alert.  Skin: Skin is warm and dry. She is not diaphoretic.    Labs reviewed: Basic Metabolic Panel:  Recent Labs  16/10/96 08/04/14  NA 139 135*  K 4.3 4.4  BUN 42* 33*  CREATININE  1.1 1.0   Liver Function Tests: No results for input(s): AST, ALT, ALKPHOS, BILITOT, PROT, ALBUMIN in the last 8760 hours. No results for input(s): LIPASE, AMYLASE in the last 8760 hours. No results for input(s): AMMONIA in the last 8760 hours. CBC:  Recent Labs  06/01/14 08/04/14  WBC 13.3 10.9  HGB 14.5 13.6  HCT 44 40  PLT 220 234   TSH:  Recent Labs  08/04/14  TSH 3.98   A1C: No results found for: HGBA1C Lipid Panel: No results for input(s): CHOL, HDL, LDLCALC, TRIG, CHOLHDL, LDLDIRECT in the last 8760 hours.  Assessment/Plan 1. Essential hypertension, benign Stable.  Blood pressures within desirable range.  Patient currently maintained on amlodipine. No complaints of chest pain.  Will check CMP.    2. Dry mouth Stable. Continue Biotene as needed.    3. Hypothyroidism, unspecified hypothyroidism type Stable. Will check TSH.  Continue current dose of synthroid pending labs.    4. Chronic pain Stable.  Patient has no complaints of pain today.  May take tramadol as needed for pain.    5. Dementia without behavioral disturbance Stable. Anticipates decline with progression of disease.  Patient is pleasant, she is still able to participate in ADL's, feeds herself.        Janene Harvey. Biagio Borg  [facility] [phone] 8 am - 5 pm) [phone] (after hours)

## 2015-02-02 DIAGNOSIS — E039 Hypothyroidism, unspecified: Secondary | ICD-10-CM | POA: Diagnosis not present

## 2015-02-02 DIAGNOSIS — I158 Other secondary hypertension: Secondary | ICD-10-CM | POA: Diagnosis not present

## 2015-02-02 LAB — BASIC METABOLIC PANEL
BUN: 25 mg/dL — AB (ref 4–21)
CREATININE: 1 mg/dL (ref 0.5–1.1)
Potassium: 4 mmol/L (ref 3.4–5.3)
Sodium: 135 mmol/L — AB (ref 137–147)

## 2015-02-02 LAB — CBC AND DIFFERENTIAL
HEMATOCRIT: 44 % (ref 36–46)
HEMOGLOBIN: 14.4 g/dL (ref 12.0–16.0)
PLATELETS: 236 10*3/uL (ref 150–399)
WBC: 11.1 10^3/mL

## 2015-02-02 LAB — TSH: TSH: 2.69 u[IU]/mL (ref 0.41–5.90)

## 2015-02-14 DIAGNOSIS — Z23 Encounter for immunization: Secondary | ICD-10-CM | POA: Diagnosis not present

## 2015-02-20 DIAGNOSIS — I2510 Atherosclerotic heart disease of native coronary artery without angina pectoris: Secondary | ICD-10-CM | POA: Diagnosis not present

## 2015-02-20 DIAGNOSIS — B351 Tinea unguium: Secondary | ICD-10-CM | POA: Diagnosis not present

## 2015-02-20 DIAGNOSIS — M79671 Pain in right foot: Secondary | ICD-10-CM | POA: Diagnosis not present

## 2015-02-20 DIAGNOSIS — M79672 Pain in left foot: Secondary | ICD-10-CM | POA: Diagnosis not present

## 2015-03-08 ENCOUNTER — Non-Acute Institutional Stay (SKILLED_NURSING_FACILITY): Payer: Medicare Other | Admitting: Nurse Practitioner

## 2015-03-08 DIAGNOSIS — M810 Age-related osteoporosis without current pathological fracture: Secondary | ICD-10-CM | POA: Diagnosis not present

## 2015-03-08 DIAGNOSIS — B372 Candidiasis of skin and nail: Secondary | ICD-10-CM | POA: Diagnosis not present

## 2015-03-08 DIAGNOSIS — E039 Hypothyroidism, unspecified: Secondary | ICD-10-CM

## 2015-03-08 DIAGNOSIS — F0390 Unspecified dementia without behavioral disturbance: Secondary | ICD-10-CM

## 2015-03-08 DIAGNOSIS — N183 Chronic kidney disease, stage 3 unspecified: Secondary | ICD-10-CM

## 2015-03-08 DIAGNOSIS — E559 Vitamin D deficiency, unspecified: Secondary | ICD-10-CM

## 2015-03-08 DIAGNOSIS — I10 Essential (primary) hypertension: Secondary | ICD-10-CM

## 2015-03-08 NOTE — Progress Notes (Signed)
Patient ID: Shannon Gomez, female   DOB: 04-04-1914, 81 y.o.   MRN: [id number]    Nursing Home Location:  [facility]   Place of Service: SNF (31)  PCP: Oneal Grout, MD  Allergies  Allergen Reactions  . Sulfonamide Derivatives     Chief Complaint  Patient presents with  . Medical Management of Chronic Issues    HPI:  Patient is a 81 y.o. female seen today at [facility] for medical management of her chronic issues.  She has a past medical history of HTN, GERD, hypothyroidism, chronic pain and dementia.  Pt has been doing well in the last month. Recently with yeast noted under breast. Nystatin cream being applied with good results. Mild redness noted under left breast at this time. Pt denies pain. Reports she is eating well. No constipation. Pt reports she is healthy. Staff has no concerns at this time.    Review of Systems: Limited due patient's advanced dementia, provided by staff and pt.   Review of Systems  Constitutional: Negative for activity change, appetite change and fatigue.  HENT: Negative for congestion.   Eyes: Negative.   Respiratory: Negative for cough and shortness of breath.   Cardiovascular: Negative for chest pain and leg swelling.  Gastrointestinal: Negative for abdominal pain, diarrhea and constipation.  Genitourinary: Negative for dysuria.  Musculoskeletal: Negative for myalgias and arthralgias.  Skin: Negative for color change and wound.  Neurological: Negative for dizziness.  Psychiatric/Behavioral: Positive for confusion and agitation (at times). Negative for behavioral problems.    Past Medical History  Diagnosis Date  . Thyroid disease   . Hypertension   . GERD (gastroesophageal reflux disease)   . Vitamin D deficiency   . Dementia   . Chronic pain   . Chronic kidney disease   . HCAP (healthcare-associated pneumonia) 05/17/2013  . GASTRIC ULCER 01/01/2008    Qualifier: Diagnosis of  By: Andrey Campanile MD,  Raliegh Ip    . DIVERTICULITIS, HX OF 01/01/2008    Qualifier: Diagnosis of  By: Andrey Campanile MD, Raliegh Ip    . COLONIC POLYPS, HX OF 01/01/2008    Qualifier: Diagnosis of  By: Andrey Campanile MD, LauraLee     No past surgical history on file. Social History:   has no tobacco, alcohol, and drug history on file.  No family history on file.  Medications: Patient's Medications  New Prescriptions   No medications on file  Previous Medications   AMLODIPINE (NORVASC) 5 MG TABLET    Take 5 mg by mouth daily.   ARTIFICIAL SALIVA (BIOTENE MOISTURIZING MOUTH) SOLN    Use as directed 10 sprays in the mouth or throat 4 (four) times daily as needed.   CALCIUM CARBONATE (OS-CAL) 600 MG TABS TABLET    Take 600 mg by mouth 2 (two) times daily with a meal.   LEVOTHYROXINE (SYNTHROID, LEVOTHROID) 25 MCG TABLET    Take 25 mcg by mouth daily before breakfast.   SENNOSIDES-DOCUSATE SODIUM (SENOKOT-S) 8.6-50 MG TABLET    Take 1 tablet by mouth daily.    VITAMIN D, ERGOCALCIFEROL, (DRISDOL) 50000 UNITS CAPS CAPSULE    Take 50,000 Units by mouth every 7 (seven) days.  Modified Medications   No medications on file  Discontinued Medications   No medications on file     Physical Exam: Filed Vitals:   03/08/15 1547  BP: 112/64  Pulse: 80  Temp: 97.6 F (36.4 C)  Resp: 20  Weight: 131 lb (59.421 kg)  Physical Exam  Constitutional: No distress.  Frail elderly female  HENT:  Head: Normocephalic and atraumatic.  Right Ear: External ear normal.  Left Ear: External ear normal.  Mouth/Throat: Oropharynx is clear and moist. No oropharyngeal exudate.  Eyes: Conjunctivae are normal. Pupils are equal, round, and reactive to light.  Neck: Normal range of motion. Neck supple.  Cardiovascular: Normal rate, regular rhythm and normal heart sounds.   No murmur heard. Pulmonary/Chest: Effort normal and breath sounds normal. She has no wheezes.  Abdominal: Soft. Bowel sounds are normal. She exhibits no distension. There is no  tenderness.  Musculoskeletal: Normal range of motion. She exhibits no edema or tenderness.  Neurological: She is alert.  Skin: Skin is warm and dry. She is not diaphoretic.    Labs reviewed: Basic Metabolic Panel:  Recent Labs  16/02/9602/31/16 02/02/15  NA 135* 135*  K 4.4 4.0  BUN 33* 25*  CREATININE 1.0 1.0   Liver Function Tests: No results for input(s): AST, ALT, ALKPHOS, BILITOT, PROT, ALBUMIN in the last 8760 hours. No results for input(s): LIPASE, AMYLASE in the last 8760 hours. No results for input(s): AMMONIA in the last 8760 hours. CBC:  Recent Labs  06/01/14 08/04/14 02/02/15  WBC 13.3 10.9 11.1  HGB 14.5 13.6 14.4  HCT 44 40 44  PLT 220 234 236   TSH:  Recent Labs  08/04/14 02/02/15  TSH 3.98 2.69   A1C: No results found for: HGBA1C Lipid Panel: No results for input(s): CHOL, HDL, LDLCALC, TRIG, CHOLHDL, LDLDIRECT in the last 8760 hours.  Assessment/Plan 1. Essential hypertension, benign Controlled on norvasc 5 mg daily  2. Vitamin D deficiency Currently on Vit D 50,000 units daily, will follow up Vit D Level  3. Hypothyroidism, unspecified hypothyroidism type Tsh stable at 2.69 in September, to cont on synthroid 25 mcg daily  4. Dementia without behavioral disturbance -stable, without worsening cognitive or functional decline.   5. Osteoporosis -stable, conts on vit d and calcium   6. CKD (chronic kidney disease) stage 3, GFR 30-59 ml/min BUN/Cr stable on recent labs. Will provide ongoing monitoring. Avoid NSAIDs and encourage hydration.   7. Candidiasis of skin -improved, conts on nystatin until resolved      Ismahan Lippman K. Biagio BorgEubanks, AGNP  [facility] [phone](Monday-Friday 8 am - 5 pm) [phone] (after hours)

## 2015-03-09 DIAGNOSIS — E559 Vitamin D deficiency, unspecified: Secondary | ICD-10-CM | POA: Diagnosis not present

## 2015-03-09 DIAGNOSIS — E58 Dietary calcium deficiency: Secondary | ICD-10-CM | POA: Diagnosis not present

## 2015-04-28 ENCOUNTER — Non-Acute Institutional Stay (SKILLED_NURSING_FACILITY): Payer: Medicare Other | Admitting: Adult Health

## 2015-04-28 DIAGNOSIS — E038 Other specified hypothyroidism: Secondary | ICD-10-CM

## 2015-04-28 DIAGNOSIS — E034 Atrophy of thyroid (acquired): Secondary | ICD-10-CM | POA: Diagnosis not present

## 2015-04-28 DIAGNOSIS — E559 Vitamin D deficiency, unspecified: Secondary | ICD-10-CM | POA: Diagnosis not present

## 2015-04-28 DIAGNOSIS — J449 Chronic obstructive pulmonary disease, unspecified: Secondary | ICD-10-CM

## 2015-04-28 DIAGNOSIS — I10 Essential (primary) hypertension: Secondary | ICD-10-CM

## 2015-04-30 ENCOUNTER — Encounter: Payer: Self-pay | Admitting: Adult Health

## 2015-04-30 DIAGNOSIS — J449 Chronic obstructive pulmonary disease, unspecified: Secondary | ICD-10-CM | POA: Insufficient documentation

## 2015-04-30 DIAGNOSIS — E559 Vitamin D deficiency, unspecified: Secondary | ICD-10-CM | POA: Insufficient documentation

## 2015-04-30 NOTE — Progress Notes (Signed)
Patient ID: Shannon Gomez, female   DOB: 1913/07/16, 81 y.o.   MRN: [id number]    Facility: [facility]      Allergies  Allergen Reactions  . Sulfonamide Derivatives     Chief Complaint  Patient presents with  . Medical Management of Chronic Issues    HPI:  She is a long term resident of this facility being seen for the management of her chronic illnesses. Overall there is little change in her status. She is unable to fully participate in the hpi or ros; but states that she is feeling good. There are no nursing concerns today.     Past Medical History  Diagnosis Date  . Thyroid disease   . Hypertension   . GERD (gastroesophageal reflux disease)   . Vitamin D deficiency   . Dementia   . Chronic pain   . Chronic kidney disease   . HCAP (healthcare-associated pneumonia) 05/17/2013  . GASTRIC ULCER 01/01/2008    Qualifier: Diagnosis of  By: Andrey Campanile MD, Raliegh Ip    . DIVERTICULITIS, HX OF 01/01/2008    Qualifier: Diagnosis of  By: Andrey Campanile MD, Raliegh Ip    . COLONIC POLYPS, HX OF 01/01/2008    Qualifier: Diagnosis of  By: Andrey Campanile MD, Raliegh Ip      History reviewed. No pertinent past surgical history.  VITAL SIGNS BP 142/70 mmHg  Pulse 82  Resp 18  SpO2 95%  Patient's Medications  New Prescriptions   No medications on file  Previous Medications   AMLODIPINE (NORVASC) 5 MG TABLET    Take 5 mg by mouth daily.   ARTIFICIAL SALIVA (BIOTENE MOISTURIZING MOUTH) SOLN    Use as directed 10 sprays in the mouth or throat 4 (four) times daily as needed.   CALCIUM CARBONATE (OS-CAL) 600 MG TABS TABLET    Take 600 mg by mouth 2 (two) times daily with a meal.   LEVOTHYROXINE (SYNTHROID, LEVOTHROID) 25 MCG TABLET    Take 25 mcg by mouth daily before breakfast.   SENNOSIDES-DOCUSATE SODIUM (SENOKOT-S) 8.6-50 MG TABLET    Take 1 tablet by mouth daily.    TRAMADOL (ULTRAM) 50 MG TABLET    Take 50-100 mg by mouth every 6 (six) hours as needed.   VITAMIN D, ERGOCALCIFEROL, (DRISDOL)  50000 UNITS CAPS CAPSULE    Take 50,000 Units by mouth every 7 (seven) days.  Modified Medications   No medications on file  Discontinued Medications   No medications on file     SIGNIFICANT DIAGNOSTIC EXAMS  LABS REVIEWED:   02-02-15: wbc 11.1; hgb 14.4; hct 44.2; mcv 91.7; plt 236; glucose 74; bun 25; creat 0.98; k+ 4.0; na++135; liver normal albumin 3.34; tsh 2.696 03-09-15: vit d: 90.67   Review of Systems  Unable to perform ROS: dementia    Physical Exam  Constitutional: No distress.  Eyes: Conjunctivae are normal.  Neck: Neck supple. No JVD present. No thyromegaly present.  Cardiovascular: Normal rate, regular rhythm and intact distal pulses.   Respiratory: Effort normal and breath sounds normal. No respiratory distress. She has no wheezes.  GI: Soft. Bowel sounds are normal. She exhibits no distension. There is no tenderness.  Musculoskeletal: She exhibits no edema.  Able to move all extremities   Lymphadenopathy:    She has no cervical adenopathy.  Neurological: She is alert.  Skin: Skin is warm and dry. She is not diaphoretic.  Psychiatric: She has a normal mood and affect.       ASSESSMENT/ PLAN:  1. Hypothyroidism:  will continue synthroid 25 mcg daily  2. Hypertension: will continue norvasc 5 mg daily   3. Vit d def: her level is 90.67; will stop vit d at this time and will monitor  4. Constipation: will continue senna s daily  5.  Dementia: no significant change in her status; she is presently not on medications; will not make changes will monitor  6. COPD: no change in status; is presently not on medications; will not make changes will monitor    Time spent with patient  45  minutes >50% time spent counseling; reviewing medical record; tests; labs; and developing future plan of care   Synthia Innocenteborah Green NP [facility]  Contact [phone] Monday through Friday 8am- 5pm  After hours call [phone]

## 2015-05-24 ENCOUNTER — Non-Acute Institutional Stay (SKILLED_NURSING_FACILITY): Payer: Medicare Other | Admitting: Nurse Practitioner

## 2015-05-24 DIAGNOSIS — E034 Atrophy of thyroid (acquired): Secondary | ICD-10-CM

## 2015-05-24 DIAGNOSIS — B9789 Other viral agents as the cause of diseases classified elsewhere: Secondary | ICD-10-CM

## 2015-05-24 DIAGNOSIS — E038 Other specified hypothyroidism: Secondary | ICD-10-CM | POA: Diagnosis not present

## 2015-05-24 DIAGNOSIS — F0390 Unspecified dementia without behavioral disturbance: Secondary | ICD-10-CM

## 2015-05-24 DIAGNOSIS — J029 Acute pharyngitis, unspecified: Secondary | ICD-10-CM

## 2015-05-24 DIAGNOSIS — K5900 Constipation, unspecified: Secondary | ICD-10-CM | POA: Diagnosis not present

## 2015-05-24 DIAGNOSIS — M810 Age-related osteoporosis without current pathological fracture: Secondary | ICD-10-CM

## 2015-05-24 DIAGNOSIS — J028 Acute pharyngitis due to other specified organisms: Secondary | ICD-10-CM

## 2015-05-24 DIAGNOSIS — I10 Essential (primary) hypertension: Secondary | ICD-10-CM

## 2015-05-24 NOTE — Progress Notes (Signed)
Patient ID: Shannon Gomez, female   DOB: 11/05/1913, 82 y.o.   MRN: [id number]    Nursing Home Location:  [facility]   Place of Service: SNF (31)  PCP: Shannon Grout, MD  Allergies  Allergen Reactions  . Sulfonamide Derivatives     Chief Complaint  Patient presents with  . Medical Management of Chronic Issues    HPI:  Patient is a 82 y.o. female seen today at [facility] for medical management of her chronic issues.  She has a past medical history of HTN, GERD, hypothyroidism, chronic pain and dementia.  Pt has been doing well in the last month. Nursing noted sore throat yesterday but no complaints of this today. Pt reports she is doing fine. Does not have pain. Bowel moving without issues. Staff notes occasional cough.   Review of Systems: Limited due patient's advanced dementia, provided by staff and pt.   Review of Systems  Constitutional: Negative for activity change, appetite change and fatigue.  HENT: Negative for congestion.   Eyes: Negative.   Respiratory: Negative for cough and shortness of breath.   Cardiovascular: Negative for chest pain and leg swelling.  Gastrointestinal: Negative for abdominal pain, diarrhea and constipation.  Genitourinary: Negative for dysuria.  Musculoskeletal: Negative for myalgias and arthralgias.  Skin: Negative for color change and wound.  Neurological: Negative for dizziness.  Psychiatric/Behavioral: Positive for confusion. Negative for behavioral problems and agitation.    Past Medical History  Diagnosis Date  . Thyroid disease   . Hypertension   . GERD (gastroesophageal reflux disease)   . Vitamin D deficiency   . Dementia   . Chronic pain   . Chronic kidney disease   . HCAP (healthcare-associated pneumonia) 05/17/2013  . GASTRIC ULCER 01/01/2008    Qualifier: Diagnosis of  By: Andrey Campanile MD, Raliegh Ip    . DIVERTICULITIS, HX OF 01/01/2008    Qualifier: Diagnosis of  By: Andrey Campanile MD, Raliegh Ip      . COLONIC POLYPS, HX OF 01/01/2008    Qualifier: Diagnosis of  By: Andrey Campanile MD, LauraLee     No past surgical history on file. Social History:   has no tobacco, alcohol, and drug history on file.  No family history on file.  Medications: Patient's Medications  New Prescriptions   No medications on file  Previous Medications   AMLODIPINE (NORVASC) 5 MG TABLET    Take 5 mg by mouth daily.   ARTIFICIAL SALIVA (BIOTENE MOISTURIZING MOUTH) SOLN    Use as directed 10 sprays in the mouth or throat 4 (four) times daily as needed.   CALCIUM CARBONATE (OS-CAL) 600 MG TABS TABLET    Take 600 mg by mouth 2 (two) times daily with a meal.   LEVOTHYROXINE (SYNTHROID, LEVOTHROID) 25 MCG TABLET    Take 25 mcg by mouth daily before breakfast.   SENNOSIDES-DOCUSATE SODIUM (SENOKOT-S) 8.6-50 MG TABLET    Take 1 tablet by mouth daily.    TRAMADOL (ULTRAM) 50 MG TABLET    Take 50-100 mg by mouth every 6 (six) hours as needed.  Modified Medications   No medications on file  Discontinued Medications   No medications on file     Physical Exam: Filed Vitals:   05/24/15 1638  BP: 158/63  Pulse: 64  Temp: 97.2 F (36.2 C)  Resp: 20  Weight: 126 lb 11.2 oz (57.471 kg)    Physical Exam  Constitutional: No distress.  Frail elderly female  HENT:  Head: Normocephalic  and atraumatic.  Right Ear: External ear normal.  Left Ear: External ear normal.  Mouth/Throat: Oropharynx is clear and moist. No oropharyngeal exudate.  Eyes: Conjunctivae are normal. Pupils are equal, round, and reactive to light.  Neck: Normal range of motion. Neck supple.  Cardiovascular: Normal rate, regular rhythm and normal heart sounds.   No murmur heard. Pulmonary/Chest: Effort normal and breath sounds normal. She has no wheezes.  Abdominal: Soft. Bowel sounds are normal. She exhibits no distension. There is no tenderness.  Musculoskeletal: Normal range of motion. She exhibits no edema or tenderness.  Neurological: She is  alert.  Skin: Skin is warm and dry. She is not diaphoretic.    Labs reviewed: Basic Metabolic Panel:  Recent Labs  14/78/29 02/02/15  NA 135* 135*  K 4.4 4.0  BUN 33* 25*  CREATININE 1.0 1.0   Liver Function Tests: No results for input(s): AST, ALT, ALKPHOS, BILITOT, PROT, ALBUMIN in the last 8760 hours. No results for input(s): LIPASE, AMYLASE in the last 8760 hours. No results for input(s): AMMONIA in the last 8760 hours. CBC:  Recent Labs  06/01/14 08/04/14 02/02/15  WBC 13.3 10.9 11.1  HGB 14.5 13.6 14.4  HCT 44 40 44  PLT 220 234 236   TSH:  Recent Labs  08/04/14 02/02/15  TSH 3.98 2.69   A1C: No results found for: HGBA1C Lipid Panel: No results for input(s): CHOL, HDL, LDLCALC, TRIG, CHOLHDL, LDLDIRECT in the last 8760 hours.  Assessment/Plan 1. Essential hypertension, benign -stable to cont norvasc 5 mg daily  2. Dementia without behavioral disturbance Advanced dementia, no acute cognitive or functional decline.   3. Constipation, unspecified constipation type Well controlled on current regimen.   4. Osteoporosis - conts on calcium supplement   5. Hypothyroidism due to acquired atrophy of thyroid -TSH stable in September, conts on synthroid 25 mcg  6. Sore throat (viral) Noted yesterday but without complaints today, staff to monitor, may use facility protocol as needed medication if needed    Shannon Gomez K. Biagio Borg  [facility] [phone] 8 am - 5 pm) [phone] (after hours)

## 2015-05-26 ENCOUNTER — Non-Acute Institutional Stay (SKILLED_NURSING_FACILITY): Payer: Medicare Other | Admitting: Internal Medicine

## 2015-05-26 DIAGNOSIS — F0390 Unspecified dementia without behavioral disturbance: Secondary | ICD-10-CM | POA: Diagnosis not present

## 2015-05-26 DIAGNOSIS — R05 Cough: Secondary | ICD-10-CM

## 2015-05-26 DIAGNOSIS — J020 Streptococcal pharyngitis: Secondary | ICD-10-CM | POA: Insufficient documentation

## 2015-05-26 DIAGNOSIS — J029 Acute pharyngitis, unspecified: Secondary | ICD-10-CM | POA: Insufficient documentation

## 2015-05-26 DIAGNOSIS — R059 Cough, unspecified: Secondary | ICD-10-CM | POA: Insufficient documentation

## 2015-05-26 NOTE — Progress Notes (Signed)
Patient ID: Shannon Gomez, female   DOB: 1913-07-06, 82 y.o.   MRN: [id number]   Location:  [facility] and rehabilitation Provider: Oneal Grout, MD  Code Status: DNR Goals of care: Advanced Directive information No flowsheet data found.   Chief Complaint  Patient presents with  . Acute Visit    Sore throat, Cough     HPI:  Pt is a 82 y.o. female seen today at [facility] and rehabilitation for acute complains of sore throat and non-productive cough per facility staff. Patient had teeth extraction 05/25/2015. Facility staff reports no chills, fever, mental status changes. Patient history limited due to cognitive impairment secondary to presence of dementia.               ROS  Unable to assess due to presence of dementia.  Past Medical History  Diagnosis Date  . Thyroid disease   . Hypertension   . GERD (gastroesophageal reflux disease)   . Vitamin D deficiency   . Dementia   . Chronic pain   . Chronic kidney disease   . HCAP (healthcare-associated pneumonia) 05/17/2013  . GASTRIC ULCER 01/01/2008    Qualifier: Diagnosis of  By: Andrey Campanile MD, Raliegh Ip    . DIVERTICULITIS, HX OF 01/01/2008    Qualifier: Diagnosis of  By: Andrey Campanile MD, Raliegh Ip    . COLONIC POLYPS, HX OF 01/01/2008    Qualifier: Diagnosis of  By: Andrey Campanile MD, LauraLee     No past surgical history on file.  Allergies  Allergen Reactions  . Sulfonamide Derivatives       Medication List       This list is accurate as of: 05/26/15  3:10 PM.  Always use your most recent med list.               amLODipine 5 MG tablet  Commonly known as:  NORVASC  Take 5 mg by mouth daily.     BIOTENE MOISTURIZING MOUTH Soln  Use as directed 10 sprays in the mouth or throat 4 (four) times daily as needed.     calcium carbonate 600 MG Tabs tablet  Commonly known as:  OS-CAL  Take 600 mg by mouth 2 (two) times daily with a meal.     levothyroxine 25 MCG tablet  Commonly known as:  SYNTHROID,  LEVOTHROID  Take 25 mcg by mouth daily before breakfast.     sennosides-docusate sodium 8.6-50 MG tablet  Commonly known as:  SENOKOT-S  Take 1 tablet by mouth daily.     traMADol 50 MG tablet  Commonly known as:  ULTRAM  Take 50-100 mg by mouth every 6 (six) hours as needed.        Immunization History  Administered Date(s) Administered  . Influenza-Unspecified 02/08/2014, 02/16/2015  . PPD Test 01/17/2014  . Pneumococcal-Unspecified 11/08/2009   Pertinent  Health Maintenance Due  Topic Date Due  . PNA vac Low Risk Adult (2 of 2 - PCV13) 11/09/2010  . INFLUENZA VACCINE  12/05/2015  . DEXA SCAN  Completed   No flowsheet data found.  Filed Vitals:   05/26/15 1426  BP: 135/79  Pulse: 79  Temp: 96.7 F (35.9 C)  Resp: 18  Weight: 126 lb 11.2 oz (57.471 kg)  SpO2: 94%   Body mass index is 24.74 kg/(m^2). Physical Exam  Constitutional:  Frail elderly in no acute distress.   HENT:  Head: Normocephalic.  Unable to examine oropharynx patient does not follow commands.   Eyes: Pupils are equal,  round, and reactive to light. Right eye exhibits no discharge. Left eye exhibits no discharge. No scleral icterus.  Neck: Normal range of motion.  Tender palpation   Cardiovascular: Normal rate and regular rhythm.   Pulmonary/Chest: Effort normal.  Diminished breath sounds.   Abdominal: Soft. Bowel sounds are normal.  Musculoskeletal: Normal range of motion. She exhibits no edema.  Lymphadenopathy:    She has no cervical adenopathy.  Neurological:  Alert and oriented to self.   Skin: Skin is warm and dry.  Psychiatric: She has a normal mood and affect.    Labs reviewed:  Recent Labs  08/04/14 02/02/15  NA 135* 135*  K 4.4 4.0  BUN 33* 25*  CREATININE 1.0 1.0   No results for input(s): AST, ALT, ALKPHOS, BILITOT, PROT, ALBUMIN in the last 8760 hours.  Recent Labs  06/01/14 08/04/14 02/02/15  WBC 13.3 10.9 11.1  HGB 14.5 13.6 14.4  HCT 44 40 44  PLT 220 234 236     Lab Results  Component Value Date   TSH 2.69 02/02/2015    Assessment/Plan 1.  sore throat Afebrile.Unable to assess oral pharynx but tender to palpation. Will initiate Tylenol 325 mg Tablet two tabs every 8 Hrs X 48 hours then change to PRN. Facility to monitor V/s once daily X 3 days.   2. Cough Non-productive cough with diminished breath sounds.Will order portable CXR 2 views. Get CBC with diff and CMP to evaluate further. Continue with Guaifenesin PRN.  3. Dementia  No new behavioral issues reported. Continue to monitor.       Family/ staff Communication: Reviewed plan with facility staff  Labs/tests ordered:   CBC/diff, CMP   Oneal Grout, MD  [facility] [phone] (Monday-Friday 8 am - 5 pm) [phone] (afterhours)

## 2015-05-27 DIAGNOSIS — R093 Abnormal sputum: Secondary | ICD-10-CM | POA: Diagnosis not present

## 2015-05-29 DIAGNOSIS — D508 Other iron deficiency anemias: Secondary | ICD-10-CM | POA: Diagnosis not present

## 2015-05-29 LAB — BASIC METABOLIC PANEL
BUN: 24 mg/dL — AB (ref 4–21)
Creatinine: 0.9 mg/dL (ref 0.5–1.1)
GLUCOSE: 83 mg/dL
Potassium: 4 mmol/L (ref 3.4–5.3)
Sodium: 141 mmol/L (ref 137–147)

## 2015-05-29 LAB — HEPATIC FUNCTION PANEL
ALT: 9 U/L (ref 7–35)
AST: 20 U/L (ref 13–35)
Alkaline Phosphatase: 62 U/L (ref 25–125)
BILIRUBIN, TOTAL: 0.6 mg/dL

## 2015-05-29 LAB — CBC AND DIFFERENTIAL
HCT: 46 % (ref 36–46)
HEMOGLOBIN: 15.2 g/dL (ref 12.0–16.0)
Platelets: 298 10*3/uL (ref 150–399)
WBC: 9.7 10*3/mL

## 2015-06-21 ENCOUNTER — Encounter: Payer: Self-pay | Admitting: Nurse Practitioner

## 2015-06-21 ENCOUNTER — Non-Acute Institutional Stay (SKILLED_NURSING_FACILITY): Payer: Medicare Other | Admitting: Nurse Practitioner

## 2015-06-21 DIAGNOSIS — E034 Atrophy of thyroid (acquired): Secondary | ICD-10-CM | POA: Diagnosis not present

## 2015-06-21 DIAGNOSIS — K5900 Constipation, unspecified: Secondary | ICD-10-CM | POA: Diagnosis not present

## 2015-06-21 DIAGNOSIS — F0390 Unspecified dementia without behavioral disturbance: Secondary | ICD-10-CM | POA: Diagnosis not present

## 2015-06-21 DIAGNOSIS — M1990 Unspecified osteoarthritis, unspecified site: Secondary | ICD-10-CM | POA: Diagnosis not present

## 2015-06-21 DIAGNOSIS — M810 Age-related osteoporosis without current pathological fracture: Secondary | ICD-10-CM | POA: Diagnosis not present

## 2015-06-21 DIAGNOSIS — E038 Other specified hypothyroidism: Secondary | ICD-10-CM

## 2015-06-21 DIAGNOSIS — I10 Essential (primary) hypertension: Secondary | ICD-10-CM | POA: Diagnosis not present

## 2015-06-21 NOTE — Progress Notes (Signed)
Patient ID: Shannon Gomez, female   DOB: [birth year], 82 y.o.   MRN: [id number]    Nursing Home Location:  [facility]   Place of Service: SNF (31)  PCP: Oneal Grout, MD  Allergies  Allergen Reactions  . Sulfonamide Derivatives     Chief Complaint  Patient presents with  . Medical Management of Chronic Issues    Routine Visit    HPI:  Patient is a 82 y.o. female seen today at [facility] and Rehab for medical management of her chronic issues.  She has a past medical history of HTN, GERD, hypothyroidism, chronic pain and dementia.  Pt has been doing well in the last month. Pt had dental work done in January. Pt reports she is doing well. Does not have complaints. Limited HPI and ROS due to dementia. Staff without acute concerns.  Review of Systems: Limited due patient's advanced dementia, provided by staff and pt.   Review of Systems  Constitutional: Negative for activity change, appetite change and fatigue.  HENT: Negative for congestion.   Eyes: Negative.   Respiratory: Negative for cough and shortness of breath.   Cardiovascular: Negative for chest pain and leg swelling.  Gastrointestinal: Negative for abdominal pain, diarrhea and constipation.  Genitourinary: Negative for dysuria.  Musculoskeletal: Negative for myalgias and arthralgias.  Skin: Negative for color change and wound.  Neurological: Negative for dizziness.  Psychiatric/Behavioral: Positive for confusion. Negative for behavioral problems and agitation.    Past Medical History  Diagnosis Date  . Thyroid disease   . Hypertension   . GERD (gastroesophageal reflux disease)   . Vitamin D deficiency   . Dementia   . Chronic pain   . Chronic kidney disease   . HCAP (healthcare-associated pneumonia) 05/17/2013  . GASTRIC ULCER 01/01/2008    Qualifier: Diagnosis of  By: Andrey Campanile MD, Raliegh Ip    . DIVERTICULITIS, HX OF 01/01/2008    Qualifier: Diagnosis of  By: Andrey Campanile MD, Raliegh Ip      . COLONIC POLYPS, HX OF 01/01/2008    Qualifier: Diagnosis of  By: Andrey Campanile MD, Raliegh Ip     History reviewed. No pertinent past surgical history. Social History:   has no tobacco, alcohol, and drug history on file.  History reviewed. No pertinent family history.  Medications: Patient's Medications  New Prescriptions   No medications on file  Previous Medications   ACETAMINOPHEN 325 MG CAPS    Take 650 mg by mouth every 6 (six) hours as needed.   AMLODIPINE (NORVASC) 5 MG TABLET    Take 5 mg by mouth daily.   ARTIFICIAL SALIVA (BIOTENE MOISTURIZING MOUTH) SOLN    Use as directed 10 sprays in the mouth or throat 4 (four) times daily as needed.   CALCIUM CARBONATE (OS-CAL) 600 MG TABS TABLET    Take 600 mg by mouth 2 (two) times daily with a meal.   LEVOTHYROXINE (SYNTHROID, LEVOTHROID) 25 MCG TABLET    Take 25 mcg by mouth daily before breakfast.   SENNOSIDES-DOCUSATE SODIUM (SENOKOT-S) 8.6-50 MG TABLET    Take 1 tablet by mouth daily.    TRAMADOL (ULTRAM) 50 MG TABLET    Take 50-100 mg by mouth every 6 (six) hours as needed.  Modified Medications   No medications on file  Discontinued Medications   No medications on file     Physical Exam: Filed Vitals:   06/21/15 1037  BP: 142/70  Pulse: 53  Temp: 98.7 F (37.1 C)  TempSrc: Oral  Resp: 16  Height: 5' (1.524 m)  Weight: 120 lb 14.4 oz (54.84 kg)    Physical Exam  Constitutional: No distress.  Frail elderly female  HENT:  Head: Normocephalic and atraumatic.  Mouth/Throat: Oropharynx is clear and moist. No oropharyngeal exudate.  Eyes: Conjunctivae are normal. Pupils are equal, round, and reactive to light.  Neck: Normal range of motion. Neck supple.  Cardiovascular: Normal rate, regular rhythm and normal heart sounds.   No murmur heard. Pulmonary/Chest: Effort normal and breath sounds normal.  Abdominal: Soft. Bowel sounds are normal. She exhibits no distension. There is no tenderness.  Musculoskeletal: Normal range  of motion. She exhibits no edema or tenderness.  Neurological: She is alert.  Skin: Skin is warm and dry. She is not diaphoretic.    Labs reviewed: Basic Metabolic Panel:  Recent Labs  16/10/96 02/02/15 05/29/15  NA 135* 135* 141  K 4.4 4.0 4.0  BUN 33* 25* 24*  CREATININE 1.0 1.0 0.9   Liver Function Tests:  Recent Labs  05/29/15  AST 20  ALT 9  ALKPHOS 62   No results for input(s): LIPASE, AMYLASE in the last 8760 hours. No results for input(s): AMMONIA in the last 8760 hours. CBC:  Recent Labs  08/04/14 02/02/15 05/29/15  WBC 10.9 11.1 9.7  HGB 13.6 14.4 15.2  HCT 40 44 46  PLT 234 236 298   TSH:  Recent Labs  08/04/14 02/02/15  TSH 3.98 2.69   A1C: No results found for: HGBA1C Lipid Panel: No results for input(s): CHOL, HDL, LDLCALC, TRIG, CHOLHDL, LDLDIRECT in the last 8760 hours.  Assessment/Plan 1. Constipation, unspecified constipation type - well controlled on senokot s daily  2. Essential hypertension, benign Blood pressure stable on norvasc 5 mg daily  3. Hypothyroidism due to acquired atrophy of thyroid TSH stable in September 2016 at 2.69, will cont synthroid 25 mcg  4. Dementia without behavioral disturbance -stable, no acute changes in cognitive or functional status. Requires staff for assistance for all ADLs  5. Osteoarthritis, unspecified osteoarthritis type, unspecified site Denies pain. conts on ultram and acetaminophen PRN   6. Senile osteoporosis -stable, conts on os-cal BID.    Janene Harvey. Biagio Borg  [facility] [phone] 8 am - 5 pm) [phone] (after hours)

## 2015-06-22 ENCOUNTER — Non-Acute Institutional Stay (SKILLED_NURSING_FACILITY): Payer: Medicare Other | Admitting: Family

## 2015-06-22 DIAGNOSIS — F0390 Unspecified dementia without behavioral disturbance: Secondary | ICD-10-CM | POA: Diagnosis not present

## 2015-06-22 DIAGNOSIS — R001 Bradycardia, unspecified: Secondary | ICD-10-CM | POA: Diagnosis not present

## 2015-06-22 DIAGNOSIS — E039 Hypothyroidism, unspecified: Secondary | ICD-10-CM

## 2015-06-22 DIAGNOSIS — I10 Essential (primary) hypertension: Secondary | ICD-10-CM | POA: Diagnosis not present

## 2015-06-22 NOTE — Progress Notes (Signed)
Patient ID: Shannon Gomez, female   DOB: 1914-03-13, 82 y.o.   MRN: [id number]  Location: [facility]  Provider:   Oneal Grout, MD  Code Status:  DNR Goals of care: Advanced Directive information Advanced Directives 06/21/2015  Does patient have an advance directive? -  Type of Estate agent of [place];Out of facility DNR (pink MOST or yellow form)  Does patient want to make changes to advanced directive? No - Patient declined  Copy of advanced directive(s) in chart? Yes     Chief Complaint  Patient presents with  . Acute Visit    Low Heart Rate in 50's     HPI:  Pt is a 82 y.o. female seen today at [facility] and Rehabilitation to evaluate low Heart Rate. She has a past medical History of HTN, CKD, GERD, Dementia, Hypothyroidism among others. She is seen in her room today. Shew denies no acute issues though history limited due to her dementia. Facility Nurse reports that patient's Heart rate has been consistently running in low 50's for the past three nights. She is currently on Amlodipine in the morning SBP in the 150's.She has been afebrile.      Review of Systems  Constitutional: Negative for fever, chills and diaphoresis.  HENT: Negative.   Respiratory: Negative.   Cardiovascular: Negative for chest pain, palpitations, orthopnea and leg swelling.  Gastrointestinal: Negative.   Genitourinary: Negative.   Musculoskeletal: Negative.   Skin: Negative.   Neurological: Negative.   Endo/Heme/Allergies: Negative.   Psychiatric/Behavioral: Negative.     Past Medical History  Diagnosis Date  . Thyroid disease   . Hypertension   . GERD (gastroesophageal reflux disease)   . Vitamin D deficiency   . Dementia   . Chronic pain   . Chronic kidney disease   . HCAP (healthcare-associated pneumonia) 05/17/2013  . GASTRIC ULCER 01/01/2008    Qualifier: Diagnosis of  By: Andrey Campanile MD, Raliegh Ip    . DIVERTICULITIS, HX OF  01/01/2008    Qualifier: Diagnosis of  By: Andrey Campanile MD, Raliegh Ip    . COLONIC POLYPS, HX OF 01/01/2008    Qualifier: Diagnosis of  By: Andrey Campanile MD, LauraLee     No past surgical history on file.  Allergies  Allergen Reactions  . Sulfonamide Derivatives       Medication List       This list is accurate as of: 06/22/15  4:04 PM.  Always use your most recent med list.               Acetaminophen 325 MG Caps  Take 650 mg by mouth every 6 (six) hours as needed.     amLODipine 5 MG tablet  Commonly known as:  NORVASC  Take 5 mg by mouth daily.     BIOTENE MOISTURIZING MOUTH Soln  Use as directed 10 sprays in the mouth or throat 4 (four) times daily as needed.     calcium carbonate 600 MG Tabs tablet  Commonly known as:  OS-CAL  Take 600 mg by mouth 2 (two) times daily with a meal.     levothyroxine 25 MCG tablet  Commonly known as:  SYNTHROID, LEVOTHROID  Take 25 mcg by mouth daily before breakfast.     sennosides-docusate sodium 8.6-50 MG tablet  Commonly known as:  SENOKOT-S  Take 1 tablet by mouth daily.     traMADol 50 MG tablet  Commonly known as:  ULTRAM  Take 50-100 mg by mouth every 6 (  six) hours as needed.        Immunization History  Administered Date(s) Administered  . Influenza-Unspecified 02/08/2014, 03/03/2015  . PPD Test 01/17/2014, 01/10/2015  . Pneumococcal-Unspecified 11/08/2009   Pertinent  Health Maintenance Due  Topic Date Due  . PNA vac Low Risk Adult (2 of 2 - PCV13) 07/01/2016 (Originally 11/09/2010)  . INFLUENZA VACCINE  12/05/2015  . DEXA SCAN  Completed   No flowsheet data found.  Filed Vitals:   06/22/15 1534  BP: 157/67  Pulse: 50  Temp: 97.7 F (36.5 C)  Resp: 18  Weight: 120 lb 9.6 oz (54.704 kg)  SpO2: 98%   Body mass index is 23.55 kg/(m^2). Physical Exam  Constitutional: She appears well-developed.  Elderly in no acute distress.   HENT:  Head: Normocephalic.  Eyes: Pupils are equal, round, and reactive to light. Right  eye exhibits no discharge. Left eye exhibits no discharge. No scleral icterus.  Neck: Normal range of motion. No JVD present. No tracheal deviation present. No thyromegaly present.  Cardiovascular: Normal rate, regular rhythm and intact distal pulses.  Exam reveals no gallop and no friction rub.   No murmur heard. Pulmonary/Chest: Effort normal and breath sounds normal. No respiratory distress. She has no wheezes. She has no rales.  Abdominal: Soft. Bowel sounds are normal. She exhibits no distension and no mass. There is no tenderness. There is no guarding.  Lymphadenopathy:    She has no cervical adenopathy.  Neurological: She is alert.  Skin: Skin is warm. No rash noted. No erythema. No pallor.  Psychiatric: She has a normal mood and affect.    Labs reviewed:  Recent Labs  08/04/14 02/02/15 05/29/15  NA 135* 135* 141  K 4.4 4.0 4.0  BUN 33* 25* 24*  CREATININE 1.0 1.0 0.9    Recent Labs  05/29/15  AST 20  ALT 9  ALKPHOS 62    Recent Labs  08/04/14 02/02/15 05/29/15  WBC 10.9 11.1 9.7  HGB 13.6 14.4 15.2  HCT 40 44 46  PLT 234 236 298   Lab Results  Component Value Date   TSH 2.69 02/02/2015   No results found for: HGBA1C No results found for: CHOL, HDL, LDLCALC, LDLDIRECT, TRIG, CHOLHDL  Significant Diagnostic Results in last 30 days:  No results found.  Assessment/Plan 1. Bradycardia HR in the 50's. Asymptomatic.Obtain EKG, CBC/diff, CMP   2. Hypothyroidism, unspecified hypothyroidism type Currently on Levothyroxine 25 mcg Tablet. Recheck TSH level  3. Dementia  No new behavioral issues   Family/ staff Communication: Reviewed Plan of Care with Facility Nurse   Labs/tests ordered: EKG, CBC/diff, CMP and TSH level

## 2015-06-23 DIAGNOSIS — E038 Other specified hypothyroidism: Secondary | ICD-10-CM | POA: Diagnosis not present

## 2015-06-23 LAB — BASIC METABOLIC PANEL
BUN: 19 mg/dL (ref 4–21)
Creatinine: 1 mg/dL (ref 0.5–1.1)
GLUCOSE: 95 mg/dL
Potassium: 4.2 mmol/L (ref 3.4–5.3)
SODIUM: 140 mmol/L (ref 137–147)

## 2015-06-23 LAB — HEPATIC FUNCTION PANEL
ALT: 7 U/L (ref 7–35)
AST: 18 U/L (ref 13–35)
Alkaline Phosphatase: 67 U/L (ref 25–125)
BILIRUBIN, TOTAL: 0.8 mg/dL

## 2015-06-23 LAB — CBC AND DIFFERENTIAL
HEMATOCRIT: 45 % (ref 36–46)
HEMOGLOBIN: 14.8 g/dL (ref 12.0–16.0)
PLATELETS: 237 10*3/uL (ref 150–399)
WBC: 10.3 10*3/mL

## 2015-06-23 LAB — TSH: TSH: 2.11 u[IU]/mL (ref 0.41–5.90)

## 2015-07-11 ENCOUNTER — Non-Acute Institutional Stay (SKILLED_NURSING_FACILITY): Payer: Medicare Other | Admitting: Family

## 2015-07-11 DIAGNOSIS — L219 Seborrheic dermatitis, unspecified: Secondary | ICD-10-CM | POA: Insufficient documentation

## 2015-07-11 DIAGNOSIS — E039 Hypothyroidism, unspecified: Secondary | ICD-10-CM | POA: Diagnosis not present

## 2015-07-11 DIAGNOSIS — E673 Hypervitaminosis D: Secondary | ICD-10-CM | POA: Diagnosis not present

## 2015-07-11 DIAGNOSIS — L218 Other seborrheic dermatitis: Secondary | ICD-10-CM | POA: Diagnosis not present

## 2015-07-11 DIAGNOSIS — E58 Dietary calcium deficiency: Secondary | ICD-10-CM | POA: Diagnosis not present

## 2015-07-11 DIAGNOSIS — I158 Other secondary hypertension: Secondary | ICD-10-CM | POA: Diagnosis not present

## 2015-07-11 LAB — BASIC METABOLIC PANEL
BUN: 32 mg/dL — AB (ref 4–21)
CREATININE: 0.9 mg/dL (ref 0.5–1.1)
Glucose: 91 mg/dL
Potassium: 4.2 mmol/L (ref 3.4–5.3)
Sodium: 139 mmol/L (ref 137–147)

## 2015-07-11 LAB — TSH: TSH: 2.5 u[IU]/mL (ref 0.41–5.90)

## 2015-07-11 NOTE — Progress Notes (Signed)
Patient ID: Shannon Gomez, female   DOB: 1914/02/24, 82 y.o.   MRN: [id number]  Location:  [facility]   Place of Service:  SNF [phone]) Provider:  Oneal Grout, MD  Patient Care Team: Oneal Grout, MD as PCP - General (Internal Medicine) Sharee Holster, NP as Nurse Practitioner (Nurse Practitioner)  Extended Emergency Contact Information Primary Emergency Contact: Neese,Bill Address: [address]          [place], [place] [address] [place] Home Phone: [phone] Relation: Nephew  Code Status:  DNR  Goals of care: Advanced Directive information Advanced Directives 06/21/2015  Does patient have an advance directive? -  Type of Estate agent of [place];Out of facility DNR (pink MOST or yellow form)  Does patient want to make changes to advanced directive? No - Patient declined  Copy of advanced directive(s) in chart? Yes     Chief Complaint  Patient presents with  . Acute Visit    HPI:  Pt is a 82 y.o. female seen today [facility] for an acute visit for evaluation of large itchy scaly patch on top of the head. She has a medical history of HTN, GERD, Hypothyroidism, Dementia amongst others. She is seen in her room today in bed watching TV. Facility staff reports patient has been scratching her head. Patient's history limited due to presence of dementia.    Past Medical History  Diagnosis Date  . Thyroid disease   . Hypertension   . GERD (gastroesophageal reflux disease)   . Vitamin D deficiency   . Dementia   . Chronic pain   . Chronic kidney disease   . HCAP (healthcare-associated pneumonia) 05/17/2013  . GASTRIC ULCER 01/01/2008    Qualifier: Diagnosis of  By: Andrey Campanile MD, Raliegh Ip    . DIVERTICULITIS, HX OF 01/01/2008    Qualifier: Diagnosis of  By: Andrey Campanile MD, Raliegh Ip    . COLONIC POLYPS, HX OF 01/01/2008    Qualifier: Diagnosis of  By: Andrey Campanile MD, LauraLee     No past surgical history on  file.  Allergies  Allergen Reactions  . Sulfonamide Derivatives       Medication List       This list is accurate as of: 07/11/15  5:32 PM.  Always use your most recent med list.               Acetaminophen 325 MG Caps  Take 650 mg by mouth every 6 (six) hours as needed.     amLODipine 5 MG tablet  Commonly known as:  NORVASC  Take 5 mg by mouth daily.     BIOTENE MOISTURIZING MOUTH Soln  Use as directed 10 sprays in the mouth or throat 4 (four) times daily as needed.     calcium carbonate 600 MG Tabs tablet  Commonly known as:  OS-CAL  Take 600 mg by mouth 2 (two) times daily with a meal.     levothyroxine 25 MCG tablet  Commonly known as:  SYNTHROID, LEVOTHROID  Take 25 mcg by mouth daily before breakfast.     sennosides-docusate sodium 8.6-50 MG tablet  Commonly known as:  SENOKOT-S  Take 1 tablet by mouth daily.     traMADol 50 MG tablet  Commonly known as:  ULTRAM  Take 50-100 mg by mouth every 6 (six) hours as needed.        Review of Systems  Unable to perform ROS: Dementia    Immunization  History  Administered Date(s) Administered  . Influenza-Unspecified 02/08/2014, 03/03/2015  . PPD Test 01/17/2014, 01/10/2015  . Pneumococcal-Unspecified 11/08/2009   Pertinent  Health Maintenance Due  Topic Date Due  . PNA vac Low Risk Adult (2 of 2 - PCV13) 07/01/2016 (Originally 11/09/2010)  . INFLUENZA VACCINE  12/05/2015  . DEXA SCAN  Completed   No flowsheet data found. Functional Status Survey:    Filed Vitals:   07/11/15 1718  BP: 150/61  Pulse: 52  Temp: 97.6 F (36.4 C)  Resp: 18  Weight: 121 lb 11.2 oz (55.203 kg)  SpO2: 96%   Body mass index is 23.77 kg/(m^2). Physical Exam  Constitutional: She appears well-developed and well-nourished.  Elderly in no acute distress  HENT:  Head: Atraumatic.  Large scaly patch on crown of the head.   Eyes: Conjunctivae and EOM are normal. Pupils are equal, round, and reactive to light. Right eye  exhibits no discharge. Left eye exhibits no discharge. No scleral icterus.  Neck: Neck supple. No JVD present.  Cardiovascular: Normal rate, regular rhythm, normal heart sounds and intact distal pulses.  Exam reveals no gallop and no friction rub.   No murmur heard. Pulmonary/Chest: Effort normal and breath sounds normal. No respiratory distress. She has no wheezes. She has no rales.  Lymphadenopathy:    She has no cervical adenopathy.  Neurological: She is alert.  Skin: Skin is warm and dry. No rash noted. No erythema. No pallor.  Large scaly patch on crown of the head noted.     Labs reviewed:  Recent Labs  08/04/14 02/02/15 05/29/15  NA 135* 135* 141  K 4.4 4.0 4.0  BUN 33* 25* 24*  CREATININE 1.0 1.0 0.9    Recent Labs  05/29/15  AST 20  ALT 9  ALKPHOS 62    Recent Labs  08/04/14 02/02/15 05/29/15  WBC 10.9 11.1 9.7  HGB 13.6 14.4 15.2  HCT 40 44 46  PLT 234 236 298   Lab Results  Component Value Date   TSH 2.69 02/02/2015   No results found for: HGBA1C No results found for: CHOL, HDL, LDLCALC, LDLDIRECT, TRIG, CHOLHDL  Significant Diagnostic Results in last 30 days:  No results found.  Assessment/Plan Seborrheic dermatitis of scalp Large scaly patch on crown of the head consistent with seborrheic. Will order Ketoconazole topical 2 % shampoo apply twice per week on shower/bath days.Continue to monitor.     Family/ staff Communication: Reviewed plan with [facility] Nurse Supervisor.   Labs/tests ordered:  None

## 2015-07-13 ENCOUNTER — Encounter: Payer: Self-pay | Admitting: Internal Medicine

## 2015-07-13 NOTE — Progress Notes (Deleted)
Patient ID: Shannon Gomez, female   DOB: Nov 28, 1913, 82 y.o.   MRN: [id number]   Location:  [facility] and rehabilitation Provider: Oneal Grout, MD  Code Status: DNR Goals of care: Advanced Directive information Advanced Directives 06/21/2015  Does patient have an advance directive? -  Type of Estate agent of [place];Out of facility DNR (pink MOST or yellow form)  Does patient want to make changes to advanced directive? No - Patient declined  Copy of advanced directive(s) in chart? Yes     Chief Complaint  Patient presents with  . Medical Management of Chronic Issues    Routine Visit    HPI:  Pt is a 82 y.o. female seen today at [facility] and rehabilitation for acute complains of sore throat and non-productive cough per facility staff. Patient had teeth extraction 05/25/2015. Facility staff reports no chills, fever, mental status changes. Patient history limited due to cognitive impairment secondary to presence of dementia.               ROS  Unable to assess due to presence of dementia.  Past Medical History  Diagnosis Date  . Thyroid disease   . Hypertension   . GERD (gastroesophageal reflux disease)   . Vitamin D deficiency   . Dementia   . Chronic pain   . Chronic kidney disease   . HCAP (healthcare-associated pneumonia) 05/17/2013  . GASTRIC ULCER 01/01/2008    Qualifier: Diagnosis of  By: Andrey Campanile MD, Raliegh Ip    . DIVERTICULITIS, HX OF 01/01/2008    Qualifier: Diagnosis of  By: Andrey Campanile MD, Raliegh Ip    . COLONIC POLYPS, HX OF 01/01/2008    Qualifier: Diagnosis of  By: Andrey Campanile MD, LauraLee     No past surgical history on file.  Allergies  Allergen Reactions  . Sulfonamide Derivatives       Medication List       This list is accurate as of: 07/13/15  4:16 PM.  Always use your most recent med list.               Acetaminophen 325 MG Caps  Take 650 mg by mouth every 6 (six) hours as needed.     amLODipine 5 MG  tablet  Commonly known as:  NORVASC  Take 5 mg by mouth daily.     calcium carbonate 600 MG Tabs tablet  Commonly known as:  OS-CAL  Take 600 mg by mouth 2 (two) times daily with a meal.     ketoconazole 2 % shampoo  Commonly known as:  NIZORAL  Apply 1 application topically 2 (two) times a week. On bath days     levothyroxine 25 MCG tablet  Commonly known as:  SYNTHROID, LEVOTHROID  Take 25 mcg by mouth daily before breakfast.     sennosides-docusate sodium 8.6-50 MG tablet  Commonly known as:  SENOKOT-S  Take 1 tablet by mouth at bedtime.     traMADol 50 MG tablet  Commonly known as:  ULTRAM  Take 50-100 mg by mouth every 6 (six) hours as needed.     UNABLE TO FIND  Med Name: Med pass 120 mL daily     zinc oxide 11.3 % Crea cream  Commonly known as:  BALMEX  Apply 1 application topically. Every shift        Immunization History  Administered Date(s) Administered  . Influenza-Unspecified 02/08/2014, 03/03/2015  . PPD Test 01/17/2014, 01/10/2015  . Pneumococcal-Unspecified 11/08/2009   Pertinent  Health  Maintenance Due  Topic Date Due  . PNA vac Low Risk Adult (2 of 2 - PCV13) 07/01/2016 (Originally 11/09/2010)  . INFLUENZA VACCINE  12/05/2015  . DEXA SCAN  Completed   No flowsheet data found.  Filed Vitals:   07/13/15 1604  BP: 152/60  Pulse: 99  Temp: 97.4 F (36.3 C)  TempSrc: Oral  Resp: 18  Height: 5' (1.524 m)  Weight: 121 lb 11.2 oz (55.203 kg)   Body mass index is 23.77 kg/(m^2). Physical Exam  Constitutional:  Frail elderly in no acute distress.   HENT:  Head: Normocephalic.  Unable to examine oropharynx patient does not follow commands.   Eyes: Pupils are equal, round, and reactive to light. Right eye exhibits no discharge. Left eye exhibits no discharge. No scleral icterus.  Neck: Normal range of motion.  Tender palpation   Cardiovascular: Normal rate and regular rhythm.   Pulmonary/Chest: Effort normal.  Diminished breath sounds.     Abdominal: Soft. Bowel sounds are normal.  Musculoskeletal: Normal range of motion. She exhibits no edema.  Lymphadenopathy:    She has no cervical adenopathy.  Neurological:  Alert and oriented to self.   Skin: Skin is warm and dry.  Psychiatric: She has a normal mood and affect.    Labs reviewed:  Recent Labs  02/02/15 05/29/15 06/23/15  NA 135* 141 140  K 4.0 4.0 4.2  BUN 25* 24* 19  CREATININE 1.0 0.9 1.0    Recent Labs  05/29/15 06/23/15  AST 20 18  ALT 9 7  ALKPHOS 62 67    Recent Labs  02/02/15 05/29/15 06/23/15  WBC 11.1 9.7 10.3  HGB 14.4 15.2 14.8  HCT 44 46 45  PLT 236 298 237   Lab Results  Component Value Date   TSH 2.11 06/23/2015    Assessment/Plan 1.  sore throat Afebrile.Unable to assess oral pharynx but tender to palpation. Will initiate Tylenol 325 mg Tablet two tabs every 8 Hrs X 48 hours then change to PRN. Facility to monitor V/s once daily X 3 days.   2. Cough Non-productive cough with diminished breath sounds.Will order portable CXR 2 views. Get CBC with diff and CMP to evaluate further. Continue with Guaifenesin PRN.  3. Dementia  No new behavioral issues reported. Continue to monitor.       Family/ staff Communication: Reviewed plan with facility staff  Labs/tests ordered:   CBC/diff, CMP   Oneal GroutMAHIMA Mekenna Finau, MD  [facility] [phone] (Monday-Friday 8 am - 5 pm) [phone] (afterhours)

## 2015-07-14 NOTE — Progress Notes (Signed)
This encounter was created in error - please disregard.

## 2015-07-21 ENCOUNTER — Non-Acute Institutional Stay (SKILLED_NURSING_FACILITY): Payer: Medicare Other | Admitting: Family

## 2015-07-21 DIAGNOSIS — I10 Essential (primary) hypertension: Secondary | ICD-10-CM

## 2015-07-21 DIAGNOSIS — J449 Chronic obstructive pulmonary disease, unspecified: Secondary | ICD-10-CM | POA: Diagnosis not present

## 2015-07-21 DIAGNOSIS — L219 Seborrheic dermatitis, unspecified: Secondary | ICD-10-CM

## 2015-07-21 DIAGNOSIS — K219 Gastro-esophageal reflux disease without esophagitis: Secondary | ICD-10-CM

## 2015-07-21 DIAGNOSIS — L218 Other seborrheic dermatitis: Secondary | ICD-10-CM

## 2015-07-21 DIAGNOSIS — E039 Hypothyroidism, unspecified: Secondary | ICD-10-CM

## 2015-07-21 NOTE — Progress Notes (Signed)
Patient ID: Shannon Gomez, female   DOB: 05/21/1913, 82 y.o.   MRN: [id number]  Location:  [facility]   Place of Service:  SNF [phone](31) Provider:  Oneal GroutPANDEY, MAHIMA, MD  Patient Care Team: Oneal GroutMahima Pandey, MD as PCP - General (Internal Medicine) Sharee Holstereborah S Green, NP as Nurse Practitioner (Nurse Practitioner)  Extended Emergency Contact Information Primary Emergency Contact: Neese,Bill Address: [address]          [place], [place] [address] [place] Home Phone: [phone] Relation: Nephew  Code Status:  DNR Goals of care: Advanced Directive information Advanced Directives 06/21/2015  Does patient have an advance directive? -  Type of Estate agentAdvance Directive Healthcare Power of [place];Out of facility DNR (pink MOST or yellow form)  Does patient want to make changes to advanced directive? No - Patient declined  Copy of advanced directive(s) in chart? Yes     Chief Complaint  Patient presents with  . Medical Management of Chronic Issues    HPI:  Pt is a 82 y.o. female [facility] She has a medical history of HTN, COPD, GERD, Hypothyroidism among others. She is seen in her room today watching TV. She denies any acute issues   Past Medical History  Diagnosis Date  . Thyroid disease   . Hypertension   . GERD (gastroesophageal reflux disease)   . Vitamin D deficiency   . Dementia   . Chronic pain   . Chronic kidney disease   . HCAP (healthcare-associated pneumonia) 05/17/2013  . GASTRIC ULCER 01/01/2008    Qualifier: Diagnosis of  By: Andrey CampanileWilson MD, Raliegh IpLauraLee    . DIVERTICULITIS, HX OF 01/01/2008    Qualifier: Diagnosis of  By: Andrey CampanileWilson MD, Raliegh IpLauraLee    . COLONIC POLYPS, HX OF 01/01/2008    Qualifier: Diagnosis of  By: Andrey CampanileWilson MD, LauraLee     No past surgical history on file.  Allergies  Allergen Reactions  . Sulfonamide Derivatives       Medication List       This list is  accurate as of: 07/21/15 12:34 PM.  Always use your most recent med list.               Acetaminophen 325 MG Caps  Take 650 mg by mouth every 6 (six) hours as needed.     amLODipine 5 MG tablet  Commonly known as:  NORVASC  Take 5 mg by mouth daily.     calcium carbonate 600 MG Tabs tablet  Commonly known as:  OS-CAL  Take 600 mg by mouth 2 (two) times daily with a meal.     ketoconazole 2 % shampoo  Commonly known as:  NIZORAL  Apply 1 application topically 2 (two) times a week. On bath days     levothyroxine 25 MCG tablet  Commonly known as:  SYNTHROID, LEVOTHROID  Take 25 mcg by mouth daily before breakfast.     sennosides-docusate sodium 8.6-50 MG tablet  Commonly known as:  SENOKOT-S  Take 1 tablet by mouth at bedtime.     traMADol 50 MG tablet  Commonly known as:  ULTRAM  Take 50-100 mg by mouth every 6 (six) hours as needed.     UNABLE TO FIND  Med Name: Med pass 120 mL daily     zinc oxide 11.3 % Crea cream  Commonly known as:  BALMEX  Apply 1 application topically. Every shift  Review of Systems  Unable to perform ROS: Dementia    Immunization History  Administered Date(s) Administered  . Influenza-Unspecified 02/08/2014, 03/03/2015  . PPD Test 01/17/2014, 01/10/2015  . Pneumococcal-Unspecified 11/08/2009   Pertinent  Health Maintenance Due  Topic Date Due  . PNA vac Low Risk Adult (2 of 2 - PCV13) 07/01/2016 (Originally 11/09/2010)  . INFLUENZA VACCINE  12/05/2015  . DEXA SCAN  Completed   No flowsheet data found. Functional Status Survey:    Filed Vitals:   07/21/15 1229  BP: 126/72  Pulse: 74  Temp: 98.5 F (36.9 C)  Resp: 18  Weight: 121 lb 3.2 oz (54.976 kg)  SpO2: 98%   Body mass index is 23.67 kg/(m^2). Physical Exam  Labs reviewed:  Recent Labs  02/02/15 05/29/15 06/23/15  NA 135* 141 140  K 4.0 4.0 4.2  BUN 25* 24* 19  CREATININE 1.0 0.9 1.0    Recent Labs  05/29/15 06/23/15  AST 20 18  ALT 9 7  ALKPHOS 62  67    Recent Labs  02/02/15 05/29/15 06/23/15  WBC 11.1 9.7 10.3  HGB 14.4 15.2 14.8  HCT 44 46 45  PLT 236 298 237   Lab Results  Component Value Date   TSH 2.11 06/23/2015   No results found for: HGBA1C No results found for: CHOL, HDL, LDLCALC, LDLDIRECT, TRIG, CHOLHDL  Significant Diagnostic Results in last 30 days:  No results found.  Assessment/Plan 1. Essential hypertension, benign B/p stable. Continue Amlodipine 5 mg Tablet. Monitor BMP  2. Gastroesophageal reflux disease without esophagitis Asymptomatic. Continue to monitor.   3. Chronic obstructive pulmonary disease, unspecified COPD type (HCC) Stable.Exam findings negative for cough or wheezes. Continue to monitor.   4. Hypothyroidism, unspecified hypothyroidism type Recent TSH 2.503 ( 07/11/2015) Continue levothyroxine 25 mcg Tablet   5. Seborrheic dermatitis of scalp Has improved. Continue with Ketoconazole 2 % topical shampoo. Monitor.     Family/ staff Communication: Reviwed plan of care with patient and Ecologist.   Labs/tests ordered:  None

## 2015-08-15 ENCOUNTER — Non-Acute Institutional Stay (SKILLED_NURSING_FACILITY): Payer: Medicare Other | Admitting: Family

## 2015-08-15 ENCOUNTER — Encounter: Payer: Self-pay | Admitting: Family

## 2015-08-15 DIAGNOSIS — F0390 Unspecified dementia without behavioral disturbance: Secondary | ICD-10-CM

## 2015-08-15 DIAGNOSIS — I10 Essential (primary) hypertension: Secondary | ICD-10-CM | POA: Diagnosis not present

## 2015-08-15 DIAGNOSIS — J449 Chronic obstructive pulmonary disease, unspecified: Secondary | ICD-10-CM | POA: Diagnosis not present

## 2015-08-15 DIAGNOSIS — L218 Other seborrheic dermatitis: Secondary | ICD-10-CM | POA: Diagnosis not present

## 2015-08-15 DIAGNOSIS — L570 Actinic keratosis: Secondary | ICD-10-CM

## 2015-08-15 DIAGNOSIS — L219 Seborrheic dermatitis, unspecified: Secondary | ICD-10-CM

## 2015-08-15 DIAGNOSIS — E039 Hypothyroidism, unspecified: Secondary | ICD-10-CM

## 2015-08-15 NOTE — Progress Notes (Signed)
Location:  [facility] Room Number: 901 A Place of Service:    Provider:  Richarda Blade, NP  Oneal Grout, MD  Patient Care Team: Oneal Grout, MD as PCP - General (Internal Medicine) Sharee Holster, NP as Nurse Practitioner (Nurse Practitioner)  Extended Emergency Contact Information Primary Emergency Contact: Neese,Bill Address: [address]          [place], [place] [address] [place] Home Phone: [phone] Relation: Nephew  Code Status:  DNR Goals of care: Advanced Directive information Advanced Directives 08/15/2015  Does patient have an advance directive? Yes  Type of Advance Directive Out of facility DNR (pink MOST or yellow form)  Does patient want to make changes to advanced directive? No - Patient declined  Copy of advanced directive(s) in chart? Yes     Chief Complaint  Patient presents with  . Medical Management of Chronic Issues    Routine Visit    HPI:  Pt is a 82 y.o. female seen today at [facility] for medical management of chronic diseases. She has a medical history of HTN, Hypothyroidism, GERD, CKD, Dementia, Chronic pain among others. She denies any acute issues this visit though HPI and ROS limited due to dementia. Facility staff reports no new concerns. Previous  Seborrheic dermatisis of the scalp has improved with previous prescribed Ketonazole 2 % shampoo.    Past Medical History  Diagnosis Date  . Thyroid disease   . Hypertension   . GERD (gastroesophageal reflux disease)   . Vitamin D deficiency   . Dementia   . Chronic pain   . Chronic kidney disease   . HCAP (healthcare-associated pneumonia) 05/17/2013  . GASTRIC ULCER 01/01/2008    Qualifier: Diagnosis of  By: Andrey Campanile MD, Raliegh Ip    . DIVERTICULITIS, HX OF 01/01/2008    Qualifier: Diagnosis of  By: Andrey Campanile MD, Raliegh Ip    . COLONIC POLYPS, HX OF 01/01/2008    Qualifier: Diagnosis of  By: Andrey Campanile MD, LauraLee     No past  surgical history on file.  Allergies  Allergen Reactions  . Sulfonamide Derivatives       Medication List       This list is accurate as of: 08/15/15  2:54 PM.  Always use your most recent med list.               Acetaminophen 325 MG Caps  Take 650 mg by mouth every 6 (six) hours as needed.     amLODipine 5 MG tablet  Commonly known as:  NORVASC  Take 5 mg by mouth daily.     calcium carbonate 600 MG Tabs tablet  Commonly known as:  OS-CAL  Take 600 mg by mouth 2 (two) times daily with a meal.     ketoconazole 2 % shampoo  Commonly known as:  NIZORAL  Apply 1 application topically 2 (two) times a week. On bath days     levothyroxine 25 MCG tablet  Commonly known as:  SYNTHROID, LEVOTHROID  Take 25 mcg by mouth daily before breakfast.     sennosides-docusate sodium 8.6-50 MG tablet  Commonly known as:  SENOKOT-S  Take 1 tablet by mouth at bedtime.     traMADol 50 MG tablet  Commonly known as:  ULTRAM  Take 50-100 mg by mouth every 6 (six) hours as needed for moderate pain or severe pain.     UNABLE TO FIND  Med Name: Med pass 120  mL daily     zinc oxide 11.3 % Crea cream  Commonly known as:  BALMEX  Apply 1 application topically. Every shift        Review of Systems  Unable to perform ROS: Dementia    Immunization History  Administered Date(s) Administered  . Influenza-Unspecified 02/08/2014, 03/03/2015  . PPD Test 01/17/2014, 01/10/2015  . Pneumococcal-Unspecified 11/08/2009   Pertinent  Health Maintenance Due  Topic Date Due  . PNA vac Low Risk Adult (2 of 2 - PCV13) 07/01/2016 (Originally 11/09/2010)  . INFLUENZA VACCINE  12/05/2015  . DEXA SCAN  Completed   No flowsheet data found. Functional Status Survey:    Filed Vitals:   08/15/15 0910  BP: 133/79  Pulse: 69  Temp: 98.7 F (37.1 C)  Resp: 19  Height: 5' (1.524 m)  Weight: 123 lb 9.6 oz (56.065 kg)  SpO2: 94%   Body mass index is 24.14 kg/(m^2). Physical Exam  Constitutional:  She appears well-developed. No distress.  Elderly   HENT:  Head: Normocephalic.  Mouth/Throat: Oropharynx is clear and moist.  Eyes: Conjunctivae and EOM are normal. Pupils are equal, round, and reactive to light. Right eye exhibits no discharge. Left eye exhibits no discharge. No scleral icterus.  Neck: Normal range of motion. No JVD present. No thyromegaly present.  Cardiovascular: Normal rate, regular rhythm, normal heart sounds and intact distal pulses.  Exam reveals no gallop and no friction rub.   No murmur heard. Pulmonary/Chest: Effort normal and breath sounds normal. No respiratory distress. She has no wheezes. She has no rales.  Abdominal: Soft. Bowel sounds are normal. She exhibits no distension and no mass. There is no tenderness. There is no rebound and no guarding.  Musculoskeletal: Normal range of motion. She exhibits no edema or tenderness.  Lymphadenopathy:    She has no cervical adenopathy.  Neurological: She is alert.  Skin: Skin is warm and dry. No rash noted. No erythema. No pallor.  Small amounts of scaly dandruff noted on scalp. Overall much improvement compared to previous.  Left neck fleshy skin with scaly irregular shaped noted.   Psychiatric: She has a normal mood and affect.    Labs reviewed:  Recent Labs  05/29/15 06/23/15 07/11/15  NA 141 140 139  K 4.0 4.2 4.2  BUN 24* 19 32*  CREATININE 0.9 1.0 0.9    Recent Labs  05/29/15 06/23/15  AST 20 18  ALT 9 7  ALKPHOS 62 67    Recent Labs  02/02/15 05/29/15 06/23/15  WBC 11.1 9.7 10.3  HGB 14.4 15.2 14.8  HCT 44 46 45  PLT 236 298 237   Lab Results  Component Value Date   TSH 2.50 07/11/2015   No results found for: HGBA1C No results found for: CHOL, HDL, LDLCALC, LDLDIRECT, TRIG, CHOLHDL  Significant Diagnostic Results in last 30 days:  No results found.  Assessment/Plan Hypertension B/p stable. Continue on Amlodipine COPD Stable.Afebrile.No increased shortness of breath, wheezing or  sputum production. Continue to monitor.  Hypothyroidism  Continue on Levothyroxine 25 mcg Tablet. Monitor TSH level. Dementia Progressive decline. No new behavioral issues reported by facility staff. Continue to assist with ADL's . Aspiration and fall precaution. Skin Care.    Seborrheic dermatitis scalp  Has improved. Continue on Ketoconazole 2% Shampoo.  Actinic Keratosis  Left side neck greater than 1 cm fleshy  scaly irregular edges. Will refer to Dermatologist if okay with HCPOA.continue to monitor.   Family/ staff Communication: Reviewed plan with facility  Nurse Supervisor.   Labs/tests ordered:  None

## 2015-08-24 DIAGNOSIS — M79671 Pain in right foot: Secondary | ICD-10-CM | POA: Diagnosis not present

## 2015-08-24 DIAGNOSIS — B351 Tinea unguium: Secondary | ICD-10-CM | POA: Diagnosis not present

## 2015-08-24 DIAGNOSIS — I2510 Atherosclerotic heart disease of native coronary artery without angina pectoris: Secondary | ICD-10-CM | POA: Diagnosis not present

## 2015-08-24 DIAGNOSIS — M79672 Pain in left foot: Secondary | ICD-10-CM | POA: Diagnosis not present

## 2015-09-13 ENCOUNTER — Non-Acute Institutional Stay (SKILLED_NURSING_FACILITY): Payer: Medicare Other | Admitting: Family

## 2015-09-13 ENCOUNTER — Encounter: Payer: Self-pay | Admitting: Family

## 2015-09-13 DIAGNOSIS — I10 Essential (primary) hypertension: Secondary | ICD-10-CM

## 2015-09-13 DIAGNOSIS — E039 Hypothyroidism, unspecified: Secondary | ICD-10-CM

## 2015-09-13 DIAGNOSIS — R634 Abnormal weight loss: Secondary | ICD-10-CM

## 2015-09-13 DIAGNOSIS — F0390 Unspecified dementia without behavioral disturbance: Secondary | ICD-10-CM | POA: Diagnosis not present

## 2015-09-13 DIAGNOSIS — K5901 Slow transit constipation: Secondary | ICD-10-CM

## 2015-09-13 NOTE — Progress Notes (Signed)
Location:  [facility] Room Number: 901 A Place of Service:  SNF [phone](31) Provider:  Richarda Bladeinah Ngetich, NP  Oneal GroutPANDEY, MAHIMA, MD  Patient Care Team: Oneal GroutMahima Pandey, MD as PCP - General (Internal Medicine) Sharee Holstereborah S Green, NP as Nurse Practitioner (Nurse Practitioner)  Extended Emergency Contact Information Primary Emergency Contact: Neese,Bill Address: [address]          [place], [place] [address] [place] Home Phone: [phone] Relation: Nephew  Code Status: DNR  Goals of care: Advanced Directive information Advanced Directives 09/13/2015  Does patient have an advance directive? Yes  Type of Advance Directive Out of facility DNR (pink MOST or yellow form)  Does patient want to make changes to advanced directive? No - Patient declined  Copy of advanced directive(s) in chart? Yes     Chief Complaint  Patient presents with  . Medical Management of Chronic Issues    Routine Visit    HPI:  Pt is a 82 y.o. female seen today at [facility] for medical management of chronic diseases. She has a medical history of HTN, Hypothyroidism, Constipation, Dementia among others. She is seen in her room today. She denies any acute issues though HPI and ROS limited due to presence of dementia. Facility staff reports no new concerns. She has had gradual weight loss currently on a fortified diet.    Past Medical History  Diagnosis Date  . Thyroid disease   . Hypertension   . GERD (gastroesophageal reflux disease)   . Vitamin D deficiency   . Dementia   . Chronic pain   . Chronic kidney disease   . HCAP (healthcare-associated pneumonia) 05/17/2013  . GASTRIC ULCER 01/01/2008    Qualifier: Diagnosis of  By: Andrey CampanileWilson MD, Raliegh IpLauraLee    . DIVERTICULITIS, HX OF 01/01/2008    Qualifier: Diagnosis of  By: Andrey CampanileWilson MD, Raliegh IpLauraLee    . COLONIC POLYPS, HX OF 01/01/2008    Qualifier: Diagnosis of  By: Andrey CampanileWilson MD, LauraLee     No past surgical  history on file.  Allergies  Allergen Reactions  . Sulfonamide Derivatives       Medication List       This list is accurate as of: 09/13/15 12:19 PM.  Always use your most recent med list.               Acetaminophen 325 MG Caps  Take 650 mg by mouth every 6 (six) hours as needed.     amLODipine 5 MG tablet  Commonly known as:  NORVASC  Take 5 mg by mouth daily.     calcium carbonate 600 MG Tabs tablet  Commonly known as:  OS-CAL  Take 600 mg by mouth 2 (two) times daily with a meal.     ketoconazole 2 % shampoo  Commonly known as:  NIZORAL  Apply 1 application topically 2 (two) times a week. On bath days     levothyroxine 25 MCG tablet  Commonly known as:  SYNTHROID, LEVOTHROID  Take 25 mcg by mouth daily before breakfast.     sennosides-docusate sodium 8.6-50 MG tablet  Commonly known as:  SENOKOT-S  Take 1 tablet by mouth at bedtime.     UNABLE TO FIND  Med Name: Med pass 120 mL daily     zinc oxide 11.3 % Crea cream  Commonly known as:  BALMEX  Apply 1 application topically. Every shift        Review of  Systems  Unable to perform ROS: Dementia    Immunization History  Administered Date(s) Administered  . Influenza-Unspecified 02/08/2014, 03/03/2015  . PPD Test 01/17/2014, 01/10/2015  . Pneumococcal-Unspecified 11/08/2009   Pertinent  Health Maintenance Due  Topic Date Due  . PNA vac Low Risk Adult (2 of 2 - PCV13) 07/01/2016 (Originally 11/09/2010)  . INFLUENZA VACCINE  12/05/2015  . DEXA SCAN  Completed   No flowsheet data found. Functional Status Survey:    Filed Vitals:   09/13/15 1208  BP: 152/66  Pulse: 74  Temp: 98 F (36.7 C)  Height: 5' (1.524 m)  Weight: 121 lb 12.8 oz (55.248 kg)  SpO2: 97%   Body mass index is 23.79 kg/(m^2). Physical Exam  Constitutional:  Frail Elderly in no acute distress.   HENT:  Head: Normocephalic.  Mouth/Throat: Oropharynx is clear and moist.  Eyes: Conjunctivae and EOM are normal. Pupils are  equal, round, and reactive to light. Right eye exhibits no discharge. Left eye exhibits no discharge. No scleral icterus.  Neck: Normal range of motion. No JVD present. No thyromegaly present.  Cardiovascular: Normal rate, regular rhythm, normal heart sounds and intact distal pulses.  Exam reveals no gallop and no friction rub.   No murmur heard. Pulmonary/Chest: Effort normal and breath sounds normal. No respiratory distress. She has no wheezes. She has no rales.  Abdominal: Soft. Bowel sounds are normal. She exhibits no distension. There is no tenderness. There is no rebound and no guarding.  Musculoskeletal: Normal range of motion. She exhibits no edema or tenderness.  Lymphadenopathy:    She has no cervical adenopathy.  Neurological: She is alert.  Skin: Skin is warm and dry. No rash noted. No erythema. No pallor.  Psychiatric: She has a normal mood and affect.    Labs reviewed:  Recent Labs  05/29/15 06/23/15 07/11/15  NA 141 140 139  K 4.0 4.2 4.2  BUN 24* 19 32*  CREATININE 0.9 1.0 0.9    Recent Labs  05/29/15 06/23/15  AST 20 18  ALT 9 7  ALKPHOS 62 67    Recent Labs  02/02/15 05/29/15 06/23/15  WBC 11.1 9.7 10.3  HGB 14.4 15.2 14.8  HCT 44 46 45  PLT 236 298 237   Lab Results  Component Value Date   TSH 2.50 07/11/2015   No results found for: HGBA1C No results found for: CHOL, HDL, LDLCALC, LDLDIRECT, TRIG, CHOLHDL  Assessment/Plan Hypothyroidism Continue on levothyroxine 25 mcg tablet. Last TSH level 2.50 ( 07/11/2015). TSH level in  4 weeks.  HTN B/p stable. Continue Amlodipine 5 mg Tablet. Monitor CBC, BMP in 4 weeks.   Constipation  Sennosides-doc effective. Continue to encourage dietary intake. Encourage Fluid.   Dementia  No new behavioral issues. Continue to assisit with ADL's. Fall and safety precautions. Skin care.   Weight loss  Has had 1.8 lbs weight loss over two months. Continue with fortified meals.BMP in 4 weeks.    Family/ staff  Communication: Reviewed plan of care with facility Nurse supervisor.  Labs/tests ordered: TSH, CBC, BMP in 4 weeks

## 2015-09-22 DIAGNOSIS — L820 Inflamed seborrheic keratosis: Secondary | ICD-10-CM | POA: Diagnosis not present

## 2015-10-10 ENCOUNTER — Non-Acute Institutional Stay (SKILLED_NURSING_FACILITY): Payer: Medicare Other | Admitting: Family

## 2015-10-10 ENCOUNTER — Encounter: Payer: Self-pay | Admitting: Family

## 2015-10-10 DIAGNOSIS — I10 Essential (primary) hypertension: Secondary | ICD-10-CM | POA: Diagnosis not present

## 2015-10-10 DIAGNOSIS — E039 Hypothyroidism, unspecified: Secondary | ICD-10-CM

## 2015-10-10 DIAGNOSIS — J449 Chronic obstructive pulmonary disease, unspecified: Secondary | ICD-10-CM | POA: Diagnosis not present

## 2015-10-10 DIAGNOSIS — F0390 Unspecified dementia without behavioral disturbance: Secondary | ICD-10-CM | POA: Diagnosis not present

## 2015-10-10 DIAGNOSIS — R627 Adult failure to thrive: Secondary | ICD-10-CM | POA: Diagnosis not present

## 2015-10-10 NOTE — Progress Notes (Signed)
Location:  [facility] Room Number: 901 A Place of Service:  SNF [phone] Provider:  Richarda Bladeinah Michaelyn Wall, NP  Oneal GroutPANDEY, MAHIMA, MD  Patient Care Team: Oneal GroutMahima Pandey, MD as PCP - General (Internal Medicine) Sharee Holstereborah S Green, NP as Nurse Practitioner (Nurse Practitioner)  Extended Emergency Contact Information Primary Emergency Contact: Neese,Bill Address: [address]          [place], [place] [address] [place] Home Phone: [phone] Relation: Nephew  Code Status:  DNR Goals of care: Advanced Directive information Advanced Directives 10/10/2015  Does patient have an advance directive? Yes  Type of Advance Directive Out of facility DNR (pink MOST or yellow form)  Does patient want to make changes to advanced directive? No - Patient declined  Copy of advanced directive(s) in chart? Yes     Chief Complaint  Patient presents with  . Medical Management of Chronic Issues    Routine Visit    HPI:  Pt is a 82 y.o. female [facility] at [facility]  for medical management of chronic diseases. She is seen in her room today drinking her coffee which she states likes it so much. She states drinks several cups per day without any effects. She denies any acute issues though history limited due to presence of dementia. She has had 1.8 pounds weight loss over the past three months. She continues to require assistance with ADL's. Facility staff reports no new behavioral issues.    Past Medical History  Diagnosis Date  . Thyroid disease   . Hypertension   . GERD (gastroesophageal reflux disease)   . Vitamin D deficiency   . Dementia   . Chronic pain   . Chronic kidney disease   . HCAP (healthcare-associated pneumonia) 05/17/2013  . GASTRIC ULCER 01/01/2008    Qualifier: Diagnosis of  By: Andrey CampanileWilson MD, Raliegh IpLauraLee    . DIVERTICULITIS, HX OF 01/01/2008    Qualifier: Diagnosis of  By: Andrey CampanileWilson MD, Raliegh IpLauraLee    . COLONIC POLYPS, HX OF 01/01/2008    Qualifier: Diagnosis of  By: Andrey CampanileWilson MD, Raliegh IpLauraLee     History reviewed. No pertinent past surgical history.  Allergies  Allergen Reactions  . Sulfonamide Derivatives       Medication List       This list is accurate as of: 10/10/15  2:42 PM.  Always use your most recent med list.               Acetaminophen 325 MG Caps  Take 650 mg by mouth every 6 (six) hours as needed.     amLODipine 5 MG tablet  Commonly known as:  NORVASC  Take 5 mg by mouth daily.     calcium carbonate 600 MG Tabs tablet  Commonly known as:  OS-CAL  Take 600 mg by mouth 2 (two) times daily with a meal.     ketoconazole 2 % shampoo  Commonly known as:  NIZORAL  Apply 1 application topically 2 (two) times a week. On bath days     levothyroxine 25 MCG tablet  Commonly known as:  SYNTHROID, LEVOTHROID  Take 25 mcg by mouth daily before breakfast.     sennosides-docusate sodium 8.6-50 MG tablet  Commonly known as:  SENOKOT-S  Take 1 tablet by mouth at bedtime.     UNABLE TO FIND  Med Name: Med pass 120 mL daily     zinc oxide 11.3 % Crea cream  Commonly known as:  BALMEX  Apply 1 application topically. Every shift        Review of Systems  Unable to perform ROS: Dementia    Immunization History  Administered Date(s) Administered  . Influenza-Unspecified 02/08/2014, 03/03/2015  . PPD Test 01/17/2014, 01/10/2015  . Pneumococcal-Unspecified 11/08/2009   Pertinent  Health Maintenance Due  Topic Date Due  . PNA vac Low Risk Adult (2 of 2 - PCV13) 07/01/2016 (Originally 11/09/2010)  . INFLUENZA VACCINE  12/05/2015  . DEXA SCAN  Completed   No flowsheet data found. Functional Status Survey:    Filed Vitals:   10/10/15 1434  BP: 153/61  Pulse: 72  Temp: 98.3 F (36.8 C)  Resp: 17  Height: 5' (1.524 m)  Weight: 122 lb 14.4 oz (55.747 kg)  SpO2: 96%   Body mass index is 24 kg/(m^2). Physical Exam  Constitutional:  Frail Elderly in no acute distress.   HENT:  Head:  Normocephalic.  Mouth/Throat: Oropharynx is clear and moist.  Eyes: Conjunctivae and EOM are normal. Pupils are equal, round, and reactive to light. Right eye exhibits no discharge. Left eye exhibits no discharge. No scleral icterus.  Neck: Normal range of motion. No JVD present. No thyromegaly present.  Cardiovascular: Normal rate, regular rhythm, normal heart sounds and intact distal pulses.  Exam reveals no gallop and no friction rub.   No murmur heard. Pulmonary/Chest: Effort normal and breath sounds normal. No respiratory distress. She has no wheezes. She has no rales.  Abdominal: Soft. Bowel sounds are normal. She exhibits no distension. There is no tenderness. There is no rebound and no guarding.  Genitourinary:  Incontinent   Musculoskeletal: Normal range of motion. She exhibits no edema or tenderness.  Bed bound   Lymphadenopathy:    She has no cervical adenopathy.  Neurological: She is alert.  Skin: Skin is warm and dry. No rash noted. No erythema. No pallor.  Psychiatric: She has a normal mood and affect.    Labs reviewed:  Recent Labs  05/29/15 06/23/15 07/11/15  NA 141 140 139  K 4.0 4.2 4.2  BUN 24* 19 32*  CREATININE 0.9 1.0 0.9    Recent Labs  05/29/15 06/23/15  AST 20 18  ALT 9 7  ALKPHOS 62 67    Recent Labs  02/02/15 05/29/15 06/23/15  WBC 11.1 9.7 10.3  HGB 14.4 15.2 14.8  HCT 44 46 45  PLT 236 298 237   Lab Results  Component Value Date   TSH 2.50 07/11/2015   No results found for: HGBA1C No results found for: CHOL, HDL, LDLCALC, LDLDIRECT, TRIG, CHOLHDL  Significant Diagnostic Results in last 30 days:  No results found.  Assessment/Plan Dementia No new behavioral issues reported. Continue to assist with ADL's. Pressure ulcers prevention measures. Fall and safety precautions.   Hypothyroidism  Continue on Levothyroxine 25 mcg tablet. TSH due this month per previous visit orders.   HTN Sys B/p ranges in the 140's -150's/60's and HR  upper 50's-70's. Continue on Amlodipine 5 mg Tablet. Will adjust medication if >160.   COPD Stable. On Room air . Continue to monitor.Check CBC  Failure to Thrive Has had progressive weight loss. Continue to encourage oral intake. Check BMP   Family/ staff Communication: Reviewed plan with [patient and facility Nurse supervisor.  Labs/tests ordered:  CBC, BMP, TSH ordered for this month in previous visit.

## 2015-10-13 DIAGNOSIS — E038 Other specified hypothyroidism: Secondary | ICD-10-CM | POA: Diagnosis not present

## 2015-10-13 DIAGNOSIS — D508 Other iron deficiency anemias: Secondary | ICD-10-CM | POA: Diagnosis not present

## 2015-11-22 DIAGNOSIS — I70203 Unspecified atherosclerosis of native arteries of extremities, bilateral legs: Secondary | ICD-10-CM | POA: Diagnosis not present

## 2015-11-22 DIAGNOSIS — B351 Tinea unguium: Secondary | ICD-10-CM | POA: Diagnosis not present

## 2015-11-22 DIAGNOSIS — M79674 Pain in right toe(s): Secondary | ICD-10-CM | POA: Diagnosis not present

## 2015-11-22 DIAGNOSIS — M79675 Pain in left toe(s): Secondary | ICD-10-CM | POA: Diagnosis not present

## 2015-11-30 ENCOUNTER — Non-Acute Institutional Stay (SKILLED_NURSING_FACILITY): Payer: Medicare Other | Admitting: Family

## 2015-11-30 DIAGNOSIS — J449 Chronic obstructive pulmonary disease, unspecified: Secondary | ICD-10-CM | POA: Diagnosis not present

## 2015-11-30 DIAGNOSIS — M159 Polyosteoarthritis, unspecified: Secondary | ICD-10-CM

## 2015-11-30 DIAGNOSIS — E039 Hypothyroidism, unspecified: Secondary | ICD-10-CM

## 2015-11-30 DIAGNOSIS — K5901 Slow transit constipation: Secondary | ICD-10-CM

## 2015-11-30 DIAGNOSIS — M150 Primary generalized (osteo)arthritis: Secondary | ICD-10-CM

## 2015-11-30 DIAGNOSIS — I10 Essential (primary) hypertension: Secondary | ICD-10-CM

## 2015-11-30 NOTE — Progress Notes (Signed)
Location:    [facility]    Place of Service:  SNF [phone]) Provider:  Dinah Ngetich FNP-C   Oneal Grout, MD  Patient Care Team: Oneal Grout, MD as PCP - General (Internal Medicine) Sharee Holster, NP as Nurse Practitioner (Nurse Practitioner)  Extended Emergency Contact Information Primary Emergency Contact: Neese,Bill Address: [address]          [place], [place] [address] [place] Home Phone: [phone] Relation: Nephew  Code Status: DNR  Goals of care: Advanced Directive information Advanced Directives 10/10/2015  Does patient have an advance directive? Yes  Type of Advance Directive Out of facility DNR (pink MOST or yellow form)  Does patient want to make changes to advanced directive? No - Patient declined  Copy of advanced directive(s) in chart? Yes     Chief Complaint  Patient presents with  . Medical Management of Chronic Issues    HPI:  Pt is a 82 y.o. female seen today at [facility]   for medical management of chronic diseases.She is seen in her room today with facility NA at bedside.She denies any acute issues this visit though facility NA states patient complains of pain on the hands due to OA.Patient states tylenol helps relief the pain. She continues to require assistance with ADL's. No skin breakdown. Has had no recent fall or hospital admission since previous visit.    Past Medical History:  Diagnosis Date  . Chronic kidney disease   . Chronic pain   . COLONIC POLYPS, HX OF 01/01/2008   Qualifier: Diagnosis of  By: Andrey Campanile MD, Raliegh Ip    . Dementia   . DIVERTICULITIS, HX OF 01/01/2008   Qualifier: Diagnosis of  By: Andrey Campanile MD, Raliegh Ip    . GASTRIC ULCER 01/01/2008   Qualifier: Diagnosis of  By: Andrey Campanile MD, Raliegh Ip    . GERD (gastroesophageal reflux disease)   . HCAP (healthcare-associated pneumonia) 05/17/2013  . Hypertension   . Thyroid disease   . Vitamin D deficiency     Allergies    Allergen Reactions  . Sulfonamide Derivatives       Medication List       Accurate as of 11/30/15  5:35 PM. Always use your most recent med list.          Acetaminophen 325 MG Caps Take 650 mg by mouth every 6 (six) hours as needed.   amLODipine 5 MG tablet Commonly known as:  NORVASC Take 5 mg by mouth daily.   calcium carbonate 600 MG Tabs tablet Commonly known as:  OS-CAL Take 600 mg by mouth 2 (two) times daily with a meal.   ketoconazole 2 % shampoo Commonly known as:  NIZORAL Apply 1 application topically 2 (two) times a week. On bath days   levothyroxine 25 MCG tablet Commonly known as:  SYNTHROID, LEVOTHROID Take 25 mcg by mouth daily before breakfast.   sennosides-docusate sodium 8.6-50 MG tablet Commonly known as:  SENOKOT-S Take 1 tablet by mouth at bedtime.   UNABLE TO FIND Med Name: Med pass 120 mL daily   zinc oxide 11.3 % Crea cream Commonly known as:  BALMEX Apply 1 application topically. Every shift       Review of Systems  Unable to perform ROS: Dementia    Immunization History  Administered Date(s) Administered  . Influenza-Unspecified 02/08/2014, 03/03/2015  . PPD Test 01/17/2014, 01/10/2015  . Pneumococcal-Unspecified 11/08/2009   Pertinent  Health Maintenance Due  Topic Date  Due  . PNA vac Low Risk Adult (2 of 2 - PCV13) 07/01/2016 (Originally 11/09/2010)  . INFLUENZA VACCINE  12/05/2015  . DEXA SCAN  Completed      Vitals:   11/30/15 1725  BP: (!) 159/53  Resp: 16  SpO2: 94%  Weight: 122 lb 3.2 oz (55.4 kg)  Height: 5' (1.524 m)   Body mass index is 23.87 kg/m. Physical Exam  Constitutional:   Elderly in no acute distress.   HENT:  Head: Normocephalic.  Mouth/Throat: Oropharynx is clear and moist.  Eyes: Conjunctivae and EOM are normal. Pupils are equal, round, and reactive to light. Right eye exhibits no discharge. Left eye exhibits no discharge. No scleral icterus.  Neck: Normal range of motion. No JVD present.  No thyromegaly present.  Cardiovascular: Normal rate, regular rhythm, normal heart sounds and intact distal pulses.  Exam reveals no gallop and no friction rub.   No murmur heard. Pulmonary/Chest: Effort normal and breath sounds normal. No respiratory distress. She has no wheezes. She has no rales.  Abdominal: Soft. Bowel sounds are normal. She exhibits no distension. There is no tenderness. There is no rebound and no guarding.  LBM 11/30/2015 per facility NA   Genitourinary:  Genitourinary Comments: Incontinent for B/B   Musculoskeletal: Normal range of motion. She exhibits no edema or tenderness.  Bed bound   Lymphadenopathy:    She has no cervical adenopathy.  Neurological: She is alert.  Skin: Skin is warm and dry. No rash noted. No erythema. No pallor.  Psychiatric: She has a normal mood and affect.    Labs reviewed:  Recent Labs  05/29/15 06/23/15 07/11/15  NA 141 140 139  K 4.0 4.2 4.2  BUN 24* 19 32*  CREATININE 0.9 1.0 0.9    Recent Labs  05/29/15 06/23/15  AST 20 18  ALT 9 7  ALKPHOS 62 67    Recent Labs  02/02/15 05/29/15 06/23/15  WBC 11.1 9.7 10.3  HGB 14.4 15.2 14.8  HCT 44 46 45  PLT 236 298 237   Lab Results  Component Value Date   TSH 2.50 07/11/2015   Assessment/Plan 1. Essential hypertension, benign B/p stable. Continue on Amlodipine 5 mg tablet. Continue to monitor BMP.   2. Chronic obstructive pulmonary disease, unspecified COPD type (HCC) Stable. Exam findings negative for cough or wheezing. Continue to monitor.   3. Slow transit constipation Current regimen effective. LBM 11/30/2015 per facility NA   4. Hypothyroidism, unspecified hypothyroidism type Continue on levothyroxine 25 mcg Tablet. Monitor TSH periodically.   5. Primary osteoarthritis involving multiple joints Continue tylenol as needed. Continue to assist with ADL's. Fall and safety precautions.     Family/ staff Communication: Reviewed plan with patient and facility Nurse  supervisor.   Labs/tests ordered:  None

## 2016-01-11 DIAGNOSIS — E58 Dietary calcium deficiency: Secondary | ICD-10-CM | POA: Diagnosis not present

## 2016-01-11 DIAGNOSIS — I158 Other secondary hypertension: Secondary | ICD-10-CM | POA: Diagnosis not present

## 2016-01-11 DIAGNOSIS — E559 Vitamin D deficiency, unspecified: Secondary | ICD-10-CM | POA: Diagnosis not present

## 2016-01-11 LAB — BASIC METABOLIC PANEL
BUN: 32 mg/dL — AB (ref 4–21)
CREATININE: 1 mg/dL (ref 0.5–1.1)
GLUCOSE: 115 mg/dL
Potassium: 4.5 mmol/L (ref 3.4–5.3)
Sodium: 139 mmol/L (ref 137–147)

## 2016-01-29 ENCOUNTER — Non-Acute Institutional Stay (SKILLED_NURSING_FACILITY): Payer: Medicare Other | Admitting: Family

## 2016-01-29 ENCOUNTER — Encounter: Payer: Self-pay | Admitting: Family

## 2016-01-29 DIAGNOSIS — K5901 Slow transit constipation: Secondary | ICD-10-CM

## 2016-01-29 DIAGNOSIS — E039 Hypothyroidism, unspecified: Secondary | ICD-10-CM | POA: Diagnosis not present

## 2016-01-29 DIAGNOSIS — F0390 Unspecified dementia without behavioral disturbance: Secondary | ICD-10-CM

## 2016-01-29 DIAGNOSIS — M150 Primary generalized (osteo)arthritis: Secondary | ICD-10-CM | POA: Diagnosis not present

## 2016-01-29 DIAGNOSIS — I10 Essential (primary) hypertension: Secondary | ICD-10-CM

## 2016-01-29 DIAGNOSIS — M159 Polyosteoarthritis, unspecified: Secondary | ICD-10-CM

## 2016-01-29 NOTE — Progress Notes (Signed)
Location:  [facility] Room Number: 901A Place of Service:  SNF [phone]) Provider:  Richarda Blade, FNP-C  Oneal Grout, MD  Patient Care Team: Oneal Grout, MD as PCP - General (Internal Medicine) Sharee Holster, NP as Nurse Practitioner (Nurse Practitioner)  Extended Emergency Contact Information Primary Emergency Contact: Neese,Bill Address: [address]          [place], [place] [address] [place] Home Phone: [phone] Relation: Nephew  Code Status:  DNR Goals of care: Advanced Directive information Advanced Directives 01/29/2016  Does patient have an advance directive? Yes  Type of Advance Directive Out of facility DNR (pink MOST or yellow form)  Does patient want to make changes to advanced directive? -  Copy of advanced directive(s) in chart? Yes     Chief Complaint  Patient presents with  . Medical Management of Chronic Issues    routine    HPI:  Pt is a 82 y.o. female seen today at [facility] for medical management of chronic diseases. She has a medical history of HTN, Hypothyroidism, Depression, Dementia among other conditions. She is seen in her room today. She is pleasantly confused at baseline unable to provider any HPI and ROS. Facility Nurse reports no recent fall episodes or hospital admission.    Past Medical History:  Diagnosis Date  . Chronic kidney disease   . Chronic pain   . COLONIC POLYPS, HX OF 01/01/2008   Qualifier: Diagnosis of  By: Andrey Campanile MD, Raliegh Ip    . Dementia   . DIVERTICULITIS, HX OF 01/01/2008   Qualifier: Diagnosis of  By: Andrey Campanile MD, Raliegh Ip    . GASTRIC ULCER 01/01/2008   Qualifier: Diagnosis of  By: Andrey Campanile MD, Raliegh Ip    . GERD (gastroesophageal reflux disease)   . HCAP (healthcare-associated pneumonia) 05/17/2013  . Hypertension   . Thyroid disease   . Vitamin D deficiency    History reviewed. No pertinent surgical history.  Allergies  Allergen Reactions   . Sulfonamide Derivatives       Medication List       Accurate as of 01/29/16  1:02 PM. Always use your most recent med list.          Acetaminophen 325 MG Caps Take 650 mg by mouth every 6 (six) hours as needed.   amLODipine 5 MG tablet Commonly known as:  NORVASC Take 5 mg by mouth daily.   calcium carbonate 600 MG Tabs tablet Commonly known as:  OS-CAL Take 600 mg by mouth 2 (two) times daily with a meal.   ketoconazole 2 % shampoo Commonly known as:  NIZORAL Apply 1 application topically 2 (two) times a week. On bath days   levothyroxine 25 MCG tablet Commonly known as:  SYNTHROID, LEVOTHROID Take 25 mcg by mouth daily before breakfast.   sennosides-docusate sodium 8.6-50 MG tablet Commonly known as:  SENOKOT-S Take 1 tablet by mouth at bedtime.   UNABLE TO FIND Med Name: Med pass 120 mL daily   zinc oxide 11.3 % Crea cream Commonly known as:  BALMEX Apply 1 application topically. Every shift       Review of Systems  Unable to perform ROS: Dementia    Immunization History  Administered Date(s) Administered  . Influenza-Unspecified 02/08/2014, 03/03/2015  . PPD Test 01/17/2014, 01/10/2015  . Pneumococcal-Unspecified 11/08/2009   Pertinent  Health Maintenance Due  Topic Date Due  . INFLUENZA VACCINE  12/05/2015  . PNA vac Low  Risk Adult (2 of 2 - PCV13) 07/01/2016 (Originally 11/09/2010)  . DEXA SCAN  Completed    Vitals:   01/29/16 1252  BP: (!) 145/85  Pulse: 60  Resp: 16  Temp: 97.6 F (36.4 C)  SpO2: 95%  Weight: 121 lb (54.9 kg)  Height: 5' (1.524 m)   Body mass index is 23.63 kg/m. Physical Exam  Constitutional:   Elderly pleasantly confused at baseline in no acute distress.  HENT:  Head: Normocephalic.  Mouth/Throat: Oropharynx is clear and moist.  Eyes: Conjunctivae and EOM are normal. Pupils are equal, round, and reactive to light. Right eye exhibits no discharge. Left eye exhibits no discharge. No scleral icterus.  Neck:  Normal range of motion. No JVD present. No thyromegaly present.  Cardiovascular: Normal rate, regular rhythm, normal heart sounds and intact distal pulses.  Exam reveals no gallop and no friction rub.   No murmur heard. Pulmonary/Chest: Effort normal and breath sounds normal. No respiratory distress. She has no wheezes. She has no rales.  Abdominal: Soft. Bowel sounds are normal. She exhibits no distension. There is no tenderness. There is no rebound and no guarding.  LBM 01/29/2016 per facility NA   Genitourinary:  Genitourinary Comments: Incontinent for B/B   Musculoskeletal: Normal range of motion. She exhibits no edema or tenderness.  Bed bound   Lymphadenopathy:    She has no cervical adenopathy.  Neurological: She is alert.  Skin: Skin is warm and dry. No rash noted. No erythema. No pallor.  Skin intact  Psychiatric: She has a normal mood and affect.    Labs reviewed:  Recent Labs  05/29/15 06/23/15 07/11/15  NA 141 140 139  K 4.0 4.2 4.2  BUN 24* 19 32*  CREATININE 0.9 1.0 0.9    Recent Labs  05/29/15 06/23/15  AST 20 18  ALT 9 7  ALKPHOS 62 67    Recent Labs  02/02/15 05/29/15 06/23/15  WBC 11.1 9.7 10.3  HGB 14.4 15.2 14.8  HCT 44 46 45  PLT 236 298 237   Lab Results  Component Value Date   TSH 2.50 07/11/2015   Assessment/Plan HTN B/p stable. Continue on Amlodipine 5 mg Tablet. CBC,BMP 01/30/2016    Hypothyroidism Continue on Levothyroxine 25 mcg tablet. Check TSH level 01/30/2016    Constipation  Current regimen effective. Continue to encourage oral intake and rehydration.   OA  Continue on Tylenol as needed.  Dementia  Pleasantly confused at baseline.No new behavioral concerns reported.Continue to assist with ADL's. Fall and safety precautions.   Family/ staff Communication: Reviewed plan of care with patient and  facility Nurse supervisor.  Labs/tests ordered: CBC, BMP,TSH level   01/30/2016

## 2016-01-30 DIAGNOSIS — E038 Other specified hypothyroidism: Secondary | ICD-10-CM | POA: Diagnosis not present

## 2016-01-30 LAB — TSH: TSH: 4.26 u[IU]/mL (ref 0.41–5.90)

## 2016-01-30 LAB — BASIC METABOLIC PANEL
BUN: 23 mg/dL — AB (ref 4–21)
CREATININE: 0.8 mg/dL (ref 0.5–1.1)
GLUCOSE: 94 mg/dL
POTASSIUM: 4.4 mmol/L (ref 3.4–5.3)
SODIUM: 138 mmol/L (ref 137–147)

## 2016-01-30 LAB — CBC AND DIFFERENTIAL
HCT: 42 % (ref 36–46)
Hemoglobin: 14 g/dL (ref 12.0–16.0)
Platelets: 193 10*3/uL (ref 150–399)
WBC: 7.6 10^3/mL

## 2016-02-01 DIAGNOSIS — B351 Tinea unguium: Secondary | ICD-10-CM | POA: Diagnosis not present

## 2016-02-01 DIAGNOSIS — I70203 Unspecified atherosclerosis of native arteries of extremities, bilateral legs: Secondary | ICD-10-CM | POA: Diagnosis not present

## 2016-02-01 DIAGNOSIS — M79675 Pain in left toe(s): Secondary | ICD-10-CM | POA: Diagnosis not present

## 2016-02-01 DIAGNOSIS — M79674 Pain in right toe(s): Secondary | ICD-10-CM | POA: Diagnosis not present

## 2016-02-27 ENCOUNTER — Non-Acute Institutional Stay (SKILLED_NURSING_FACILITY): Payer: Medicare Other | Admitting: Family

## 2016-02-27 DIAGNOSIS — M159 Polyosteoarthritis, unspecified: Secondary | ICD-10-CM

## 2016-02-27 DIAGNOSIS — J449 Chronic obstructive pulmonary disease, unspecified: Secondary | ICD-10-CM | POA: Diagnosis not present

## 2016-02-27 DIAGNOSIS — K5901 Slow transit constipation: Secondary | ICD-10-CM | POA: Diagnosis not present

## 2016-02-27 DIAGNOSIS — M150 Primary generalized (osteo)arthritis: Secondary | ICD-10-CM

## 2016-02-27 DIAGNOSIS — F0390 Unspecified dementia without behavioral disturbance: Secondary | ICD-10-CM | POA: Diagnosis not present

## 2016-02-27 DIAGNOSIS — E039 Hypothyroidism, unspecified: Secondary | ICD-10-CM

## 2016-02-27 DIAGNOSIS — I10 Essential (primary) hypertension: Secondary | ICD-10-CM

## 2016-02-27 NOTE — Progress Notes (Signed)
Location:  [facility] Room Number: 901A  Place of Service:  SNF [phone]) Provider: Zaia Carre FNP-C   Oneal Grout, MD  Patient Care Team: Oneal Grout, MD as PCP - General (Internal Medicine) Sharee Holster, NP as Nurse Practitioner (Nurse Practitioner)  Extended Emergency Contact Information Primary Emergency Contact: Neese,Bill Address: [address]          [place], [place] [address] [place] Home Phone: [phone] Relation: Nephew  Code Status: DNR  Goals of care: Advanced Directive information Advanced Directives 01/29/2016  Does patient have an advance directive? Yes  Type of Advance Directive Out of facility DNR (pink MOST or yellow form)  Does patient want to make changes to advanced directive? -  Copy of advanced directive(s) in chart? Yes     Chief Complaint  Patient presents with  . Medical Management of Chronic Issues    HPI:  Pt is a 82 y.o. female seen today at [facility]  for medical management of chronic diseases. She has a medical history of dementia, HTN, COPD, Hypothyroidism, Depression, Glaucoma among other conditions. She is seen in her room today.No recent fall episodes, UTI symptoms or hospital admission reported. She has had one pound weight loss over one month. Currently on protein supplements.    Past Medical History:  Diagnosis Date  . Chronic kidney disease   . Chronic pain   . COLONIC POLYPS, HX OF 01/01/2008   Qualifier: Diagnosis of  By: Andrey Campanile MD, Raliegh Ip    . Dementia   . DIVERTICULITIS, HX OF 01/01/2008   Qualifier: Diagnosis of  By: Andrey Campanile MD, Raliegh Ip    . GASTRIC ULCER 01/01/2008   Qualifier: Diagnosis of  By: Andrey Campanile MD, Raliegh Ip    . GERD (gastroesophageal reflux disease)   . HCAP (healthcare-associated pneumonia) 05/17/2013  . Hypertension   . Thyroid disease   . Vitamin D deficiency    No past surgical history on file.  Allergies  Allergen Reactions    . Sulfonamide Derivatives       Medication List       Accurate as of 02/27/16  4:13 PM. Always use your most recent med list.          Acetaminophen 325 MG Caps Take 650 mg by mouth every 6 (six) hours as needed.   amLODipine 5 MG tablet Commonly known as:  NORVASC Take 5 mg by mouth daily.   calcium carbonate 600 MG Tabs tablet Commonly known as:  OS-CAL Take 600 mg by mouth 2 (two) times daily with a meal.   ketoconazole 2 % shampoo Commonly known as:  NIZORAL Apply 1 application topically 2 (two) times a week. On bath days   levothyroxine 25 MCG tablet Commonly known as:  SYNTHROID, LEVOTHROID Take 25 mcg by mouth daily before breakfast.   sennosides-docusate sodium 8.6-50 MG tablet Commonly known as:  SENOKOT-S Take 1 tablet by mouth at bedtime.   UNABLE TO FIND Med Name: Med pass 120 mL daily   zinc oxide 11.3 % Crea cream Commonly known as:  BALMEX Apply 1 application topically. Every shift       Review of Systems  Unable to perform ROS: Dementia    Immunization History  Administered Date(s) Administered  . Influenza-Unspecified 02/08/2014, 03/03/2015  . PPD Test 01/17/2014, 01/10/2015  . Pneumococcal-Unspecified 11/08/2009   Pertinent  Health Maintenance Due  Topic Date Due  . INFLUENZA VACCINE  12/05/2015  . PNA vac  Low Risk Adult (2 of 2 - PCV13) 07/01/2016 (Originally 11/09/2010)  . DEXA SCAN  Completed      Vitals:   02/27/16 1100  BP: 129/68  Pulse: 80  Resp: 16  Temp: 97.6 F (36.4 C)  SpO2: 98%  Weight: 119 lb (54 kg)  Height: 5' (1.524 m)   Body mass index is 23.24 kg/m. Physical Exam  Constitutional:  confused at baseline in no acute distress.  HENT:  Head: Normocephalic.  Mouth/Throat: Oropharynx is clear and moist.  Eyes: Conjunctivae and EOM are normal. Pupils are equal, round, and reactive to light. Right eye exhibits no discharge. Left eye exhibits no discharge. No scleral icterus.  Neck: Normal range of motion.  No JVD present. No thyromegaly present.  Cardiovascular: Normal rate, regular rhythm, normal heart sounds and intact distal pulses.  Exam reveals no gallop and no friction rub.   No murmur heard. Pulmonary/Chest: Effort normal and breath sounds normal. No respiratory distress. She has no wheezes. She has no rales.  Abdominal: Soft. Bowel sounds are normal. She exhibits no distension. There is no tenderness. There is no rebound and no guarding.  Genitourinary:  Genitourinary Comments: Incontinent for B/B   Musculoskeletal: Normal range of motion. She exhibits no edema or tenderness.  Bed bound   Lymphadenopathy:    She has no cervical adenopathy.  Neurological: She is alert.  Pleasantly confused   Skin: Skin is warm and dry. No rash noted. No erythema. No pallor.  Skin intact  Psychiatric: She has a normal mood and affect.    Labs reviewed:  Recent Labs  05/29/15 06/23/15 07/11/15  NA 141 140 139  K 4.0 4.2 4.2  BUN 24* 19 32*  CREATININE 0.9 1.0 0.9    Recent Labs  05/29/15 06/23/15  AST 20 18  ALT 9 7  ALKPHOS 62 67    Recent Labs  05/29/15 06/23/15  WBC 9.7 10.3  HGB 15.2 14.8  HCT 46 45  PLT 298 237   Lab Results  Component Value Date   TSH 2.50 07/11/2015   Assessment/Plan 1. Chronic obstructive pulmonary disease, unspecified COPD type (HCC) Stable. Exam findings negative for shortness of breath or wheezes.   2. Essential hypertension, benign B/p stable. Continue on Amlodipine.  BMP 02/28/2016  3. Hypothyroidism, unspecified type Continue on levothyroxine. TSH level 10/25/ 2017.   4. Slow transit constipation Current regimen effective. Continue to encourage hydration.   5. Primary osteoarthritis involving multiple joints Continue on tylenol.   6. Dementia without behavioral disturbance, unspecified dementia type No new behavioral issues reported. Continue to assist with ADL's. Reposition frequently to prevent skin breakdown. Encourage oral intake.      Family/ staff Communication: Reviewed plan of care with facility Nurse supervisor.   Labs/tests ordered:  CBC, BMP, TSH level 02/28/2016.

## 2016-02-28 DIAGNOSIS — E039 Hypothyroidism, unspecified: Secondary | ICD-10-CM | POA: Diagnosis not present

## 2016-02-28 LAB — CBC AND DIFFERENTIAL
HCT: 45 % (ref 36–46)
Hemoglobin: 15.3 g/dL (ref 12.0–16.0)
Platelets: 229 10*3/uL (ref 150–399)
WBC: 9.5 10^3/mL

## 2016-02-28 LAB — BASIC METABOLIC PANEL
BUN: 24 mg/dL — AB (ref 4–21)
CREATININE: 1 mg/dL (ref 0.5–1.1)
GLUCOSE: 93 mg/dL
Potassium: 4.3 mmol/L (ref 3.4–5.3)
SODIUM: 140 mmol/L (ref 137–147)

## 2016-02-28 LAB — TSH: TSH: 4.21 u[IU]/mL (ref 0.41–5.90)

## 2016-03-11 ENCOUNTER — Emergency Department (HOSPITAL_COMMUNITY): Payer: Medicare Other

## 2016-03-11 ENCOUNTER — Inpatient Hospital Stay (HOSPITAL_COMMUNITY)
Admission: EM | Admit: 2016-03-11 | Discharge: 2016-03-14 | DRG: 064 | Disposition: A | Discharge status: Skilled Nursing Facility | Payer: Medicare Other | Attending: Internal Medicine | Admitting: Internal Medicine

## 2016-03-11 ENCOUNTER — Encounter (HOSPITAL_COMMUNITY): Payer: Self-pay | Admitting: Emergency Medicine

## 2016-03-11 DIAGNOSIS — R4701 Aphasia: Secondary | ICD-10-CM | POA: Diagnosis not present

## 2016-03-11 DIAGNOSIS — I129 Hypertensive chronic kidney disease with stage 1 through stage 4 chronic kidney disease, or unspecified chronic kidney disease: Secondary | ICD-10-CM | POA: Diagnosis present

## 2016-03-11 DIAGNOSIS — R001 Bradycardia, unspecified: Secondary | ICD-10-CM | POA: Diagnosis present

## 2016-03-11 DIAGNOSIS — N183 Chronic kidney disease, stage 3 (moderate): Secondary | ICD-10-CM | POA: Diagnosis present

## 2016-03-11 DIAGNOSIS — I6789 Other cerebrovascular disease: Secondary | ICD-10-CM | POA: Diagnosis not present

## 2016-03-11 DIAGNOSIS — I639 Cerebral infarction, unspecified: Secondary | ICD-10-CM

## 2016-03-11 DIAGNOSIS — E039 Hypothyroidism, unspecified: Secondary | ICD-10-CM | POA: Diagnosis not present

## 2016-03-11 DIAGNOSIS — R29704 NIHSS score 4: Secondary | ICD-10-CM | POA: Diagnosis not present

## 2016-03-11 DIAGNOSIS — Z8711 Personal history of peptic ulcer disease: Secondary | ICD-10-CM | POA: Diagnosis not present

## 2016-03-11 DIAGNOSIS — I6523 Occlusion and stenosis of bilateral carotid arteries: Secondary | ICD-10-CM | POA: Diagnosis present

## 2016-03-11 DIAGNOSIS — I63512 Cerebral infarction due to unspecified occlusion or stenosis of left middle cerebral artery: Principal | ICD-10-CM | POA: Diagnosis present

## 2016-03-11 DIAGNOSIS — N3000 Acute cystitis without hematuria: Secondary | ICD-10-CM | POA: Diagnosis not present

## 2016-03-11 DIAGNOSIS — F0390 Unspecified dementia without behavioral disturbance: Secondary | ICD-10-CM | POA: Diagnosis present

## 2016-03-11 DIAGNOSIS — I69398 Other sequelae of cerebral infarction: Secondary | ICD-10-CM | POA: Diagnosis not present

## 2016-03-11 DIAGNOSIS — Z66 Do not resuscitate: Secondary | ICD-10-CM | POA: Diagnosis present

## 2016-03-11 DIAGNOSIS — G9341 Metabolic encephalopathy: Secondary | ICD-10-CM | POA: Diagnosis present

## 2016-03-11 DIAGNOSIS — Z882 Allergy status to sulfonamides status: Secondary | ICD-10-CM

## 2016-03-11 DIAGNOSIS — E785 Hyperlipidemia, unspecified: Secondary | ICD-10-CM | POA: Diagnosis present

## 2016-03-11 DIAGNOSIS — I69391 Dysphagia following cerebral infarction: Secondary | ICD-10-CM | POA: Diagnosis not present

## 2016-03-11 DIAGNOSIS — R29700 NIHSS score 0: Secondary | ICD-10-CM | POA: Diagnosis present

## 2016-03-11 DIAGNOSIS — R0789 Other chest pain: Secondary | ICD-10-CM | POA: Diagnosis not present

## 2016-03-11 DIAGNOSIS — R4182 Altered mental status, unspecified: Secondary | ICD-10-CM | POA: Diagnosis not present

## 2016-03-11 DIAGNOSIS — I69322 Dysarthria following cerebral infarction: Secondary | ICD-10-CM | POA: Diagnosis not present

## 2016-03-11 DIAGNOSIS — G8321 Monoplegia of upper limb affecting right dominant side: Secondary | ICD-10-CM | POA: Diagnosis present

## 2016-03-11 DIAGNOSIS — R29810 Facial weakness: Secondary | ICD-10-CM | POA: Diagnosis present

## 2016-03-11 DIAGNOSIS — R079 Chest pain, unspecified: Secondary | ICD-10-CM

## 2016-03-11 DIAGNOSIS — F05 Delirium due to known physiological condition: Secondary | ICD-10-CM | POA: Diagnosis present

## 2016-03-11 DIAGNOSIS — G3183 Dementia with Lewy bodies: Secondary | ICD-10-CM | POA: Diagnosis not present

## 2016-03-11 DIAGNOSIS — M6281 Muscle weakness (generalized): Secondary | ICD-10-CM | POA: Diagnosis not present

## 2016-03-11 DIAGNOSIS — I6522 Occlusion and stenosis of left carotid artery: Secondary | ICD-10-CM | POA: Diagnosis not present

## 2016-03-11 DIAGNOSIS — K219 Gastro-esophageal reflux disease without esophagitis: Secondary | ICD-10-CM | POA: Diagnosis present

## 2016-03-11 DIAGNOSIS — R410 Disorientation, unspecified: Secondary | ICD-10-CM | POA: Diagnosis not present

## 2016-03-11 DIAGNOSIS — R278 Other lack of coordination: Secondary | ICD-10-CM | POA: Diagnosis not present

## 2016-03-11 DIAGNOSIS — I63232 Cerebral infarction due to unspecified occlusion or stenosis of left carotid arteries: Secondary | ICD-10-CM | POA: Diagnosis not present

## 2016-03-11 DIAGNOSIS — R4781 Slurred speech: Secondary | ICD-10-CM | POA: Diagnosis not present

## 2016-03-11 DIAGNOSIS — I10 Essential (primary) hypertension: Secondary | ICD-10-CM | POA: Diagnosis present

## 2016-03-11 DIAGNOSIS — I69320 Aphasia following cerebral infarction: Secondary | ICD-10-CM | POA: Diagnosis not present

## 2016-03-11 LAB — URINE MICROSCOPIC-ADD ON

## 2016-03-11 LAB — I-STAT CHEM 8, ED
BUN: 46 mg/dL — AB (ref 6–20)
CALCIUM ION: 1.09 mmol/L — AB (ref 1.15–1.40)
CHLORIDE: 104 mmol/L (ref 101–111)
CREATININE: 0.9 mg/dL (ref 0.44–1.00)
GLUCOSE: 103 mg/dL — AB (ref 65–99)
HCT: 48 % — ABNORMAL HIGH (ref 36.0–46.0)
Hemoglobin: 16.3 g/dL — ABNORMAL HIGH (ref 12.0–15.0)
POTASSIUM: 7.5 mmol/L — AB (ref 3.5–5.1)
Sodium: 134 mmol/L — ABNORMAL LOW (ref 135–145)
TCO2: 29 mmol/L (ref 0–100)

## 2016-03-11 LAB — CBC
HCT: 44.9 % (ref 36.0–46.0)
HEMOGLOBIN: 15.3 g/dL — AB (ref 12.0–15.0)
MCH: 30.7 pg (ref 26.0–34.0)
MCHC: 34.1 g/dL (ref 30.0–36.0)
MCV: 90 fL (ref 78.0–100.0)
PLATELETS: 239 10*3/uL (ref 150–400)
RBC: 4.99 MIL/uL (ref 3.87–5.11)
RDW: 13.7 % (ref 11.5–15.5)
WBC: 13.3 10*3/uL — AB (ref 4.0–10.5)

## 2016-03-11 LAB — COMPREHENSIVE METABOLIC PANEL
ALBUMIN: 3.4 g/dL — AB (ref 3.5–5.0)
ALK PHOS: 82 U/L (ref 38–126)
ALT: 17 U/L (ref 14–54)
ANION GAP: 8 (ref 5–15)
AST: 49 U/L — ABNORMAL HIGH (ref 15–41)
BILIRUBIN TOTAL: 1.4 mg/dL — AB (ref 0.3–1.2)
BUN: 26 mg/dL — ABNORMAL HIGH (ref 6–20)
CALCIUM: 9.2 mg/dL (ref 8.9–10.3)
CO2: 22 mmol/L (ref 22–32)
CREATININE: 0.96 mg/dL (ref 0.44–1.00)
Chloride: 102 mmol/L (ref 101–111)
GFR calc Af Amer: 54 mL/min — ABNORMAL LOW (ref >60)
GFR calc non Af Amer: 47 mL/min — ABNORMAL LOW (ref >60)
GLUCOSE: 98 mg/dL (ref 65–99)
Potassium: 7.5 mmol/L (ref 3.5–5.1)
SODIUM: 132 mmol/L — AB (ref 135–145)
TOTAL PROTEIN: 6.8 g/dL (ref 6.5–8.1)

## 2016-03-11 LAB — URINALYSIS, ROUTINE W REFLEX MICROSCOPIC
Bilirubin Urine: NEGATIVE
GLUCOSE, UA: NEGATIVE mg/dL
Ketones, ur: NEGATIVE mg/dL
Nitrite: NEGATIVE
PH: 7.5 (ref 5.0–8.0)
SPECIFIC GRAVITY, URINE: 1.019 (ref 1.005–1.030)

## 2016-03-11 LAB — DIFFERENTIAL
Basophils Absolute: 0.1 10*3/uL (ref 0.0–0.1)
Basophils Relative: 1 %
EOS PCT: 2 %
Eosinophils Absolute: 0.2 10*3/uL (ref 0.0–0.7)
LYMPHS ABS: 1.8 10*3/uL (ref 0.7–4.0)
LYMPHS PCT: 13 %
MONO ABS: 0.9 10*3/uL (ref 0.1–1.0)
Monocytes Relative: 7 %
NEUTROS ABS: 10.4 10*3/uL — AB (ref 1.7–7.7)
NEUTROS PCT: 77 %

## 2016-03-11 LAB — APTT: aPTT: 32 seconds (ref 24–36)

## 2016-03-11 LAB — PROTIME-INR
INR: 1.02
Prothrombin Time: 13.4 seconds (ref 11.4–15.2)

## 2016-03-11 LAB — I-STAT TROPONIN, ED: Troponin i, poc: 0 ng/mL (ref 0.00–0.08)

## 2016-03-11 LAB — POTASSIUM: Potassium: 4 mmol/L (ref 3.5–5.1)

## 2016-03-11 MED ORDER — CEFTRIAXONE SODIUM 1 G IJ SOLR
1.0000 g | Freq: Once | INTRAMUSCULAR | Status: AC
  Administered 2016-03-12: 1 g via INTRAVENOUS
  Filled 2016-03-11: qty 10

## 2016-03-11 NOTE — ED Notes (Signed)
X-ray at bedside

## 2016-03-11 NOTE — ED Notes (Signed)
Patient transported to MRI 

## 2016-03-11 NOTE — ED Triage Notes (Signed)
Arrived via EMS from [facility].  Called for patient having slurred speech unknown last seen normal.  Patient alert history of dementia hold and hugs stuff animal dog.  CBG 113

## 2016-03-11 NOTE — ED Notes (Signed)
Pt back in room. No distress observed. 

## 2016-03-11 NOTE — ED Notes (Signed)
Patient transported to CT 

## 2016-03-11 NOTE — ED Provider Notes (Signed)
[facility]-EMERGENCY DEPT Provider Note   CSN: [id number] Arrival date & time: 03/11/16  1805     History   Chief Complaint Chief Complaint  Patient presents with  . Aphasia    HPI Shannon Gomez is a [age] y.o. female.  HPI 82 year old female with a history of dementia presents from nursing skilled facility for new onset of slurred speech, right facial droop, and right upper extremity weakness. They report that this was noted approximately 1600 today. However, they are unsure of onset or last known normal. Per EMS patient's right upper extremity weakness has improved.  Remainder of history, ROS, and physical exam limited due to patient's condition (dementia). Additional information was obtained from either EMS.   Level V Caveat.    Past Medical History:  Diagnosis Date  . Chronic kidney disease   . Chronic pain   . COLONIC POLYPS, HX OF 01/01/2008   Qualifier: Diagnosis of  By: Andrey CampanileWilson MD, Raliegh IpLauraLee    . Dementia   . DIVERTICULITIS, HX OF 01/01/2008   Qualifier: Diagnosis of  By: Andrey CampanileWilson MD, Raliegh IpLauraLee    . GASTRIC ULCER 01/01/2008   Qualifier: Diagnosis of  By: Andrey CampanileWilson MD, Raliegh IpLauraLee    . GERD (gastroesophageal reflux disease)   . HCAP (healthcare-associated pneumonia) 05/17/2013  . Hypertension   . Thyroid disease   . Vitamin D deficiency     Patient Active Problem List   Diagnosis Date Noted  . Seborrheic dermatitis of scalp 07/11/2015  . Bradycardia 06/22/2015  . COPD (chronic obstructive pulmonary disease) (HCC) 04/30/2015  . Vitamin D deficiency 04/30/2015  . Chronic pain 08/01/2014  . Senile osteoporosis 08/01/2014  . Dry mouth 08/01/2014  . Xerophthalmia 12/16/2013  . Essential hypertension, benign 11/29/2013  . FTT (failure to thrive) in adult 05/30/2013  . Dementia without behavioral disturbance 04/27/2013  . Edema 12/23/2012  . Constipation 12/23/2012  . Hypothyroidism 01/01/2008  . DEPRESSION 01/01/2008  . GLAUCOMA 01/01/2008  . CAD 01/01/2008  . ABNORMAL  HEART RHYTHMS 01/01/2008  . ALLERGIC RHINITIS 01/01/2008  . COPD 01/01/2008  . GERD 01/01/2008  . Osteoarthritis 01/01/2008  . INSOMNIA 01/01/2008    History reviewed. No pertinent surgical history.  OB History    No data available       Home Medications    Prior to Admission medications   Medication Sig Start Date End Date Taking? Authorizing Provider  Acetaminophen 325 MG CAPS Take 650 mg by mouth every 6 (six) hours as needed (pain).    Yes Historical Provider, MD  amLODipine (NORVASC) 5 MG tablet Take 5 mg by mouth daily.    Yes Historical Provider, MD  calcium carbonate (OS-CAL) 600 MG TABS tablet Take 600 mg by mouth 2 (two) times daily with a meal.   Yes Historical Provider, MD  ketoconazole (NIZORAL) 2 % shampoo Apply 1 application topically 2 (two) times a week. On bath days   Yes Historical Provider, MD  levothyroxine (SYNTHROID, LEVOTHROID) 25 MCG tablet Take 25 mcg by mouth daily before breakfast.    Yes Historical Provider, MD  OVER THE COUNTER MEDICATION Take 120 mLs by mouth daily. "medpass"-protein supplement given when taking meds   Yes Historical Provider, MD  sennosides-docusate sodium (SENOKOT-S) 8.6-50 MG tablet Take 1 tablet by mouth at bedtime.    Yes Historical Provider, MD  zinc oxide (BALMEX) 11.3 % CREA cream Apply 1 application topically. Every shift   Yes Historical Provider, MD  UNABLE TO FIND Med Name: Med pass 120 mL daily  Historical Provider, MD    Family History No family history on file.  Social History Social History  Substance Use Topics  . Smoking status: Never Smoker  . Smokeless tobacco: Not on file  . Alcohol use Not on file     Allergies   Sulfonamide derivatives   Review of Systems Review of Systems  Unable to perform ROS: Dementia     Physical Exam Updated Vital Signs BP (!) 134/42   Pulse 62   Temp 98 F (36.7 C)   Resp 18   Ht 5' (1.524 m)   Wt 120 lb (54.4 kg)   SpO2 98%   BMI 23.44 kg/m   Physical  Exam  Constitutional: She is oriented to person, place, and time. She appears well-developed and well-nourished. No distress.  HENT:  Head: Normocephalic and atraumatic.  Nose: Nose normal.  Eyes: Conjunctivae and EOM are normal. Pupils are equal, round, and reactive to light. Right eye exhibits no discharge. Left eye exhibits no discharge. No scleral icterus.  Neck: Normal range of motion. Neck supple.  Cardiovascular: Normal rate and regular rhythm.  Exam reveals no gallop and no friction rub.   No murmur heard. Pulmonary/Chest: Effort normal and breath sounds normal. No stridor. No respiratory distress. She has no rales.  Abdominal: Soft. She exhibits no distension. There is no tenderness.  Musculoskeletal: She exhibits no edema or tenderness.  Neurological: She is alert and oriented to person, place, and time.  Limited due to cooperation. Right facial droop. Slurred speech. Moving all extremities. 1+ reflexes throughout.  Skin: Skin is warm and dry. No rash noted. She is not diaphoretic. No erythema.  Psychiatric: She has a normal mood and affect.  Vitals reviewed.    ED Treatments / Results  Labs (all labs ordered are listed, but only abnormal results are displayed) Labs Reviewed  CBC - Abnormal; Notable for the following:       Result Value   WBC 13.3 (*)    Hemoglobin 15.3 (*)    All other components within normal limits  DIFFERENTIAL - Abnormal; Notable for the following:    Neutro Abs 10.4 (*)    All other components within normal limits  COMPREHENSIVE METABOLIC PANEL - Abnormal; Notable for the following:    Sodium 132 (*)    Potassium 7.5 (*)    BUN 26 (*)    Albumin 3.4 (*)    AST 49 (*)    Total Bilirubin 1.4 (*)    GFR calc non Af Amer 47 (*)    GFR calc Af Amer 54 (*)    All other components within normal limits  URINALYSIS, ROUTINE W REFLEX MICROSCOPIC (NOT AT [facility]) - Abnormal; Notable for the following:    APPearance CLOUDY (*)    Hgb urine dipstick  MODERATE (*)    Protein, ur >300 (*)    Leukocytes, UA LARGE (*)    All other components within normal limits  URINE MICROSCOPIC-ADD ON - Abnormal; Notable for the following:    Squamous Epithelial / LPF 0-5 (*)    Bacteria, UA MANY (*)    All other components within normal limits  I-STAT CHEM 8, ED - Abnormal; Notable for the following:    Sodium 134 (*)    Potassium 7.5 (*)    BUN 46 (*)    Glucose, Bld 103 (*)    Calcium, Ion 1.09 (*)    Hemoglobin 16.3 (*)    HCT 48.0 (*)    All other  components within normal limits  PROTIME-INR  APTT  POTASSIUM  I-STAT TROPOININ, ED  CBG MONITORING, ED    EKG  EKG Interpretation  Date/Time:  Monday March 11 2016 18:22:41 EST Ventricular Rate:  62 PR Interval:    QRS Duration: 139 QT Interval:  470 QTC Calculation: 478 R Axis:   116 Text Interpretation:  Sinus rhythm Right bundle branch block Anteroseptal infarct, age indeterminate No significant change since last tracing Confirmed by [facility] MD, Richard Ritchey [phone] on 03/11/2016 6:32:35 PM       Radiology Dg Chest Port 1 View  Result Date: 03/11/2016 CLINICAL DATA:  Slurred speech history of dementia EXAM: PORTABLE CHEST 1 VIEW COMPARISON:  08/19/2009 FINDINGS: Semi-erect portable view chest demonstrates low lung volumes and elevation of the right diaphragm. There is no focal infiltrate or effusion. Cardiomediastinal silhouette stable with borderline enlargement of the heart size. Atherosclerosis. No pneumothorax. IMPRESSION: 1. Borderline to mild cardiomegaly without overt failure 2. Atherosclerosis of the aorta 3. Low lung volumes.  No focal infiltrate Electronically Signed   By: Jasmine PangKim  Fujinaga M.D.   On: 03/11/2016 20:49    Procedures Procedures (including critical care time)  Medications Ordered in ED Medications - No data to display   Initial Impression / Assessment and Plan / ED Course  I have reviewed the triage vital signs and the nursing notes.  Pertinent labs & imaging  results that were available during my care of the patient were reviewed by me and considered in my medical decision making (see chart for details).  Clinical Course     Out of the window for code stroke. CT head without evidence of intracranial hemorrhage. MRI confirming left MCA infarct. Labs with notable for leukocytosis. UA concerning for infection. IV Rocephin given.  Neurology consulted who will follow the patient. Appreciate hospitalist admission for further management.  Final Clinical Impressions(s) / ED Diagnoses   Final diagnoses:  Cerebral infarction, unspecified mechanism (HCC)  Acute cystitis without hematuria    Disposition: Admit  Condition: Stable    Nira ConnPedro Eduardo Danelia Snodgrass, MD 03/12/16 0011

## 2016-03-12 ENCOUNTER — Inpatient Hospital Stay (HOSPITAL_COMMUNITY): Payer: Medicare Other

## 2016-03-12 ENCOUNTER — Encounter (HOSPITAL_COMMUNITY): Payer: Medicare Other

## 2016-03-12 ENCOUNTER — Encounter (HOSPITAL_COMMUNITY): Payer: Self-pay | Admitting: Radiology

## 2016-03-12 DIAGNOSIS — I639 Cerebral infarction, unspecified: Secondary | ICD-10-CM | POA: Diagnosis present

## 2016-03-12 DIAGNOSIS — N3000 Acute cystitis without hematuria: Secondary | ICD-10-CM

## 2016-03-12 DIAGNOSIS — G3183 Dementia with Lewy bodies: Secondary | ICD-10-CM

## 2016-03-12 DIAGNOSIS — I63512 Cerebral infarction due to unspecified occlusion or stenosis of left middle cerebral artery: Principal | ICD-10-CM

## 2016-03-12 DIAGNOSIS — F0390 Unspecified dementia without behavioral disturbance: Secondary | ICD-10-CM

## 2016-03-12 DIAGNOSIS — E039 Hypothyroidism, unspecified: Secondary | ICD-10-CM

## 2016-03-12 DIAGNOSIS — F0280 Dementia in other diseases classified elsewhere without behavioral disturbance: Secondary | ICD-10-CM

## 2016-03-12 DIAGNOSIS — I6523 Occlusion and stenosis of bilateral carotid arteries: Secondary | ICD-10-CM

## 2016-03-12 DIAGNOSIS — I10 Essential (primary) hypertension: Secondary | ICD-10-CM

## 2016-03-12 LAB — BASIC METABOLIC PANEL
ANION GAP: 10 (ref 5–15)
ANION GAP: 10 (ref 5–15)
BUN: 24 mg/dL — AB (ref 6–20)
BUN: 24 mg/dL — ABNORMAL HIGH (ref 6–20)
CALCIUM: 9 mg/dL (ref 8.9–10.3)
CALCIUM: 9 mg/dL (ref 8.9–10.3)
CHLORIDE: 103 mmol/L (ref 101–111)
CO2: 23 mmol/L (ref 22–32)
CO2: 22 mmol/L (ref 22–32)
Chloride: 104 mmol/L (ref 101–111)
Creatinine, Ser: 0.87 mg/dL (ref 0.44–1.00)
Creatinine, Ser: 0.98 mg/dL (ref 0.44–1.00)
GFR calc Af Amer: >60 mL/min (ref >60)
GFR calc non Af Amer: 46 mL/min — ABNORMAL LOW (ref >60)
GFR, EST AFRICAN AMERICAN: 53 mL/min — AB (ref >60)
GFR, EST NON AFRICAN AMERICAN: 53 mL/min — AB (ref >60)
GLUCOSE: 126 mg/dL — AB (ref 65–99)
Glucose, Bld: 96 mg/dL (ref 65–99)
POTASSIUM: 4.2 mmol/L (ref 3.5–5.1)
POTASSIUM: 4.3 mmol/L (ref 3.5–5.1)
SODIUM: 137 mmol/L (ref 135–145)
Sodium: 135 mmol/L (ref 135–145)

## 2016-03-12 LAB — CBC
HEMATOCRIT: 41 % (ref 36.0–46.0)
HEMOGLOBIN: 13.9 g/dL (ref 12.0–15.0)
MCH: 30.5 pg (ref 26.0–34.0)
MCHC: 33.9 g/dL (ref 30.0–36.0)
MCV: 90.1 fL (ref 78.0–100.0)
Platelets: 205 10*3/uL (ref 150–400)
RBC: 4.55 MIL/uL (ref 3.87–5.11)
RDW: 13.5 % (ref 11.5–15.5)
WBC: 11.6 10*3/uL — AB (ref 4.0–10.5)

## 2016-03-12 LAB — LIPID PANEL
CHOLESTEROL: 158 mg/dL (ref 0–200)
HDL: 38 mg/dL — ABNORMAL LOW (ref >40)
LDL Cholesterol: 107 mg/dL — ABNORMAL HIGH (ref 0–99)
Total CHOL/HDL Ratio: 4.2 RATIO
Triglycerides: 66 mg/dL (ref <150)
VLDL: 13 mg/dL (ref 0–40)

## 2016-03-12 LAB — POCT I-STAT, CHEM 8
BUN: 46 mg/dL — AB (ref 6–20)
CHLORIDE: 104 mmol/L (ref 101–111)
CREATININE: 0.9 mg/dL (ref 0.44–1.00)
Calcium, Ion: 1.09 mmol/L — ABNORMAL LOW (ref 1.15–1.40)
Glucose, Bld: 103 mg/dL — ABNORMAL HIGH (ref 65–99)
HEMATOCRIT: 48 % — AB (ref 36.0–46.0)
Hemoglobin: 16.3 g/dL — ABNORMAL HIGH (ref 12.0–15.0)
POTASSIUM: 7.5 mmol/L — AB (ref 3.5–5.1)
Sodium: 134 mmol/L — ABNORMAL LOW (ref 135–145)
TCO2: 29 mmol/L (ref 0–100)

## 2016-03-12 LAB — T4, FREE: FREE T4: 1.13 ng/dL — AB (ref 0.61–1.12)

## 2016-03-12 LAB — TSH: TSH: 4.247 u[IU]/mL (ref 0.350–4.500)

## 2016-03-12 MED ORDER — HALOPERIDOL LACTATE 5 MG/ML IJ SOLN
0.5000 mg | Freq: Once | INTRAMUSCULAR | Status: AC
  Administered 2016-03-12: 0.5 mg via INTRAVENOUS
  Filled 2016-03-12: qty 1

## 2016-03-12 MED ORDER — DEXTROSE 5 % IV SOLN
1.0000 g | INTRAVENOUS | Status: DC
  Administered 2016-03-13: 1 g via INTRAVENOUS
  Filled 2016-03-12 (×2): qty 10

## 2016-03-12 MED ORDER — IOPAMIDOL (ISOVUE-370) INJECTION 76%
INTRAVENOUS | Status: AC
  Administered 2016-03-12: 50 mL
  Filled 2016-03-12: qty 50

## 2016-03-12 MED ORDER — STROKE: EARLY STAGES OF RECOVERY BOOK
Freq: Once | Status: AC
  Administered 2016-03-12: 05:00:00
  Filled 2016-03-12: qty 1

## 2016-03-12 MED ORDER — ATORVASTATIN CALCIUM 10 MG PO TABS
20.0000 mg | ORAL_TABLET | Freq: Every day | ORAL | Status: DC
  Administered 2016-03-12: 20 mg via ORAL
  Filled 2016-03-12: qty 2

## 2016-03-12 MED ORDER — LEVOTHYROXINE SODIUM 100 MCG IV SOLR: 12.5000 ug | Freq: Every day | INTRAVENOUS | Status: DC

## 2016-03-12 MED ORDER — ACETAMINOPHEN 650 MG RE SUPP: 650.0000 mg | RECTAL | Status: DC | PRN

## 2016-03-12 MED ORDER — ASPIRIN 325 MG PO TABS
325.0000 mg | ORAL_TABLET | Freq: Every day | ORAL | Status: DC
  Administered 2016-03-12 – 2016-03-14 (×3): 325 mg via ORAL
  Filled 2016-03-12 (×3): qty 1

## 2016-03-12 MED ORDER — LEVOTHYROXINE SODIUM 25 MCG PO TABS
25.0000 ug | ORAL_TABLET | Freq: Every day | ORAL | Status: DC
  Administered 2016-03-12 – 2016-03-14 (×3): 25 ug via ORAL
  Filled 2016-03-12 (×3): qty 1

## 2016-03-12 MED ORDER — ACETAMINOPHEN 325 MG PO TABS: 650.0000 mg | ORAL_TABLET | ORAL | Status: DC | PRN

## 2016-03-12 MED ORDER — ASPIRIN 300 MG RE SUPP
300.0000 mg | Freq: Every day | RECTAL | Status: DC
  Filled 2016-03-12: qty 1

## 2016-03-12 MED ORDER — CLOPIDOGREL BISULFATE 75 MG PO TABS
75.0000 mg | ORAL_TABLET | Freq: Every day | ORAL | Status: DC
  Administered 2016-03-13 – 2016-03-14 (×2): 75 mg via ORAL
  Filled 2016-03-12 (×2): qty 1

## 2016-03-12 NOTE — Progress Notes (Signed)
Pharmacy Antibiotic Note  Shannon Gomez is a [age] y.o. female admitted on 03/11/2016 with UTI.  Pharmacy has been consulted for Rocephin dosing.  Plan: Rocephin 1g IV Q24H.  Pharmacy will sign off.  Height: 5' (152.4 cm) Weight: 121 lb (54.9 kg) IBW/kg (Calculated) : 45.5  Temp (24hrs), Avg:98 F (36.7 C), Min:97.6 F (36.4 C), Max:98.1 F (36.7 C)   Recent Labs Lab 03/11/16 1919 03/11/16 1938  WBC 13.3*  --   CREATININE 0.96 0.90    Estimated Creatinine Clearance: 25.9 mL/min (by C-G formula based on SCr of 0.9 mg/dL).    Allergies  Allergen Reactions  . Sulfonamide Derivatives Other (See Comments)    On MAR    Thank you for allowing pharmacy to be a part of this patient's care.  Vernard GamblesVeronda Priya Matsen, PharmD, BCPS  03/12/2016 4:30 AM

## 2016-03-12 NOTE — Consult Note (Signed)
NEURO HOSPITALIST CONSULT NOTE   Requestig physician: Dr. Eudelia Bunchardama   Reason for Consult: New left MCA CVA   History obtained from:  Chart   HPI:                                                                                                                                          Shannon Gomez is an [age] y.o. female who presents via EMS from [facility] with new onset slurred speech, right facial droop and RUE weakness. Initially noted at 1600 on 11/6 but time last normal was unknown.   MRI performed here revealed the following: Small volume acute ischemic left MCA cortical infarct involving the left insula and frontal operculum, seen in association with an abnormal flow void within the left ICA, suspected to be occluded. There is a 2 cm densely calcified meningioma overlying the left temporal lobe without associated edema. Prominent generalized age-related cerebral atrophy with mild chronic smallvessel ischemic disease is also noted.  She has a history of dementia.    Past Medical History:  Diagnosis Date  . Chronic kidney disease   . Chronic pain   . COLONIC POLYPS, HX OF 01/01/2008   Qualifier: Diagnosis of  By: Andrey CampanileWilson MD, Raliegh IpLauraLee    . Dementia   . DIVERTICULITIS, HX OF 01/01/2008   Qualifier: Diagnosis of  By: Andrey CampanileWilson MD, Raliegh IpLauraLee    . GASTRIC ULCER 01/01/2008   Qualifier: Diagnosis of  By: Andrey CampanileWilson MD, Raliegh IpLauraLee    . GERD (gastroesophageal reflux disease)   . HCAP (healthcare-associated pneumonia) 05/17/2013  . Hypertension   . Thyroid disease   . Vitamin D deficiency     History reviewed. No pertinent surgical history.  No family history on file.  Social History:  reports that she has never smoked. She does not have any smokeless tobacco history on file. Her alcohol and drug histories are not on file.  Allergies  Allergen Reactions  . Sulfonamide Derivatives Other (See Comments)    On MAR    MEDICATIONS:                                                                                                                      Current Meds  Medication Sig  . Acetaminophen 325 MG CAPS Take  650 mg by mouth every 6 (six) hours as needed (pain).   Marland Kitchen amLODipine (NORVASC) 5 MG tablet Take 5 mg by mouth daily.   . calcium carbonate (OS-CAL) 600 MG TABS tablet Take 600 mg by mouth 2 (two) times daily with a meal.  . ketoconazole (NIZORAL) 2 % shampoo Apply 1 application topically 2 (two) times a week. On bath days  . levothyroxine (SYNTHROID, LEVOTHROID) 25 MCG tablet Take 25 mcg by mouth daily before breakfast.   . OVER THE COUNTER MEDICATION Take 120 mLs by mouth daily. "medpass"-protein supplement given when taking meds  . sennosides-docusate sodium (SENOKOT-S) 8.6-50 MG tablet Take 1 tablet by mouth at bedtime.   Marland Kitchen zinc oxide (BALMEX) 11.3 % CREA cream Apply 1 application topically. Every shift    ROS:                                                                                                                                       Unable to obtain ROS due to aphasia. No evidence for pain.  Blood pressure (!) 182/51, pulse (!) 56, temperature 98.1 F (36.7 C), temperature source Oral, resp. rate 21, height 5' (1.524 m), weight 54.4 kg (120 lb), SpO2 99 %.   General exam:  HEENT-  Normocephalic, no lesions, without obvious abnormality.  Normal external eye and conjunctiva.  . Lungs- No gross wheezes, respirations unlabored.  Extremities- Noncyanotic, well-perfused   Neurological Examination Mental Status: Awake, alert, non-agitated. Nonsensical fluent speech. Unable to follow any verbal commands, but will perform simple commands with visual prompting. Unable to name a pen or a glove.  Cranial Nerves: II: Visual fields grossly normal, pupils equal, round, reactive to light. III,IV, VI: ptosis not present, extra-ocular motions intact bilaterally with saccadic visual pursuits noted.  V,VII: Right facial droop. Facial light touch  sensation normal bilaterally VIII: hearing grossly intact to auditory stimuli IX,X: No hypophonia or hoarseness XI: No asymmetry XII: midline tongue extension Motor: Right : Upper extremity   5/5  Lower extremity 5/5    Left:     Upper extremity   5/5   Lower extremity 5/5.  Paratonia noted.  Sensory: Reacts to tactile stimulation x 4.  Deep Tendon Reflexes: Symmetrically hypoactive x 4.  Plantars: Equivocal bilaterally.  Cerebellar: No ataxia noted with gross arm movements.  Gait: Deferred due to falls risk concerns.    Lab Results: Basic Metabolic Panel:  Recent Labs Lab 03/11/16 1919 03/11/16 1938 03/11/16 2034  NA 132* 134*  --   K 7.5* 7.5* 4.0  CL 102 104  --   CO2 22  --   --   GLUCOSE 98 103*  --   BUN 26* 46*  --   CREATININE 0.96 0.90  --   CALCIUM 9.2  --   --     Liver Function Tests:  Recent Labs Lab 03/11/16 1919  AST 49*  ALT 17  ALKPHOS 82  BILITOT 1.4*  PROT 6.8  ALBUMIN 3.4*   No results for input(s): LIPASE, AMYLASE in the last 168 hours. No results for input(s): AMMONIA in the last 168 hours.  CBC:  Recent Labs Lab 03/11/16 1919 03/11/16 1938  WBC 13.3*  --   NEUTROABS 10.4*  --   HGB 15.3* 16.3*  HCT 44.9 48.0*  MCV 90.0  --   PLT 239  --     Cardiac Enzymes: No results for input(s): CKTOTAL, CKMB, CKMBINDEX, TROPONINI in the last 168 hours.  Lipid Panel: No results for input(s): CHOL, TRIG, HDL, CHOLHDL, VLDL, LDLCALC in the last 168 hours.  CBG: No results for input(s): GLUCAP in the last 168 hours.  Microbiology: Results for orders placed or performed in visit on 05/02/13  Urine culture     Status: None   Collection Time: 05/02/13  1:00 PM  Result Value Ref Range Status   Micro Text Report   Final       SOURCE: SOURCE NOT INDICATED    ORGANISM 1                >100,000 CFU/ML STREPTOCOCCUS AGALACTIAE(GROUP B)   ORGANISM 2                15,000 CFU/ML ESCHERICHIA COLI   COMMENT                   -   ANTIBIOTIC                     ORG#1    ORG#2     AMPICILLIN                             S         CEFAZOLIN                              S         CEFOXITIN                              S         CEFTRIAXONE                            S         CIPROFLOXACIN                          S         GENTAMICIN                             S         IMIPENEM                               S         LEVOFLOXACIN                           S         NITROFURANTOIN  S         TRIMETHOPRIM/SULFAMETHOXAZOLE          S             Coagulation Studies:  Recent Labs  03/11/16 1919  LABPROT 13.4  INR 1.02    Imaging: Ct Head Wo Contrast  Result Date: 03/11/2016 CLINICAL DATA:  Confusion with slurred speech EXAM: CT HEAD WITHOUT CONTRAST TECHNIQUE: Contiguous axial images were obtained from the base of the skull through the vertex without intravenous contrast. COMPARISON:  07/25/2009, MRI 03/11/2016 FINDINGS: Brain: No large territorial infarction or intracranial hemorrhage is visualized. Minimal periventricular white matter hypodensities consistent with small vessel disease. Moderate-to-marked global atrophy. Ventricular enlargement felt secondary to atrophy. Subtle low-attenuation within the left insular cortex, best seen on coronal views and felt to correspond to some of the MRI diffusion abnormality. Stable calcified mass in the left middle cranial fossa. Vascular: No hyperdense vessels are visualized. Carotid artery calcifications. Skull: Mastoid air cells are clear.  There is no fracture. Sinuses/Orbits: Mild mucosal thickening within the sinuses. No acute orbital abnormality. Other: None IMPRESSION: 1. No evidence for acute intracranial hemorrhage. Subtle low-density foci within the left temporal lobe and insular cortex, felt to correspond to some of the MRI diffusion abnormality. 2. Moderate to marked atrophy. Electronically Signed   By: Jasmine Pang M.D.   On: 03/11/2016 22:13   Mr Brain  Wo Contrast  Result Date: 03/11/2016 CLINICAL DATA:  Initial evaluation for acute right facial droop. EXAM: MRI HEAD WITHOUT CONTRAST TECHNIQUE: Multiplanar, multiecho pulse sequences of the brain and surrounding structures were obtained without intravenous contrast. COMPARISON:  Prior head CT from earlier the same day. FINDINGS: Brain: Diffuse prominence of the CSF containing spaces is compatible with generalized age-related cerebral atrophy. Patchy T2/FLAIR hyperintensity within the periventricular and deep white matter most compatible chronic microvascular ischemic changes, mild for age. There is patchy restricted diffusion involving the cortical gray matter of the left insular cortex and left frontal operculum, compatible with acute left MCA territory infarct. Minimal patchy cortical infarct more superiorly within the left posterior frontal lobe. No associated hemorrhage or mass effect. Minimal susceptibility artifact within the left sylvian fissure may reflect intraluminal thrombus within distal left MCA vasculature. No other evidence for acute infarct. Gray-white matter differentiation otherwise maintained. No other evidence for acute or chronic intracranial hemorrhage. Densely calcified 2 cm meningioma overlies the left temporal lobe. No associated edema. The no other mass lesion. No midline shift or mass effect. Ventricular prominence related to global parenchymal volume loss without hydrocephalus. No extra-axial fluid collection. Major dural sinuses are patent. Pituitary gland within normal limits. Vascular: Abnormal flow void within the left ICA, which may be related to slow flow and/ or occlusion (series 6, image 8). Major intracranial vascular flow voids otherwise maintained. Skull and upper cervical spine: Craniocervical junction normal. Degenerative spondylolysis noted within the visualized upper cervical spine without significant stenosis. Bone marrow signal intensity normal. No scalp soft tissue  abnormality. Sinuses/Orbits: Globes and orbital soft tissues within normal limits. Patient is status post cataract extraction bilaterally. Mild scattered mucosal thickening within the ethmoidal air cells and maxillary sinuses. No air-fluid level to suggest active sinus infection. Small right mastoid effusion. Inner ear structures grossly normal. IMPRESSION: 1. Small volume acute ischemic left MCA cortical infarct involving the left insula and frontal operculum. No associated hemorrhage or mass effect. 2. Abnormal flow void within the left ICA, suspected to be occluded. This could be further evaluated with dedicated CTA.  3. 2 cm densely calcified meningioma overlying the left temporal lobe without associated edema. 4. Generalized age-related cerebral atrophy with mild chronic small vessel ischemic disease. Electronically Signed   By: Rise MuBenjamin  McClintock M.D.   On: 03/11/2016 22:28   Dg Chest Port 1 View  Result Date: 03/11/2016 CLINICAL DATA:  Slurred speech history of dementia EXAM: PORTABLE CHEST 1 VIEW COMPARISON:  08/19/2009 FINDINGS: Semi-erect portable view chest demonstrates low lung volumes and elevation of the right diaphragm. There is no focal infiltrate or effusion. Cardiomediastinal silhouette stable with borderline enlargement of the heart size. Atherosclerosis. No pneumothorax. IMPRESSION: 1. Borderline to mild cardiomegaly without overt failure 2. Atherosclerosis of the aorta 3. Low lung volumes.  No focal infiltrate Electronically Signed   By: Jasmine PangKim  Fujinaga M.D.   On: 03/11/2016 20:49    Assessment: 1. Acute left MCA territory ischemic stroke. Possible etiologies include cardioembolic stroke and artery to artery thromboembolism. The probable left ICA severe stenosis versus occlusion seen on the MRI is likely a predisposing factor. No antiplatelet agent or anticoagulant listed on her home meds in EPIC.  2. Dementia.  3. CKD. 4. HTN. 5. Hypothyroidism.  6. Possible infection contributing  to AMS.   Plan: 1. MRA of head.  2. Carotid ultrasound.  3. TTE.  4. Speech consult (swallow and cognitive) 5. PT/OT. 6. Start ASA.  7. Given advanced age, benefits of statin are likely outweighed by risks.  8. Permissive HTN x 24 hours.  9. May benefit from a trial of Aricept for her dementia.  10. TSH level.   03/12/2016, 12:17 AM

## 2016-03-12 NOTE — [place] FL2 (Signed)
[name] MEDICAID FL2 LEVEL OF CARE SCREENING TOOL     IDENTIFICATION  Patient Name: Shannon Gomez Birthdate: 05/02/1914 Sex: female Admission Date (Current Location): 03/11/2016  [facility] [place] [place]   Facility and Address:  [facility], [address], [place], [place] [address]      Provider Number: [id number]  Attending Physician Name and Address:  Starleen Armsawood S Elgergawy, MD  Relative Name and Phone Number:  Edgardo RoysBill Neese [facility](Nephew) [phone]    Current Level of Care: Hospital Recommended Level of Care: Skilled Nursing Facility Prior Approval Number:    Date Approved/Denied:   PASRR Number: [id number]  Discharge Plan: SNF    Current Diagnoses: Patient Active Problem List   Diagnosis Date Noted  . Stroke (cerebrum) (HCC) 03/12/2016  . Stroke (HCC) 03/11/2016  . Seborrheic dermatitis of scalp 07/11/2015  . Bradycardia 06/22/2015  . COPD (chronic obstructive pulmonary disease) (HCC) 04/30/2015  . Vitamin D deficiency 04/30/2015  . Chronic pain 08/01/2014  . Senile osteoporosis 08/01/2014  . Dry mouth 08/01/2014  . Xerophthalmia 12/16/2013  . Essential hypertension, benign 11/29/2013  . FTT (failure to thrive) in adult 05/30/2013  . Dementia without behavioral disturbance 04/27/2013  . Edema 12/23/2012  . Constipation 12/23/2012  . Hypothyroidism 01/01/2008  . DEPRESSION 01/01/2008  . GLAUCOMA 01/01/2008  . CAD 01/01/2008  . ABNORMAL HEART RHYTHMS 01/01/2008  . ALLERGIC RHINITIS 01/01/2008  . COPD 01/01/2008  . GERD 01/01/2008  . Osteoarthritis 01/01/2008  . INSOMNIA 01/01/2008    Orientation RESPIRATION BLADDER Height & Weight     Self  Normal Continent Weight: 121 lb (54.9 kg) Height:  5' (152.4 cm)  BEHAVIORAL SYMPTOMS/MOOD NEUROLOGICAL BOWEL NUTRITION STATUS      Continent Diet (Dysphagia 2)  AMBULATORY STATUS COMMUNICATION OF NEEDS Skin   Extensive Assist Verbally Normal                        Personal Care Assistance Level of Assistance  Dressing, Bathing, Feeding Bathing Assistance: Maximum assistance Feeding assistance: Limited assistance Dressing Assistance: Maximum assistance     Functional Limitations Info  Sight, Hearing, Speech Sight Info: Adequate Hearing Info: Impaired (Hard of hearing) Speech Info: Impaired (Difficult to understand)    SPECIAL CARE FACTORS FREQUENCY  PT (By licensed PT)     PT Frequency: Min 3X/week              Contractures Contractures Info: Not present    Additional Factors Info  Code Status, Allergies Code Status Info: DNR Allergies Info: Sulfonamide Derivatives           Current Medications (03/12/2016):  This is the current hospital active medication list Current Facility-Administered Medications  Medication Dose Route Frequency Provider Last Rate Last Dose  . acetaminophen (TYLENOL) tablet 650 mg  650 mg Oral Q4H PRN Michael LitterNikki Carter, MD       Or  . acetaminophen (TYLENOL) suppository 650 mg  650 mg Rectal Q4H PRN Michael LitterNikki Carter, MD      . aspirin suppository 300 mg  300 mg Rectal Daily Michael LitterNikki Carter, MD       Or  . aspirin tablet 325 mg  325 mg Oral Daily Michael LitterNikki Carter, MD   325 mg at 03/12/16 1458  . atorvastatin (LIPITOR) tablet 20 mg  20 mg Oral q1800 Starleen Armsawood S Elgergawy, MD   20 mg at 03/12/16 1459  . [START ON 03/13/2016] cefTRIAXone (ROCEPHIN) 1 g in dextrose 5 % 50  mL IVPB  1 g Intravenous Q24H Juliette MangleVeronda P Bryk, RPH      . [START ON 03/13/2016] levothyroxine (SYNTHROID, LEVOTHROID) tablet 25 mcg  25 mcg Oral QAC breakfast Starleen Armsawood S Elgergawy, MD   25 mcg at 03/12/16 1459     Discharge Medications: Please see discharge summary for a list of discharge medications.  Relevant Imaging Results:  Relevant Lab Results:   Additional Information SSN:  [id number]  Harvey LionsLecretia Eboney [place], Student-Social Work [phone]

## 2016-03-12 NOTE — H&P (Signed)
History and Physical    Shannon Gomez OZH:[id number] DOB: 1913-08-08 DOA: 03/11/2016  PCP: Oneal Grout, MD   Patient coming from: [facility]  Chief Complaint: Slurred speech, RUE weakness  HPI: Shannon Gomez is a 82 y.o. woman with a history of dementia, HTN, hypothyroidism, and CKD who was referred to the ED tonight for evaluation of new slurred speech with right facial droop and right upper extremity weakness.  Time that she was last known normal is unclear.  She is alone in the ED and has an obvious expressive aphasia.  I spoke to her nephew by phone, but he could not give any additional history (particularly regarding pre-existing conditions because he has only been involved in her care for the past 10 years).  The patient is awake and alert but cannot clearly articulate what she wants to say.  She denies headache, chest pain, or shortness of breath.  She becomes frustrated when I attempt to perform ROS, stating "I am not sick".  She has slurred speech and word finding difficulty.  She is only oriented to person at this time.  ED Course: STAT head CT showed subtle low density foci in the left temporal lobe and insular cortex.  MRI confirmed acute ischemic Left Middle Cerebral Artery cortical infarct.  Neurology consulted.  She also has evidence of a UTI with U/A showing large leukocytes, TNTC WBC, and many bacteria.  She has received IV Rocephin. Hospitalist asked to admit.  Review of Systems: Limited due to mental status changes and expressive aphasia.   Past Medical History:  Diagnosis Date  . Chronic kidney disease   . Chronic pain   . COLONIC POLYPS, HX OF 01/01/2008   Qualifier: Diagnosis of  By: Andrey Campanile MD, Raliegh Ip    . Dementia   . DIVERTICULITIS, HX OF 01/01/2008   Qualifier: Diagnosis of  By: Andrey Campanile MD, Raliegh Ip    . GASTRIC ULCER 01/01/2008   Qualifier: Diagnosis of  By: Andrey Campanile MD, Raliegh Ip    . GERD (gastroesophageal reflux disease)   . HCAP  (healthcare-associated pneumonia) 05/17/2013  . Hypertension   . Thyroid disease   . Vitamin D deficiency     History reviewed. No pertinent surgical history.  Patient's nephew could not give any additional details re: past surgeries.  He reports that she has not had any major surgeries or invasive procedures in the past 10 years that he has been involved in her care.  An H/P from 2011 mentions bilateral cataract removal, Tonsillectomy/adenoidectomy, oophrectomy, and removal of breast cyst.   reports that she has never smoked. She does not have any smokeless tobacco history on file. Her alcohol and drug histories are not on file.  No known history of tobacco, EtOH, or illicit drug use.    Allergies  Allergen Reactions  . Sulfonamide Derivatives Other (See Comments)    On MAR    FAMILY HISTORY: Patient's brother has heart disease.  Prior to Admission medications   Medication Sig Start Date End Date Taking? Authorizing Provider  Acetaminophen 325 MG CAPS Take 650 mg by mouth every 6 (six) hours as needed (pain).    Yes Historical Provider, MD  amLODipine (NORVASC) 5 MG tablet Take 5 mg by mouth daily.    Yes Historical Provider, MD  calcium carbonate (OS-CAL) 600 MG TABS tablet Take 600 mg by mouth 2 (two) times daily with a meal.   Yes Historical Provider, MD  ketoconazole (NIZORAL) 2 % shampoo Apply 1 application topically 2 (two) times  a week. On bath days   Yes Historical Provider, MD  levothyroxine (SYNTHROID, LEVOTHROID) 25 MCG tablet Take 25 mcg by mouth daily before breakfast.    Yes Historical Provider, MD  OVER THE COUNTER MEDICATION Take 120 mLs by mouth daily. "medpass"-protein supplement given when taking meds   Yes Historical Provider, MD  sennosides-docusate sodium (SENOKOT-S) 8.6-50 MG tablet Take 1 tablet by mouth at bedtime.    Yes Historical Provider, MD  zinc oxide (BALMEX) 11.3 % CREA cream Apply 1 application topically. Every shift   Yes Historical Provider, MD    UNABLE TO FIND Med Name: Med pass 120 mL daily    Historical Provider, MD    Physical Exam: Vitals:   03/11/16 2218 03/11/16 2230 03/12/16 0027 03/12/16 0030  BP: 169/64 (!) 182/51 (!) 148/47 (!) 151/49  Pulse:  (!) 56 (!) 56 (!) 53  Resp:  21 23 16   Temp: 98.1 F (36.7 C)     TempSrc: Oral     SpO2: 98% 99% 98% 99%  Weight:      Height:          Constitutional: NAD, calm, comfortable Vitals:   03/11/16 2218 03/11/16 2230 03/12/16 0027 03/12/16 0030  BP: 169/64 (!) 182/51 (!) 148/47 (!) 151/49  Pulse:  (!) 56 (!) 56 (!) 53  Resp:  21 23 16   Temp: 98.1 F (36.7 C)     TempSrc: Oral     SpO2: 98% 99% 98% 99%  Weight:      Height:       Eyes: PERRL, lids and conjunctivae normal ENMT: Mucous membranes are dry. Posterior pharynx clear of any exudate or lesions. Normal dentition.  Neck: normal appearance Respiratory: clear to auscultation bilaterally, no wheezing, no crackles. Normal respiratory effort. No accessory muscle use.  Cardiovascular: Normal rate, regular rhythm, no murmurs / rubs / gallops. No extremity edema. 2+ pedal pulses. GI: abdomen is soft and compressible.  No distention.  No tenderness.  Bowel sounds are present. Musculoskeletal:  No joint deformity in upper and lower extremities. Good ROM, no contractures. Normal muscle tone.  Skin: no rashes, warm and dry Neurologic: She has subtle right facial droop.  Otherwise, CN 2-12 grossly intact. Sensation intact, Strength symmetric bilaterally; I do not find significant right arm weakness at present.  She has an expressive aphasia. Psychiatric: Disoriented.  Judgement impaired.     Labs on Admission: I have personally reviewed following labs and imaging studies  CBC:  Recent Labs Lab 03/11/16 1919 03/11/16 1938  WBC 13.3*  --   NEUTROABS 10.4*  --   HGB 15.3* 16.3*  HCT 44.9 48.0*  MCV 90.0  --   PLT 239  --    Basic Metabolic Panel:  Recent Labs Lab 03/11/16 1919 03/11/16 1938 03/11/16 2034   NA 132* 134*  --   K 7.5* 7.5* 4.0  CL 102 104  --   CO2 22  --   --   GLUCOSE 98 103*  --   BUN 26* 46*  --   CREATININE 0.96 0.90  --   CALCIUM 9.2  --   --    GFR: Estimated Creatinine Clearance: 23.9 mL/min (by C-G formula based on SCr of 0.9 mg/dL). Liver Function Tests:  Recent Labs Lab 03/11/16 1919  AST 49*  ALT 17  ALKPHOS 82  BILITOT 1.4*  PROT 6.8  ALBUMIN 3.4*   Coagulation Profile:  Recent Labs Lab 03/11/16 1919  INR 1.02   Urine analysis:  Component Value Date/Time   COLORURINE YELLOW 03/11/2016 2237   APPEARANCEUR CLOUDY (A) 03/11/2016 2237   APPEARANCEUR Turbid 05/02/2013 1300   LABSPEC 1.019 03/11/2016 2237   LABSPEC 1.013 05/02/2013 1300   PHURINE 7.5 03/11/2016 2237   GLUCOSEU NEGATIVE 03/11/2016 2237   GLUCOSEU Negative 05/02/2013 1300   HGBUR MODERATE (A) 03/11/2016 2237   BILIRUBINUR NEGATIVE 03/11/2016 2237   BILIRUBINUR Negative 05/02/2013 1300   KETONESUR NEGATIVE 03/11/2016 2237   PROTEINUR >300 (A) 03/11/2016 2237   UROBILINOGEN 0.2 08/19/2009 1416   NITRITE NEGATIVE 03/11/2016 2237   LEUKOCYTESUR LARGE (A) 03/11/2016 2237   LEUKOCYTESUR 2+ 05/02/2013 1300    Radiological Exams on Admission: Ct Head Wo Contrast  Result Date: 03/11/2016 CLINICAL DATA:  Confusion with slurred speech EXAM: CT HEAD WITHOUT CONTRAST TECHNIQUE: Contiguous axial images were obtained from the base of the skull through the vertex without intravenous contrast. COMPARISON:  07/25/2009, MRI 03/11/2016 FINDINGS: Brain: No large territorial infarction or intracranial hemorrhage is visualized. Minimal periventricular white matter hypodensities consistent with small vessel disease. Moderate-to-marked global atrophy. Ventricular enlargement felt secondary to atrophy. Subtle low-attenuation within the left insular cortex, best seen on coronal views and felt to correspond to some of the MRI diffusion abnormality. Stable calcified mass in the left middle cranial  fossa. Vascular: No hyperdense vessels are visualized. Carotid artery calcifications. Skull: Mastoid air cells are clear.  There is no fracture. Sinuses/Orbits: Mild mucosal thickening within the sinuses. No acute orbital abnormality. Other: None IMPRESSION: 1. No evidence for acute intracranial hemorrhage. Subtle low-density foci within the left temporal lobe and insular cortex, felt to correspond to some of the MRI diffusion abnormality. 2. Moderate to marked atrophy. Electronically Signed   By: Jasmine PangKim  Fujinaga M.D.   On: 03/11/2016 22:13   Mr Brain Wo Contrast  Result Date: 03/11/2016 CLINICAL DATA:  Initial evaluation for acute right facial droop. EXAM: MRI HEAD WITHOUT CONTRAST TECHNIQUE: Multiplanar, multiecho pulse sequences of the brain and surrounding structures were obtained without intravenous contrast. COMPARISON:  Prior head CT from earlier the same day. FINDINGS: Brain: Diffuse prominence of the CSF containing spaces is compatible with generalized age-related cerebral atrophy. Patchy T2/FLAIR hyperintensity within the periventricular and deep white matter most compatible chronic microvascular ischemic changes, mild for age. There is patchy restricted diffusion involving the cortical gray matter of the left insular cortex and left frontal operculum, compatible with acute left MCA territory infarct. Minimal patchy cortical infarct more superiorly within the left posterior frontal lobe. No associated hemorrhage or mass effect. Minimal susceptibility artifact within the left sylvian fissure may reflect intraluminal thrombus within distal left MCA vasculature. No other evidence for acute infarct. Gray-white matter differentiation otherwise maintained. No other evidence for acute or chronic intracranial hemorrhage. Densely calcified 2 cm meningioma overlies the left temporal lobe. No associated edema. The no other mass lesion. No midline shift or mass effect. Ventricular prominence related to global  parenchymal volume loss without hydrocephalus. No extra-axial fluid collection. Major dural sinuses are patent. Pituitary gland within normal limits. Vascular: Abnormal flow void within the left ICA, which may be related to slow flow and/ or occlusion (series 6, image 8). Major intracranial vascular flow voids otherwise maintained. Skull and upper cervical spine: Craniocervical junction normal. Degenerative spondylolysis noted within the visualized upper cervical spine without significant stenosis. Bone marrow signal intensity normal. No scalp soft tissue abnormality. Sinuses/Orbits: Globes and orbital soft tissues within normal limits. Patient is status post cataract extraction bilaterally. Mild scattered mucosal thickening within the  ethmoidal air cells and maxillary sinuses. No air-fluid level to suggest active sinus infection. Small right mastoid effusion. Inner ear structures grossly normal. IMPRESSION: 1. Small volume acute ischemic left MCA cortical infarct involving the left insula and frontal operculum. No associated hemorrhage or mass effect. 2. Abnormal flow void within the left ICA, suspected to be occluded. This could be further evaluated with dedicated CTA. 3. 2 cm densely calcified meningioma overlying the left temporal lobe without associated edema. 4. Generalized age-related cerebral atrophy with mild chronic small vessel ischemic disease. Electronically Signed   By: Rise Mu M.D.   On: 03/11/2016 22:28   Dg Chest Port 1 View  Result Date: 03/11/2016 CLINICAL DATA:  Slurred speech history of dementia EXAM: PORTABLE CHEST 1 VIEW COMPARISON:  08/19/2009 FINDINGS: Semi-erect portable view chest demonstrates low lung volumes and elevation of the right diaphragm. There is no focal infiltrate or effusion. Cardiomediastinal silhouette stable with borderline enlargement of the heart size. Atherosclerosis. No pneumothorax. IMPRESSION: 1. Borderline to mild cardiomegaly without overt failure  2. Atherosclerosis of the aorta 3. Low lung volumes.  No focal infiltrate Electronically Signed   By: Jasmine Pang M.D.   On: 03/11/2016 20:49    EKG: Independently reviewed. NSR.  RBBB.  Assessment/Plan Principal Problem:   Stroke [facility]) Active Problems:   Hypothyroidism   Essential hypertension, benign   Stroke (cerebrum) (HCC)      Acute CVA --Neurology consult appreciated --Telemetry monitoring with neurochecks per protocol --MRA head/neck --Carotid dopplers --Complete echo --PT/OT/Speech eval and treat --Start aspirin; can be given per rectum if she fails initial swallow study --Permissive hypertension for the first 24 hours  UTI --IV Rocephin --Urine culture ordered  Hypothyroidism --Check TSH --Continue levothyroxine, will give half of oral dose intravenously  HTN --Holding amlodipine for now   DVT prophylaxis: SCDs Code Status: DNR Family Communication: I spoke with her nephew by phone, who is her next of kin. Disposition Plan: To be determined.  Expect she will need SNF level of care. Consults called: Neurology Admission status: Inpatient, telemetry   TIME SPENT: 70 minutes   Jerene Bears MD [facility] Pager [phone]  If 7PM-7AM, please contact night-coverage [email or web address] Password [email or web address]  03/12/2016, 1:18 AM

## 2016-03-12 NOTE — Clinical Social Work Note (Signed)
Clinical Social Work Assessment  Patient Details  Name: Dianah Fieldlizabeth N Tietze MRN: [id number] Date of Birth: 10/09/1913  Date of referral:  03/12/16               Reason for consult:  Facility Placement                Permission sought to share information with:  Other (Patient not oriented.) Permission granted to share information::  No  Name::     Edgardo RoysBill Neese  Agency::     Relationship::  Nephew  Contact Information:  [phone]  Housing/Transportation Living arrangements for the past 2 months:  Skilled Nursing Facility Source of Information:  Other (Comment Required) (Nephew) Patient Interpreter Needed:  None Criminal Activity/Legal Involvement Pertinent to Current Situation/Hospitalization:  No - Comment as needed Significant Relationships:  Other(Comment) (Nephew) Lives with:  Facility Resident Do you feel safe going back to the place where you live?  Yes Need for family participation in patient care:  Yes (Comment)  Care giving concerns:  CSW Intern spoke with patient's nephew - Edgardo RoysBill Neese.  There were not care giving concerns addressed.     Social Worker assessment / plan:  CSW Intern spoke with Edgardo RoysBill Neese (patient's nephew) by phone at [phone].  CSW Intern explained to Mr. Damian Leavelleese that [name] will  return to [facility] on [facility]13[facility]2016 and he was agreeable to this plan.  Annette StableBill stated that [name] had been at the facility for 4-5 years and expressed that he was pleased with the care that she had been receiving and is looking forward to her returning [facility].  Annette StableBill stated that [name] had one child who passed away in a car accident approximately 30 years ago, but did not discuss any further.  CSW Intern informed Annette StableBill that patient would discharge to [facility] by ambulance and he was agreeable to her transfer.    Employment status:  Retired Health and safety inspectornsurance information:  Medicare PT Recommendations:  Skilled Nursing Facility Information / Referral to community  resources:  Other (Comment Required) (Patient is a resident of [facility] facility)  Patient/Family's Response to care:  He did not express any concerns regarding patient's care during hospitalization.    Patient/Family's Understanding of and Emotional Response to Diagnosis, Current Treatment, and Prognosis:  Not discussed  Emotional Assessment Appearance:  Other (Comment Required (Not Assessed) Attitude/Demeanor/Rapport:  Unable to Assess Affect (typically observed):  Unable to Assess Orientation:    Alcohol / Substance use:  Not Applicable Psych involvement (Current and /or in the community):  No (Comment)  Discharge Needs  Concerns to be addressed:  No discharge needs identified Readmission within the last 30 days:  No Current discharge risk:  None Barriers to Discharge:  No Barriers Identified   Renard HamperLecretia Emilia Kayes, Student-Social Work 03/12/2016, 3:47 PM

## 2016-03-12 NOTE — Consult Note (Signed)
Patient name: Shannon Gomez Currington MRN: [id number] DOB: 12/16/1913 Sex: female  REASON FOR CONSULT: Left carotid stenosis. Consult is from [facility].  HPI: Shannon Gomez Rede is a [age] y.o. female, who presented to the emergency department last night with expressive aphasia and right upper extremity weakness. Her workup included a CTA which shows a severe left carotid stenosis and vascular surgery was consult.  Of note, because of the patient's history of dementia and expressive aphasia I really cannot obtain any meaningful history from the patient. The history is obtained from her records.  [facility] and has a history of dementia. She developed a sudden onset of slurred speech, right facial droop, and right upper extremity weakness at 4:00 on 03/11/2016.  Past Medical History:  Diagnosis Date  . Chronic kidney disease   . Chronic pain   . COLONIC POLYPS, HX OF 01/01/2008   Qualifier: Diagnosis of  By: Andrey CampanileWilson MD, Raliegh IpLauraLee    . Dementia   . DIVERTICULITIS, HX OF 01/01/2008   Qualifier: Diagnosis of  By: Andrey CampanileWilson MD, Raliegh IpLauraLee    . GASTRIC ULCER 01/01/2008   Qualifier: Diagnosis of  By: Andrey CampanileWilson MD, Raliegh IpLauraLee    . GERD (gastroesophageal reflux disease)   . HCAP (healthcare-associated pneumonia) 05/17/2013  . Hypertension   . Thyroid disease   . Vitamin D deficiency     No family history on file.  SOCIAL HISTORY: Social History   Social History  . Marital status: Divorced    Spouse name: Gomez/A  . Number of children: Gomez/A  . Years of education: Gomez/A   Occupational History  . Not on file.   Social History Main Topics  . Smoking status: Never Smoker  . Smokeless tobacco: Not on file  . Alcohol use Not on file  . Drug use: Unknown  . Sexual activity: No   Other Topics Concern  . Not on file   Social History Narrative  . No narrative on file    Allergies  Allergen Reactions  . Sulfonamide Derivatives Other (See Comments)    On MAR     Current Facility-Administered Medications  Medication Dose Route Frequency Provider Last Rate Last Dose  . acetaminophen (TYLENOL) tablet 650 mg  650 mg Oral Q4H PRN Michael LitterNikki Carter, MD       Or  . acetaminophen (TYLENOL) suppository 650 mg  650 mg Rectal Q4H PRN Michael LitterNikki Carter, MD      . aspirin suppository 300 mg  300 mg Rectal Daily Michael LitterNikki Carter, MD       Or  . aspirin tablet 325 mg  325 mg Oral Daily Michael LitterNikki Carter, MD   325 mg at 03/12/16 1458  . atorvastatin (LIPITOR) tablet 20 mg  20 mg Oral q1800 Starleen Armsawood S Elgergawy, MD   20 mg at 03/12/16 1459  . [START ON 03/13/2016] cefTRIAXone (ROCEPHIN) 1 g in dextrose 5 % 50 mL IVPB  1 g Intravenous Q24H Juliette MangleVeronda P Bryk, RPH      . [START ON 03/13/2016] levothyroxine (SYNTHROID, LEVOTHROID) tablet 25 mcg  25 mcg Oral QAC breakfast Starleen Armsawood S Elgergawy, MD   25 mcg at 03/12/16 1459    REVIEW OF SYSTEMS: I am unable to obtain a review of systems.    PHYSICAL EXAM: Vitals:   03/12/16 0330 03/12/16 0530 03/12/16 0900 03/12/16 1420  BP: 133/76 (!) 153/48 (!) 158/38 (!) 152/52  Pulse: (!) 51 (!) 50 (!) 50 (!) 53  Resp: 18 18 18  18  Temp: 97.6 F (36.4 C) 97.6 F (36.4 C) 98.1 F (36.7 C) 98.3 F (36.8 C)  TempSrc: Oral Oral Oral Oral  SpO2: 100% 100% 99% 100%  Weight:      Height:        GENERAL: The patient is a well-nourished female, in no acute distress. The vital signs are documented above. CARDIAC: There is a regular rate and rhythm.  VASCULAR: I do not detect carotid bruits although it is difficult to assess as it is difficult to get her to stop breathing. She has palpable femoral pulses. I cannot palpate pedal pulses however both feet are warm and well-perfused. PULMONARY: There is good air exchange bilaterally without wheezing or rales. ABDOMEN: Soft and non-tender with normal pitched bowel sounds.  MUSCULOSKELETAL: There are no major deformities or cyanosis. NEUROLOGIC: She does appear to have some persistent expressive aphasia.  There is mild right facial droop. She does move all extremities but is poorly cooperative to do a formal neurologic exam. SKIN: There are no ulcers or rashes noted. PSYCHIATRIC: The patient is alert.  DATA:   CT HEAD: Her CT of the head on 03/11/2016 shows no evidence of acute intracranial hemorrhage. There is moderate to marked atrophy.  MR BRAIN: Her MR of the brain shows a small acute left middle cerebral artery cortical infarct. It is felt that there is abnormal flow in the left internal carotid artery and that this could potentially be occluded. There is also a 2 cm calcified meningioma overlying the left temporal lobe without associated edema.  CT ANGIOGRAM NECK: I have reviewed the images of the CT angiogram of the neck. There is a moderate right carotid stenosis of a proximal 60%. There is a severe stenosis of the proximal left internal carotid artery and the internal carotid artery above this is small. Radiology interpreted this as possibly being occluded distally. There is calcific disease in both carotids which makes determination of the severity of the stenosis difficult. There is also multiple areas of intracranial atherosclerosis. There is also severe atherosclerosis of the aortic arch.  MEDICAL ISSUES:  LEFT BRAIN STROKE WITH SIGNIFICANT LEFT CAROTID STENOSIS: This patient has a left brain stroke by MRI and history and has a significant left carotid stenosis. It is difficult to determine the severity of the stenosis given the calcific disease and I think it would be useful to have a carotid duplex scan. However, given her age, history of dementia, and debilitated state I do not think that she is a good candidate for surgery. Likewise, she is not a good candidate for stenting for the same reasons and also because she has a very calcified aortic arch. I would treat her medically and would not recommend an aggressive approach to her stroke.    Waverly Ferrariickson, [facility] [phone]

## 2016-03-12 NOTE — Progress Notes (Signed)
PROGRESS NOTE                                                                                                                                                                                                             Patient Demographics:    Shannon Gomez, is a 82 y.o. female, DOB - Sep 30, 1913, [id number]  Admit date - 03/11/2016   Admitting Physician Michael Litter, MD  Outpatient Primary MD for the patient is Oneal Grout, MD  LOS - 1   Chief Complaint  Patient presents with  . Aphasia       Brief Narrative  Recent admitted earlier during the day but my colleague, chart, labs and imaging were reviewed, she presents with slurred speech, right facial droop and right upper extremity weakness, workup significant for acute CVA.    Subjective:    Shannon Gomez today Is confused, unable to provide any complaints  Assessment  & Plan :    Principal Problem:   Stroke [name] Active Problems:   Hypothyroidism   Essential hypertension, benign   Stroke (cerebrum) (HCC)  Acute CVA - MRI brain showing Small L MCA cortical infarct L insula and frontal operculum. L ICA abnormal flow void. - CTA head and neck significant for right ICA 60% stenosis, left ICA 90% stenosis, vascular surgery consulted - 2-D echo pending - LDL is 107 - No antithrombotic. To admission, currently on full dose aspirin  Bilateral ICA stenosis - CTA head and neck significant for right ICA 60% stenosis, left ICA 90% stenosis, vascular surgery consulted  Hypertension - Allow for permissive hypertension  Hyperlipidemia - LDL is 107, will start on Lipitor  UTI - Continue with Rocephin, follow on urine cultures  Hypothyroidism - continue with Synthroid   Code Status : DNR  Family Communication  : None at bedside   Disposition Plan  : Back to SNF once stable   Procedures  : None  Consults: neuro, vascular  DVT Prophylaxis  :   SCDs   Lab Results    Component Value Date   PLT 205 03/12/2016    Antibiotics  :    Anti-infectives    Start     Dose/Rate Route Frequency Ordered Stop   03/13/16 0000  cefTRIAXone (ROCEPHIN) 1 g in dextrose 5 % 50 mL IVPB     1 g 100  mL/hr over 30 Minutes Intravenous Every 24 hours 03/12/16 0434     03/11/16 2345  cefTRIAXone (ROCEPHIN) 1 g in dextrose 5 % 50 mL IVPB     1 g 100 mL/hr over 30 Minutes Intravenous  Once 03/11/16 2331 03/12/16 0058        Objective:   Vitals:   03/12/16 0330 03/12/16 0530 03/12/16 0900 03/12/16 1420  BP: 133/76 (!) 153/48 (!) 158/38 (!) 152/52  Pulse: (!) 51 (!) 50 (!) 50 (!) 53  Resp: 18 18 18 18   Temp: 97.6 F (36.4 C) 97.6 F (36.4 C) 98.1 F (36.7 C) 98.3 F (36.8 C)  TempSrc: Oral Oral Oral Oral  SpO2: 100% 100% 99% 100%  Weight:      Height:        Wt Readings from Last 3 Encounters:  03/12/16 54.9 kg (121 lb)  02/27/16 54 kg (119 lb)  01/29/16 54.9 kg (121 lb)    No intake or output data in the 24 hours ending 03/12/16 1434   Physical Exam  Awake Alert, Pleasantly confused Supple Neck,No JVD,  Symmetrical Chest wall movement, Good air movement bilaterally, CTAB RRR,No Gallops,Rubs or new Murmurs, No Parasternal Heave +ve B.Sounds, Abd Soft, No tenderness, No Cyanosis, Clubbing or edema, No new Rash or bruise      Data Review:    CBC  Recent Labs Lab 03/11/16 1919 03/11/16 1938 03/12/16 0825  WBC 13.3*  --  11.6*  HGB 15.3* 16.3*  16.3* 13.9  HCT 44.9 48.0*  48.0* 41.0  PLT 239  --  205  MCV 90.0  --  90.1  MCH 30.7  --  30.5  MCHC 34.1  --  33.9  RDW 13.7  --  13.5  LYMPHSABS 1.8  --   --   MONOABS 0.9  --   --   EOSABS 0.2  --   --   BASOSABS 0.1  --   --     Chemistries   Recent Labs Lab 03/11/16 1919 03/11/16 1938 03/11/16 2034 03/12/16 0640  NA 132* 134*  134*  --  137  K 7.5* 7.5*  7.5* 4.0 4.2  CL 102 104  104  --  104  CO2 22  --   --  23  GLUCOSE 98 103*  103*  --  96  BUN 26* 46*  46*  --   24*  CREATININE 0.96 0.90  0.90  --  0.87  CALCIUM 9.2  --   --  9.0  AST 49*  --   --   --   ALT 17  --   --   --   ALKPHOS 82  --   --   --   BILITOT 1.4*  --   --   --    ------------------------------------------------------------------------------------------------------------------  Recent Labs  03/12/16 0640  CHOL 158  HDL 38*  LDLCALC 107*  TRIG 66  CHOLHDL 4.2    No results found for: HGBA1C ------------------------------------------------------------------------------------------------------------------  Recent Labs  03/12/16 0640  TSH 4.247   ------------------------------------------------------------------------------------------------------------------ No results for input(s): VITAMINB12, FOLATE, FERRITIN, TIBC, IRON, RETICCTPCT in the last 72 hours.  Coagulation profile  Recent Labs Lab 03/11/16 1919  INR 1.02    No results for input(s): DDIMER in the last 72 hours.  Cardiac Enzymes No results for input(s): CKMB, TROPONINI, MYOGLOBIN in the last 168 hours.  Invalid input(s): CK ------------------------------------------------------------------------------------------------------------------ No results found for: BNP  Inpatient Medications  Scheduled Meds: . aspirin  300 mg  Rectal Daily   Or  . aspirin  325 mg Oral Daily  . [START ON 03/13/2016] cefTRIAXone (ROCEPHIN)  IV  1 g Intravenous Q24H  . [START ON 03/13/2016] levothyroxine  25 mcg Oral QAC breakfast   Continuous Infusions: PRN Meds:.acetaminophen **OR** acetaminophen  Micro Results No results found for this or any previous visit (from the past 240 hour(s)).  Radiology Reports Ct Angio Head W Or Wo Contrast  Result Date: 03/12/2016 CLINICAL DATA:  82 y/o F; slurred speech, right facial droop, and right upper extremity weakness. EXAM: CT ANGIOGRAPHY HEAD AND NECK TECHNIQUE: Multidetector CT imaging of the head and neck was performed using the standard protocol during bolus  administration of intravenous contrast. Multiplanar CT image reconstructions and MIPs were obtained to evaluate the vascular anatomy. Carotid stenosis measurements (when applicable) are obtained utilizing NASCET criteria, using the distal internal carotid diameter as the denominator. CONTRAST:  50 cc Isovue 370 COMPARISON:  03/11/2016 MRI head.  03/11/2016 CT head. FINDINGS: CTA NECK FINDINGS Aortic arch: Severe aortic atherosclerosis with predominantly fibro fatty plaque. Three vessel arch. Moderate 60% stenosis of left subclavian artery origin. Right carotid system: The common carotid artery short segment of fibro fatty plaque with minimal 30% stenosis. Mixed plaque of the bifurcation with proximal ICA moderate 60% stenosis. Fibro fatty plaque of upper cervical ICA with minimal stenosis. Left carotid system: Upper common carotid artery fibro fatty plaque with minimal stenosis. Dense calcified plaque of carotid bifurcation with severe at least 90% stenosis of proximal internal carotid artery. Downstream to the stenotic segment the internal carotid artery is diminutive in caliber and irregular with additional areas of mild-to-moderate stenosis. Vertebral arteries: Codominant. No evidence of dissection, stenosis (50% or greater) or occlusion. Bilateral V1 mild calcification. Skeleton: Extensive cervical spondylosis with upper cervical facet and multilevel endplate degenerative changes with disc space narrowing and large marginal osteophytes. Uncovertebral and facet hypertrophy results in mild to moderate foraminal narrowing and there is canal stenosis greatest at the C6-7 level where it is at least moderate Other neck: Negative. Upper chest: Negative. Review of the MIP images confirms the above findings CTA HEAD FINDINGS Anterior circulation: Left M2 superior division occlusion in the anterior sylvian fissure (Series 406 image 28 and 402 image 230). Extensive calcification of bilateral ICA cavernous and paraclinoid  segments. No significant stenosis of right ICA. Minimal opacification of left petrous ICA, minimal if at any opacification of left cavernous ICA, and reconstitution of left ICA level of terminus. Large right A1 and anterior communicating artery without appreciable left A1, normal variant. No high-grade stenosis of the proximal M1 branches. There is diminished contrast opacification in the left MCA distribution in comparison with the right. Left MCA is likely perfused primarily by a medium-sized posterior communicating artery. Short segment of moderate stenosis in the mid left posterior communicating artery. Posterior circulation: No significant stenosis, proximal occlusion, aneurysm, or vascular malformation of the bilateral vertebral arteries, basilar artery, and left posterior cerebral artery. Short segment of severe stenosis of the proximal right P2 segment. Venous sinuses: Limited contrast opacification. Anatomic variants: Large right A1 and anterior communicating artery without appreciable left A1, likely normal variant. Medium left posterior communicating artery. Possible diminutive right posterior communicating artery. Delayed phase: No abnormal intracranial enhancement. Left middle cranial fossa dural calcified mass, likely meningioma. Review of the MIP images confirms the above findings IMPRESSION: 1. Left M2 superior division occlusion in the anterior sylvian fissure. 2. Right carotid bifurcation mixed plaque with moderate 60% proximal ICA stenosis. 3.  Left carotid bifurcation calcified plaque with severe at least 90% proximal ICA stenosis. Diminutive caliber of left ICA downstream to stenotic lesion with minimal if any contrast opacification at level of cavernous segment. Reconstitution of left ICA at level of terminus. Left MCA circulation likely largely supplied by left posterior communicating artery. 4. Intracranial atherosclerosis with multiple areas of mild stenosis and short segment of severe  stenosis of right proximal P2 segment. 5. Severe atherosclerosis of aortic arch with predominantly fibro fatty plaque. These results will be called to the ordering clinician or representative by the Radiologist Assistant, and communication documented in the PACS or zVision Dashboard. Electronically Signed   By: Mitzi Hansen M.D.   On: 03/12/2016 14:01   Ct Head Wo Contrast  Result Date: 03/11/2016 CLINICAL DATA:  Confusion with slurred speech EXAM: CT HEAD WITHOUT CONTRAST TECHNIQUE: Contiguous axial images were obtained from the base of the skull through the vertex without intravenous contrast. COMPARISON:  07/25/2009, MRI 03/11/2016 FINDINGS: Brain: No large territorial infarction or intracranial hemorrhage is visualized. Minimal periventricular white matter hypodensities consistent with small vessel disease. Moderate-to-marked global atrophy. Ventricular enlargement felt secondary to atrophy. Subtle low-attenuation within the left insular cortex, best seen on coronal views and felt to correspond to some of the MRI diffusion abnormality. Stable calcified mass in the left middle cranial fossa. Vascular: No hyperdense vessels are visualized. Carotid artery calcifications. Skull: Mastoid air cells are clear.  There is no fracture. Sinuses/Orbits: Mild mucosal thickening within the sinuses. No acute orbital abnormality. Other: None IMPRESSION: 1. No evidence for acute intracranial hemorrhage. Subtle low-density foci within the left temporal lobe and insular cortex, felt to correspond to some of the MRI diffusion abnormality. 2. Moderate to marked atrophy. Electronically Signed   By: Jasmine Pang M.D.   On: 03/11/2016 22:13   Ct Angio Neck W Or Wo Contrast  Result Date: 03/12/2016 CLINICAL DATA:  82 y/o F; slurred speech, right facial droop, and right upper extremity weakness. EXAM: CT ANGIOGRAPHY HEAD AND NECK TECHNIQUE: Multidetector CT imaging of the head and neck was performed using the  standard protocol during bolus administration of intravenous contrast. Multiplanar CT image reconstructions and MIPs were obtained to evaluate the vascular anatomy. Carotid stenosis measurements (when applicable) are obtained utilizing NASCET criteria, using the distal internal carotid diameter as the denominator. CONTRAST:  50 cc Isovue 370 COMPARISON:  03/11/2016 MRI head.  03/11/2016 CT head. FINDINGS: CTA NECK FINDINGS Aortic arch: Severe aortic atherosclerosis with predominantly fibro fatty plaque. Three vessel arch. Moderate 60% stenosis of left subclavian artery origin. Right carotid system: The common carotid artery short segment of fibro fatty plaque with minimal 30% stenosis. Mixed plaque of the bifurcation with proximal ICA moderate 60% stenosis. Fibro fatty plaque of upper cervical ICA with minimal stenosis. Left carotid system: Upper common carotid artery fibro fatty plaque with minimal stenosis. Dense calcified plaque of carotid bifurcation with severe at least 90% stenosis of proximal internal carotid artery. Downstream to the stenotic segment the internal carotid artery is diminutive in caliber and irregular with additional areas of mild-to-moderate stenosis. Vertebral arteries: Codominant. No evidence of dissection, stenosis (50% or greater) or occlusion. Bilateral V1 mild calcification. Skeleton: Extensive cervical spondylosis with upper cervical facet and multilevel endplate degenerative changes with disc space narrowing and large marginal osteophytes. Uncovertebral and facet hypertrophy results in mild to moderate foraminal narrowing and there is canal stenosis greatest at the C6-7 level where it is at least moderate Other neck: Negative. Upper chest: Negative. Review  of the MIP images confirms the above findings CTA HEAD FINDINGS Anterior circulation: Left M2 superior division occlusion in the anterior sylvian fissure (Series 406 image 28 and 402 image 230). Extensive calcification of bilateral  ICA cavernous and paraclinoid segments. No significant stenosis of right ICA. Minimal opacification of left petrous ICA, minimal if at any opacification of left cavernous ICA, and reconstitution of left ICA level of terminus. Large right A1 and anterior communicating artery without appreciable left A1, normal variant. No high-grade stenosis of the proximal M1 branches. There is diminished contrast opacification in the left MCA distribution in comparison with the right. Left MCA is likely perfused primarily by a medium-sized posterior communicating artery. Short segment of moderate stenosis in the mid left posterior communicating artery. Posterior circulation: No significant stenosis, proximal occlusion, aneurysm, or vascular malformation of the bilateral vertebral arteries, basilar artery, and left posterior cerebral artery. Short segment of severe stenosis of the proximal right P2 segment. Venous sinuses: Limited contrast opacification. Anatomic variants: Large right A1 and anterior communicating artery without appreciable left A1, likely normal variant. Medium left posterior communicating artery. Possible diminutive right posterior communicating artery. Delayed phase: No abnormal intracranial enhancement. Left middle cranial fossa dural calcified mass, likely meningioma. Review of the MIP images confirms the above findings IMPRESSION: 1. Left M2 superior division occlusion in the anterior sylvian fissure. 2. Right carotid bifurcation mixed plaque with moderate 60% proximal ICA stenosis. 3. Left carotid bifurcation calcified plaque with severe at least 90% proximal ICA stenosis. Diminutive caliber of left ICA downstream to stenotic lesion with minimal if any contrast opacification at level of cavernous segment. Reconstitution of left ICA at level of terminus. Left MCA circulation likely largely supplied by left posterior communicating artery. 4. Intracranial atherosclerosis with multiple areas of mild stenosis and  short segment of severe stenosis of right proximal P2 segment. 5. Severe atherosclerosis of aortic arch with predominantly fibro fatty plaque. These results will be called to the ordering clinician or representative by the Radiologist Assistant, and communication documented in the PACS or zVision Dashboard. Electronically Signed   By: Mitzi Hansen M.D.   On: 03/12/2016 14:01   Mr Brain Wo Contrast  Result Date: 03/11/2016 CLINICAL DATA:  Initial evaluation for acute right facial droop. EXAM: MRI HEAD WITHOUT CONTRAST TECHNIQUE: Multiplanar, multiecho pulse sequences of the brain and surrounding structures were obtained without intravenous contrast. COMPARISON:  Prior head CT from earlier the same day. FINDINGS: Brain: Diffuse prominence of the CSF containing spaces is compatible with generalized age-related cerebral atrophy. Patchy T2/FLAIR hyperintensity within the periventricular and deep white matter most compatible chronic microvascular ischemic changes, mild for age. There is patchy restricted diffusion involving the cortical gray matter of the left insular cortex and left frontal operculum, compatible with acute left MCA territory infarct. Minimal patchy cortical infarct more superiorly within the left posterior frontal lobe. No associated hemorrhage or mass effect. Minimal susceptibility artifact within the left sylvian fissure may reflect intraluminal thrombus within distal left MCA vasculature. No other evidence for acute infarct. Gray-white matter differentiation otherwise maintained. No other evidence for acute or chronic intracranial hemorrhage. Densely calcified 2 cm meningioma overlies the left temporal lobe. No associated edema. The no other mass lesion. No midline shift or mass effect. Ventricular prominence related to global parenchymal volume loss without hydrocephalus. No extra-axial fluid collection. Major dural sinuses are patent. Pituitary gland within normal limits. Vascular:  Abnormal flow void within the left ICA, which may be related to slow flow and/ or  occlusion (series 6, image 8). Major intracranial vascular flow voids otherwise maintained. Skull and upper cervical spine: Craniocervical junction normal. Degenerative spondylolysis noted within the visualized upper cervical spine without significant stenosis. Bone marrow signal intensity normal. No scalp soft tissue abnormality. Sinuses/Orbits: Globes and orbital soft tissues within normal limits. Patient is status post cataract extraction bilaterally. Mild scattered mucosal thickening within the ethmoidal air cells and maxillary sinuses. No air-fluid level to suggest active sinus infection. Small right mastoid effusion. Inner ear structures grossly normal. IMPRESSION: 1. Small volume acute ischemic left MCA cortical infarct involving the left insula and frontal operculum. No associated hemorrhage or mass effect. 2. Abnormal flow void within the left ICA, suspected to be occluded. This could be further evaluated with dedicated CTA. 3. 2 cm densely calcified meningioma overlying the left temporal lobe without associated edema. 4. Generalized age-related cerebral atrophy with mild chronic small vessel ischemic disease. Electronically Signed   By: Rise MuBenjamin  McClintock M.D.   On: 03/11/2016 22:28   Dg Chest Port 1 View  Result Date: 03/11/2016 CLINICAL DATA:  Slurred speech history of dementia EXAM: PORTABLE CHEST 1 VIEW COMPARISON:  08/19/2009 FINDINGS: Semi-erect portable view chest demonstrates low lung volumes and elevation of the right diaphragm. There is no focal infiltrate or effusion. Cardiomediastinal silhouette stable with borderline enlargement of the heart size. Atherosclerosis. No pneumothorax. IMPRESSION: 1. Borderline to mild cardiomegaly without overt failure 2. Atherosclerosis of the aorta 3. Low lung volumes.  No focal infiltrate Electronically Signed   By: Jasmine PangKim  Fujinaga M.D.   On: 03/11/2016 20:49    Time Spent  in minutes  : No Sherald Hessharge   Reeve Turnley M.D on 03/12/2016 at 2:34 PM  Between 7am to 7pm - Pager - [phone]  After 7pm go to [email or web address] - password [email or web address]  [facility] -  Office  [phone]

## 2016-03-12 NOTE — Progress Notes (Signed)
**  Preliminary report by tech**  Bilateral lower extremity venous duplex completed. There is no evidence of deep or superficial vein thrombosis involving the right and left lower extremities. All visualized vessels appear patent and compressible. There is no evidence of Baker's cysts bilaterally.  Carotid artery duplex completed. Based upon plaque morphology with systolic velocities: Findings are consistent with a 60 - 79 percent stenosis involving the right internal carotid artery.  Findings are consistent with an 80-99 percent stenosis involving the left internal carotid artery.  Results were given to the patient's nurse, Marissa.  03/12/16 5:02 PM Olen CordialGreg Lenzie Sandler RVT

## 2016-03-12 NOTE — Progress Notes (Signed)
STROKE TEAM PROGRESS NOTE   HISTORY OF PRESENT ILLNESS (per record) Dianah Fieldlizabeth N Erisman is an [age] y.o. female who presents via EMS from [facility] with new onset slurred speech, right facial droop and RUE weakness. Initially noted at 1600 on 11/6 but time last normal was unknown. She has a history of dementia.   MRI performed here revealed the following: Small volume acute ischemic left MCA cortical infarct involving the left insula and frontal operculum, seen in association with an abnormal flow void within the left ICA, suspected to be occluded. There is a 2 cm densely calcified meningioma overlying the left temporal lobe without associated edema. Prominent generalized age-related cerebral atrophy with mild chronic smallvessel ischemic disease is also noted.  Patient was not administered IV t-PA secondary to unknown LKW. She was admitted for further evaluation and treatment.   SUBJECTIVE (INTERVAL HISTORY) No family is at bedside. Pt is sitting in chair, seems to have aphasia but able to get some words out intermittently but also severe dysarthria. CTA head and neck showed left ICA high grade stenosis. VVS consulted.    OBJECTIVE Temp:  [97.6 F (36.4 C)-98.1 F (36.7 C)] 97.6 F (36.4 C) (11/07 0530) Pulse Rate:  [50-66] 50 (11/07 0530) Cardiac Rhythm: Sinus bradycardia (11/07 0128) Resp:  [16-23] 18 (11/07 0530) BP: (133-182)/(42-76) 153/48 (11/07 0530) SpO2:  [97 %-100 %] 100 % (11/07 0530) Weight:  [54.4 kg (120 lb)-54.9 kg (121 lb)] 54.9 kg (121 lb) (11/07 0128)  CBC:  Recent Labs Lab 03/11/16 1919 03/11/16 1938 03/12/16 0825  WBC 13.3*  --  PENDING  NEUTROABS 10.4*  --   --   HGB 15.3* 16.3* 13.9  HCT 44.9 48.0* 41.0  MCV 90.0  --  90.1  PLT 239  --  205    Basic Metabolic Panel:  Recent Labs Lab 03/11/16 1919 03/11/16 1938 03/11/16 2034 03/12/16 0640  NA 132* 134*  --  137  K 7.5* 7.5* 4.0 4.2  CL 102 104  --  104  CO2 22  --   --  23  GLUCOSE 98 103*   --  96  BUN 26* 46*  --  24*  CREATININE 0.96 0.90  --  0.87  CALCIUM 9.2  --   --  9.0    Lipid Panel:    Component Value Date/Time   CHOL 158 03/12/2016 0640   TRIG 66 03/12/2016 0640   HDL 38 (L) 03/12/2016 0640   CHOLHDL 4.2 03/12/2016 0640   VLDL 13 03/12/2016 0640   LDLCALC 107 (H) 03/12/2016 0640   HgbA1c: No results found for: HGBA1C Urine Drug Screen: No results found for: LABOPIA, COCAINSCRNUR, LABBENZ, AMPHETMU, THCU, LABBARB    IMAGING I have personally reviewed the radiological images below and agree with the radiology interpretations.  Ct Head Wo Contrast 03/11/2016 1. No evidence for acute intracranial hemorrhage. Subtle low-density foci within the left temporal lobe and insular cortex, felt to correspond to some of the MRI diffusion abnormality. 2. Moderate to marked atrophy.   Mr Brain Wo Contrast 03/11/2016 1. Small volume acute ischemic left MCA cortical infarct involving the left insula and frontal operculum. No associated hemorrhage or mass effect. 2. Abnormal flow void within the left ICA, suspected to be occluded. This could be further evaluated with dedicated CTA. 3. 2 cm densely calcified meningioma overlying the left temporal lobe without associated edema. 4. Generalized age-related cerebral atrophy with mild chronic small vessel ischemic disease.   [facility] [address]  03/11/2016 1. Borderline to mild cardiomegaly without overt failure 2. Atherosclerosis of the aorta 3. Low lung volumes.  No focal infiltrate   Ct Angio Head and neck W Or Wo Contrast 03/12/2016 IMPRESSION: 1. Left M2 superior division occlusion in the anterior sylvian fissure. 2. Right carotid bifurcation mixed plaque with moderate 60% proximal ICA stenosis. 3. Left carotid bifurcation calcified plaque with severe at least 90% proximal ICA stenosis. Diminutive caliber of left ICA downstream to stenotic lesion with minimal if any contrast opacification at level of cavernous segment.  Reconstitution of left ICA at level of terminus. Left MCA circulation likely largely supplied by left posterior communicating artery. 4. Intracranial atherosclerosis with multiple areas of mild stenosis and short segment of severe stenosis of right proximal P2 segment. 5. Severe atherosclerosis of aortic arch with predominantly fibro fatty plaque.  CUS - Findings are consistent with a 60 - 79 percent stenosis involving the right internal carotid artery.  Findings are consistent with an 80-99 percent stenosis involving the left internal carotid artery.   LE venous doppler - There is no evidence of deep or superficial vein thrombosis involving the right and left lower extremities. All visualized vessels appear patent and compressible. There is no evidence of Baker's cysts bilaterally.   PHYSICAL EXAM  Temp:  [97.6 F (36.4 C)-98.3 F (36.8 C)] 98.2 F (36.8 C) (11/07 2149) Pulse Rate:  [50-56] 56 (11/07 2149) Resp:  [16-23] 20 (11/07 2149) BP: (133-158)/(38-76) 150/62 (11/07 2149) SpO2:  [98 %-100 %] 99 % (11/07 2149) Weight:  [121 lb (54.9 kg)] 121 lb (54.9 kg) (11/07 0128)  General - Well nourished, well developed, in no apparent distress.  Ophthalmologic - Fundi not visualized due to noncooperation.  Cardiovascular - Regular rate and rhythm.  Neuro - awake alert, expressive aphasia, intermittently words coming out with severe dysarthria. Following simple commands centrally and peripherally. Not able to repeat but name 2/4. PERRL, EOMI, blinking to visual threat bilaterally. Right facial mild droop, tongue in the middle. Moving all extremities symmetrically. DTR 1+ and no babinski. Sensation symmetrical although limited exam, coordination and gait not cooperative.    ASSESSMENT/PLAN Ms. Dianah Fieldlizabeth N Borjon is a [age] y.o. female with history of dementia, HTN, hypothyroidism, and CKD presenting with slurred speech, R facial droop and RUE weakness. She did not receive IV t-PA due to unknown  LKW.   Stroke:  Dominant left MCA cortical infarct in setting of possible L ICA near occlusion, infarct embolic secondary to large vessel disease source  Resultant  Expressive aphasia with right facial droop  MRI  Small L MCA cortical infarct L insula and frontal operculum. L ICA abnormal flow void.  CTA head and neck  Left ICA near occlusion and right ICA 60% stenosis. Left ICA reconstitution at terminus. Diffuse athero  CUS - left ICA 80-99% and left ICA 60-79% stenosis  2D Echo  pending  LE venous doppler - no DVT  LDL 107  HgbA1c pending  SCDs for VTE prophylaxis  Diet NPO time specified  No antithrombotic prior to admission, now on aspirin 325 mg daily. Recommend ASA and plavix for 3 months and then plavix alone.   Patient counseled to be compliant with her antithrombotic medications  Ongoing aggressive stroke risk factor management  Therapy recommendations:  pending   Disposition:  pending  (admitted from [facility])  Carotid stenosis bilateral  CTA head and neck  Left ICA near occlusion and right ICA 60% stenosis. Left ICA reconstitution at terminus. Diffuse athero  CUS - left ICA 80-99% and left ICA 60-79% stenosis  VVS consult recommend medical treatment given advance age, dementia, and debilitated state  Continue DAPT for 3 months and then plavix alone  Hypertension  Stable Permissive hypertension (OK if < 220/120) but gradually normalize in 5-7 days Long-term BP goal 130-150 due to left ICA near occlusion  Hyperlipidemia  Home meds:  No statin  LDL 107, goal < 70  Add lipitor 20mg   Continue statin at discharge  Other Stroke Risk Factors  Advanced age  Other Active Problems  UTI on rocephin, cx pending   L temporal lobe meningioma  Baseline dementia  CKD  Hypothyroidism, on synthroid  Hospital day # 1  Neurology will sign off. Please call with questions. Pt will follow up with Darrol Angel NP at [facility] in about 6 weeks.  Thanks for the consult.  Marvel Plan, MD PhD Stroke Neurology 03/12/2016 11:33 PM   To contact Stroke Continuity provider, please refer to [email or web address]. After hours, contact General Neurology

## 2016-03-12 NOTE — Evaluation (Signed)
Clinical/Bedside Swallow Evaluation Patient Details  Name: Shannon Gomez MRN: [id number] Date of Birth: 02/24/1914  Today's Date: 03/12/2016 Time: SLP Start Time (ACUTE ONLY): 1140 SLP Stop Time (ACUTE ONLY): 1200 SLP Time Calculation (min) (ACUTE ONLY): 20 min  Past Medical History:  Past Medical History:  Diagnosis Date  . Chronic kidney disease   . Chronic pain   . COLONIC POLYPS, HX OF 01/01/2008   Qualifier: Diagnosis of  By: Andrey CampanileWilson MD, Raliegh IpLauraLee    . Dementia   . DIVERTICULITIS, HX OF 01/01/2008   Qualifier: Diagnosis of  By: Andrey CampanileWilson MD, Raliegh IpLauraLee    . GASTRIC ULCER 01/01/2008   Qualifier: Diagnosis of  By: Andrey CampanileWilson MD, Raliegh IpLauraLee    . GERD (gastroesophageal reflux disease)   . HCAP (healthcare-associated pneumonia) 05/17/2013  . Hypertension   . Thyroid disease   . Vitamin D deficiency    Past Surgical History: History reviewed. No pertinent surgical history. HPI:  Shannon Awkwardlizabeth N Robertsis a 82 y.o.woman with a history of dementia, HTN, hypothyroidism, and CKD who was referred to the ED tonight for evaluation of new slurred speech with right facial droop and right upper extremity weakness. Time that she was last known normal is unclear. She is alone in the ED and has an obvious expressive aphasia. I spoke to her nephew by phone, but he could not give any additional history (particularly regarding pre-existing conditions because he has only been involved in her care for the past 10 years). She has slurred speech and word finding difficulty. She is only oriented to person at this time. STAT head CT showed subtle low density foci in the left temporal lobe and insular cortex. MRI confirmed acute ischemic Left Middle Cerebral Artery cortical infarct. Neurology consulted. She also has evidence of a UTI with U/A showing large leukocytes, TNTC WBC, and many bacteria. She has received IV Rocephin   Assessment / Plan / Recommendation Clinical Impression  Pt presents with mild aspiration  risk due to decreased cognitive status and inability to follow directions. Pt with good oral manipulation of soft soild bolus and no overt s/s of aspiration with trials of thin liquids. ST recommends conservative approach of dysphagia 2 diet with thin liquids and medicine whole in applesauce as tolerated. Educatin provided nursing.     Aspiration Risk  Mild aspiration risk    Diet Recommendation Dysphagia 2 (Fine chop);Thin liquid   Liquid Administration via: Cup Medication Administration: Whole meds with puree Supervision: Intermittent supervision to cue for compensatory strategies;Staff to assist with self feeding Compensations: Minimize environmental distractions;Slow rate;Small sips/bites Postural Changes: Seated upright at 90 degrees    Other  Recommendations Oral Care Recommendations: Oral care BID   Follow up Recommendations Skilled Nursing facility      Frequency and Duration min 2x/week  2 weeks       Prognosis Prognosis for Safe Diet Advancement: Good Barriers to Reach Goals: Cognitive deficits      Swallow Study   General Date of Onset: 03/11/16 HPI: Shannon Awkwardlizabeth N Robertsis a 82 y.o.woman with a history of dementia, HTN, hypothyroidism, and CKD who was referred to the ED tonight for evaluation of new slurred speech with right facial droop and right upper extremity weakness. Time that she was last known normal is unclear. She is alone in the ED and has an obvious expressive aphasia. I spoke to her nephew by phone, but he could not give any additional history (particularly regarding pre-existing conditions because he has only been involved in her care for  the past 10 years). She has slurred speech and word finding difficulty. She is only oriented to person at this time. STAT head CT showed subtle low density foci in the left temporal lobe and insular cortex. MRI confirmed acute ischemic Left Middle Cerebral Artery cortical infarct. Neurology consulted. She also has  evidence of a UTI with U/A showing large leukocytes, TNTC WBC, and many bacteria. She has received IV Rocephin Type of Study: Bedside Swallow Evaluation Previous Swallow Assessment:  (none in chart) Diet Prior to this Study: NPO Temperature Spikes Noted: No Respiratory Status: Room air History of Recent Intubation: No Behavior/Cognition: Alert;Confused;Agitated;Doesn't follow directions Oral Cavity Assessment: Within Functional Limits Oral Care Completed by SLP: No Oral Cavity - Dentition: Adequate natural dentition Vision: Functional for self-feeding Self-Feeding Abilities: Needs assist Patient Positioning: Upright in chair Baseline Vocal Quality: Normal Volitional Cough: Strong Volitional Swallow: Able to elicit    Oral/Motor/Sensory Function Overall Oral Motor/Sensory Function: Within functional limits   Ice Chips Ice chips: Within functional limits Presentation: Spoon Other Comments: Pt spit out several trials of ice chips becasue she "didn't want them."   Thin Liquid Thin Liquid: Within functional limits Presentation: Spoon;Cup Other Comments: Mild right labial spillage but pt managed    Nectar Thick Nectar Thick Liquid: Not tested   Honey Thick Honey Thick Liquid: Not tested   Puree Puree: Within functional limits Presentation: Spoon   Solid   GO   Solid: Within functional limits Presentation: Spoon Other Comments: Soft solid trials - good oral clearing       Camary Sosa B. Lenard Gallowayverton, M.S., CCC-SLP Speech-Language Pathologist [phone] Ardit Danh 03/12/2016,2:59 PM

## 2016-03-12 NOTE — Evaluation (Signed)
Physical Therapy Evaluation Patient Details Name: Dianah Fieldlizabeth N Helmuth MRN: [id number] DOB: 07/01/1913 Today's Date: 03/12/2016   History of Present Illness  Pt is a 82 year old female who presents with RUE weakness and R facial droop from [facility]. Imaging revealed ischemic L MCA infarct. PMH also includes dementia.   Clinical Impression  Pt admitted with above diagnosis. Pt currently with functional limitations due to the deficits listed below (see PT Problem List). At the time of PT eval pt was able to perform transfers with +2 assist for balance support. Strength and coordination was not able to be assessed during session due to cognition. Pt will benefit from skilled PT to increase their independence and safety with mobility to allow discharge to the venue listed below.       Follow Up Recommendations SNF;Supervision/Assistance - 24 hour    Equipment Recommendations  None recommended by PT    Recommendations for Other Services       Precautions / Restrictions Precautions Precautions: Fall Restrictions Weight Bearing Restrictions: No      Mobility  Bed Mobility Overal bed mobility: Needs Assistance Bed Mobility: Supine to Sit     Supine to sit: Mod assist     General bed mobility comments: Assist for R side movement to EOB.   Transfers Overall transfer level: Needs assistance Equipment used: 2 person hand held assist Transfers: Stand Pivot Transfers   Stand pivot transfers: Max assist;+2 physical assistance       General transfer comment: VC's for hand placement on seated surface for safety. Pt became anxious when trasfer was initiated however was able to complete transfer to chair with +2 assist.   Ambulation/Gait                Stairs            Wheelchair Mobility    Modified Rankin (Stroke Patients Only) Modified Rankin (Stroke Patients Only) Pre-Morbid Rankin Score: Moderate disability Modified Rankin: Moderately severe disability      Balance Overall balance assessment: Needs assistance Sitting-balance support: Feet supported;No upper extremity supported Sitting balance-Leahy Scale: Fair     Standing balance support: Bilateral upper extremity supported;During functional activity Standing balance-Leahy Scale: Poor                               Pertinent Vitals/Pain Pain Assessment: Faces Faces Pain Scale: Hurts little more Pain Location: Where gait belt was sitting during transfer Pain Descriptors / Indicators: Discomfort Pain Intervention(s): Limited activity within patient's tolerance;Monitored during session;Repositioned    Home Living Family/patient expects to be discharged to:: Skilled nursing facility                      Prior Function           Comments: Pt unable to answer history questions however it is likely that pt required assist from staff for ADL's at baseline.      Hand Dominance        Extremity/Trunk Assessment   Upper Extremity Assessment: Defer to OT evaluation           Lower Extremity Assessment: Generalized weakness;Difficult to assess due to impaired cognition      Cervical / Trunk Assessment: Kyphotic  Communication   Communication: HOH (Difficult to understand at times)  Cognition Arousal/Alertness: Awake/alert Behavior During Therapy: Agitated (At times) Overall Cognitive Status: History of cognitive impairments - at baseline (Dementia at  baseline)                      General Comments      Exercises     Assessment/Plan    PT Assessment Patient needs continued PT services  PT Problem List Decreased strength;Decreased range of motion;Decreased activity tolerance;Decreased mobility;Decreased balance;Decreased knowledge of use of DME;Decreased safety awareness;Decreased knowledge of precautions;Pain          PT Treatment Interventions DME instruction;Gait training;Stair training;Functional mobility training;Therapeutic  activities;Therapeutic exercise;Neuromuscular re-education;Patient/family education    PT Goals (Current goals can be found in the Care Plan section)  Acute Rehab PT Goals Patient Stated Goal: Pt did not state goals PT Goal Formulation: Patient unable to participate in goal setting Time For Goal Achievement: 03/26/16 Potential to Achieve Goals: Fair    Frequency Min 3X/week   Barriers to discharge        Co-evaluation               End of Session Equipment Utilized During Treatment: Gait belt Activity Tolerance: Patient tolerated treatment well Patient left: in chair;with call bell/phone within reach;with chair alarm set Nurse Communication: Mobility status         Time: 0942-1000 PT Time Calculation (min) (ACUTE ONLY): 18 min   Charges:   PT Evaluation $PT Eval Moderate Complexity: 1 Procedure     PT G Codes:        Conni SlipperKirkman, Anaeli Cornwall 03/12/2016, 12:42 PM   Conni SlipperLaura Ganesh Deeg, PT, DPT Acute Rehabilitation Services Pager: [phone]

## 2016-03-12 NOTE — Evaluation (Signed)
Speech Language Pathology Evaluation Patient Details Name: Shannon Gomez MRN: [id number] DOB: 08/13/1913 Today's Date: 03/12/2016 Time: 1125-1140 SLP Time Calculation (min) (ACUTE ONLY): 15 min  Problem List:  Patient Active Problem List   Diagnosis Date Noted  . Stroke (cerebrum) (HCC) 03/12/2016  . Stroke (HCC) 03/11/2016  . Seborrheic dermatitis of scalp 07/11/2015  . Bradycardia 06/22/2015  . COPD (chronic obstructive pulmonary disease) (HCC) 04/30/2015  . Vitamin D deficiency 04/30/2015  . Chronic pain 08/01/2014  . Senile osteoporosis 08/01/2014  . Dry mouth 08/01/2014  . Xerophthalmia 12/16/2013  . Essential hypertension, benign 11/29/2013  . FTT (failure to thrive) in adult 05/30/2013  . Dementia without behavioral disturbance 04/27/2013  . Edema 12/23/2012  . Constipation 12/23/2012  . Hypothyroidism 01/01/2008  . DEPRESSION 01/01/2008  . GLAUCOMA 01/01/2008  . CAD 01/01/2008  . ABNORMAL HEART RHYTHMS 01/01/2008  . ALLERGIC RHINITIS 01/01/2008  . COPD 01/01/2008  . GERD 01/01/2008  . Osteoarthritis 01/01/2008  . INSOMNIA 01/01/2008   Past Medical History:  Past Medical History:  Diagnosis Date  . Chronic kidney disease   . Chronic pain   . COLONIC POLYPS, HX OF 01/01/2008   Qualifier: Diagnosis of  By: Andrey CampanileWilson MD, Raliegh IpLauraLee    . Dementia   . DIVERTICULITIS, HX OF 01/01/2008   Qualifier: Diagnosis of  By: Andrey CampanileWilson MD, Raliegh IpLauraLee    . GASTRIC ULCER 01/01/2008   Qualifier: Diagnosis of  By: Andrey CampanileWilson MD, Raliegh IpLauraLee    . GERD (gastroesophageal reflux disease)   . HCAP (healthcare-associated pneumonia) 05/17/2013  . Hypertension   . Thyroid disease   . Vitamin D deficiency    Past Surgical History: History reviewed. No pertinent surgical history. HPI:  Shannon Awkwardlizabeth N Robertsis a 82 y.o.woman with a history of dementia, HTN, hypothyroidism, and CKD who was referred to the ED tonight for evaluation of new slurred speech with right facial droop and right upper extremity  weakness. Time that she was last known normal is unclear. She is alone in the ED and has an obvious expressive aphasia. I spoke to her nephew by phone, but he could not give any additional history (particularly regarding pre-existing conditions because he has only been involved in her care for the past 10 years). She has slurred speech and word finding difficulty. She is only oriented to person at this time. STAT head CT showed subtle low density foci in the left temporal lobe and insular cortex. MRI confirmed acute ischemic Left Middle Cerebral Artery cortical infarct. Neurology consulted. She also has evidence of a UTI with U/A showing large leukocytes, TNTC WBC, and many bacteria. She has received IV Rocephin   Assessment / Plan / Recommendation Clinical Impression  Pt present with moderate confusion d/t PMH of dementia. However pt with mild expressive aphasia c/b by word finding deficits with new infomration at the phrase and sentence level. Pt would benefit from skilled ST to target increasing pt's ability to express her wants and needs.     SLP Assessment       Follow Up Recommendations  Skilled Nursing facility    Frequency and Duration min 2x/week  2 weeks      SLP Evaluation Cognition  Overall Cognitive Status: History of cognitive impairments - at baseline Arousal/Alertness: Awake/alert Orientation Level: Oriented to person;Disoriented to place;Disoriented to time;Disoriented to situation Attention: Focused Focused Attention: Appears intact Memory: Impaired Memory Impairment: Storage deficit;Retrieval deficit;Decreased recall of new information;Decreased long term memory;Decreased short term memory Decreased Long Term Memory: Verbal basic;Functional basic Decreased Short  Term Memory: Verbal basic;Functional basic Awareness: Impaired Awareness Impairment: Intellectual impairment Problem Solving: Impaired Problem Solving Impairment: Verbal basic;Functional  basic Behaviors: Impulsive;Verbal agitation;Poor frustration tolerance Safety/Judgment: Impaired       Comprehension  Auditory Comprehension Overall Auditory Comprehension: Impaired at baseline Yes/No Questions: Impaired Basic Biographical Questions: 0-25% accurate Commands: Impaired Visual Recognition/Discrimination Discrimination: Not tested Reading Comprehension Reading Status: Not tested    Expression Expression Primary Mode of Expression: Verbal Verbal Expression Overall Verbal Expression: Impaired Initiation: No impairment Automatic Speech: Name;Social Response Level of Generative/Spontaneous Verbalization: Phrase Repetition: Impaired Naming: Impairment Responsive: 0-25% accurate Confrontation: Not tested Verbal Errors: Not aware of errors;Neologisms Pragmatics: Impairment Impairments: Interpretation of nonverbal communication (Probably baseline) Interfering Components: Premorbid deficit Non-Verbal Means of Communication: Not applicable Written Expression Written Expression: Not tested   Oral / Motor  Oral Motor/Sensory Function Overall Oral Motor/Sensory Function: Within functional limits Motor Speech Overall Motor Speech: Appears within functional limits for tasks assessed Respiration: Within functional limits Phonation: Normal Resonance: Within functional limits Articulation: Within functional limitis Intelligibility: Intelligible (Impacted by expressive aphasia) Motor Planning: Witnin functional limits Motor Speech Errors: Not applicable Interfering Components: Premorbid status   GO            Luke Falero B. Dreama Saaverton, M.S., CCC-SLP Speech-Language Pathologist          Sheree Lalla 03/12/2016, 3:21 PM

## 2016-03-13 ENCOUNTER — Other Ambulatory Visit (HOSPITAL_COMMUNITY): Payer: Medicare Other

## 2016-03-13 DIAGNOSIS — I63232 Cerebral infarction due to unspecified occlusion or stenosis of left carotid arteries: Secondary | ICD-10-CM

## 2016-03-13 LAB — VAS US CAROTID
LCCADDIAS: 0 cm/s
LCCAPSYS: 60 cm/s
LEFT ECA DIAS: 0 cm/s
LEFT VERTEBRAL DIAS: -9 cm/s
LICADDIAS: -8 cm/s
LICADSYS: -125 cm/s
LICAPDIAS: -56 cm/s
LICAPSYS: -341 cm/s
Left CCA dist sys: 65 cm/s
Left CCA prox dias: 0 cm/s
RIGHT ECA DIAS: 0 cm/s
RIGHT VERTEBRAL DIAS: 13 cm/s
Right CCA prox dias: -7 cm/s
Right CCA prox sys: -59 cm/s
Right cca dist sys: -54 cm/s

## 2016-03-13 LAB — CBC
HCT: 41.7 % (ref 36.0–46.0)
HEMOGLOBIN: 13.8 g/dL (ref 12.0–15.0)
MCH: 30.3 pg (ref 26.0–34.0)
MCHC: 33.1 g/dL (ref 30.0–36.0)
MCV: 91.4 fL (ref 78.0–100.0)
PLATELETS: 219 10*3/uL (ref 150–400)
RBC: 4.56 MIL/uL (ref 3.87–5.11)
RDW: 13.7 % (ref 11.5–15.5)
WBC: 10.8 10*3/uL — ABNORMAL HIGH (ref 4.0–10.5)

## 2016-03-13 MED ORDER — CEPHALEXIN 500 MG PO CAPS
500.0000 mg | ORAL_CAPSULE | Freq: Two times a day (BID) | ORAL | Status: DC
  Administered 2016-03-13 – 2016-03-14 (×2): 500 mg via ORAL
  Filled 2016-03-13 (×2): qty 1

## 2016-03-13 NOTE — Progress Notes (Addendum)
Bilateral non-violent wrist restraint order expired.  Bilateral hand mitts applied as patient has met criteria for removal and is due for discharge to facility within next 24 hours.  Safety maintained.  Call bell within reach and bed in lowest functional position with bed alarms on most sensitive setting.  Discussed with Dr. Patel. 

## 2016-03-13 NOTE — Evaluation (Signed)
Occupational Therapy Evaluation Patient Details Name: Shannon Gomez MRN: [id number] DOB: 07/05/1913 Today's Date: 03/13/2016    History of Present Illness Pt is a 82 year old female who presents with RUE weakness and R facial droop [facility]. Imaging revealed ischemic L MCA infarct. PMH also includes dementia.    Clinical Impression   PT admitted with LMCA infarct. Pt currently with functional limitiations due to the deficits listed below (see OT problem list). PTA from SNF and plans to return to SNF.  Pt will benefit from skilled OT to increase their independence and safety with adls and balance to allow discharge SNF.     Follow Up Recommendations  SNF    Equipment Recommendations  3 in 1 bedside comode;Wheelchair (measurements OT);Wheelchair cushion (measurements OT);Hospital bed    Recommendations for Other Services       Precautions / Restrictions Precautions Precautions: Fall Precaution Comments: risk for skin break down due to incontinence      Mobility Bed Mobility Overal bed mobility: Needs Assistance;+2 for physical assistance Bed Mobility: Supine to Sit;Sit to Supine     Supine to sit: +2 for physical assistance;Max assist Sit to supine: +2 for physical assistance;Mod assist   General bed mobility comments: pt transfered to eob with helcopter method with pad. pt noted to be soiled and unaware. tp completed sit TO stand for bed linen change. pt peri care performed supine and sacral pad placed to decr risk for skin break down  Transfers Overall transfer level: Needs assistance Equipment used: 2 person hand held assist Transfers: Sit to/from Stand Sit to Stand: +2 physical assistance;Max assist         General transfer comment: pt able to initiate power up into standing but unable to sustain    Balance Overall balance assessment: Needs assistance Sitting-balance support: Bilateral upper extremity supported;Feet supported Sitting  balance-Leahy Scale: Good Sitting balance - Comments: pt static sitting min (A) but required cues for anterior weight shift.    Standing balance support: Bilateral upper extremity supported;During functional activity Standing balance-Leahy Scale: Zero                              ADL Overall ADL's : Needs assistance/impaired Eating/Feeding: Set up;Bed level Eating/Feeding Details (indicate cue type and reason): HOB elevated and tray completing setup. pt educated on all items and asked to locate them to ensure patient could scan entire tray                           Toileting - Clothing Manipulation Details (indicate cue type and reason): incontinence on arrival supine       General ADL Comments: pt incontinent on arrival and session focused on bed mobility with hygiene . pt sit<>Stand from bed and return to supine to eat lunch. pt with HOB fully up for self feeding     Vision     Perception     Praxis      Pertinent Vitals/Pain Pain Assessment: No/denies pain     Hand Dominance Right   Extremity/Trunk Assessment Upper Extremity Assessment Upper Extremity Assessment: Generalized weakness   Lower Extremity Assessment Lower Extremity Assessment: Defer to PT evaluation   Cervical / Trunk Assessment Cervical / Trunk Assessment: Kyphotic   Communication Communication Communication: HOH   Cognition Arousal/Alertness: Awake/alert Behavior During Therapy: WFL for tasks assessed/performed Overall Cognitive Status: No family/caregiver present to determine  baseline cognitive functioning                     General Comments       Exercises       Shoulder Instructions      Home Living Family/patient expects to be discharged to:: Skilled nursing facility                                        Prior Functioning/Environment          Comments: unable to provide history. pt just repeats you are so nice        OT  Problem List: Decreased strength;Decreased activity tolerance;Impaired vision/perception;Impaired balance (sitting and/or standing);Decreased safety awareness;Decreased knowledge of use of DME or AE;Decreased knowledge of precautions;Cardiopulmonary status limiting activity   OT Treatment/Interventions: Self-care/ADL training;Therapeutic exercise;Neuromuscular education;DME and/or AE instruction;Therapeutic activities;Cognitive remediation/compensation;Visual/perceptual remediation/compensation;Patient/family education;Balance training    OT Goals(Current goals can be found in the care plan section) Acute Rehab OT Goals Patient Stated Goal: Pt did not state goals OT Goal Formulation: Patient unable to participate in goal setting Time For Goal Achievement: 03/27/16 Potential to Achieve Goals: Good  OT Frequency: Min 2X/week   Barriers to D/C:            Co-evaluation              End of Session Equipment Utilized During Treatment: Gait belt Nurse Communication: Mobility status;Precautions  Activity Tolerance: Patient tolerated treatment well Patient left: in bed;with call bell/phone within reach;with bed alarm set   Time: 1320-1345 OT Time Calculation (min): 25 min Charges:  OT General Charges $OT Visit: 1 Procedure OT Evaluation $OT Eval High Complexity: 1 Procedure G-Codes:    Boone MasterJones, [name] B 03/13/2016, 2:41 PM  Mateo FlowJones, Brynn   OTR/L Pager: [phone]: [phone] Office: [phone] .

## 2016-03-13 NOTE — Progress Notes (Signed)
[facility] Progress Note  Patient: Shannon Gomez AVW:[id number]   PCP: Oneal GroutPANDEY, MAHIMA, MD DOB: 03/29/1914   DOA: 03/11/2016   DOS: 03/13/2016   Date of Service: the patient was seen and examined on 03/13/2016  Brief hospital course: Pt. with PMH of HTN, hypothyroidism, CVA; admitted on 03/11/2016, with complaint of slurred speech, was found to have CVA with acute encephalopathy. Currently further plan is continue monitoring.  Assessment and Plan: 1.Acute CVA - MRI brain showing Small L MCA cortical infarct L insula and frontal operculum. L ICA abnormal flow void. - CTA head and neck significant for right ICA 60% stenosis, left ICA 90% stenosis, vascular surgery consulted - 2-D echo pending - LDL is 107 - No antithrombotic prior to admission, currently on full dose aspirin  Bilateral ICA stenosis - CTA head and neck significant for right ICA 60% stenosis, left ICA 90% stenosis, vascular surgery consulted No workup and invasive treatment recommended by vascular surgery. Recommend medical treatment.  Hypertension - Allow for permissive hypertension  Hyperlipidemia - LDL is 107, on Lipitor  UTI Acute encephalopathy. Delirium due to UTI - Continue with Rocephin, follow on urine cultures If there is not improvement in patient's encephalopathy will initiate Seroquel.  Hypothyroidism - continue with Synthroid   Pain management: When necessary Tylenol Activity: SNF as per physical therapy Bowel regimen: last BM 03/12/2016 Diet: Dysphagia DVT Prophylaxis: subcutaneous Heparin  Advance goals of care discussion: DNR/DNI Family Communication: no family was present at bedside, at the time of interview.  Disposition:  Discharge to SNF. Expected discharge date: 03/14/2016, improvement in confusion  Consultants: Neurology Procedures: Echocardiogram, carotid Doppler, lower extremity Doppler  Antibiotics: Anti-infectives    Start     Dose/Rate Route Frequency Ordered  Stop   03/13/16 0000  cefTRIAXone (ROCEPHIN) 1 g in dextrose 5 % 50 mL IVPB     1 g 100 mL/hr over 30 Minutes Intravenous Every 24 hours 03/12/16 0434     03/11/16 2345  cefTRIAXone (ROCEPHIN) 1 g in dextrose 5 % 50 mL IVPB     1 g 100 mL/hr over 30 Minutes Intravenous  Once 03/11/16 2331 03/12/16 0058     Subjective: Incomprehensible speech, remains agitated overnight requiring medication.  Objective: Physical Exam: Vitals:   03/12/16 2149 03/13/16 0116 03/13/16 0648 03/13/16 1300  BP: (!) 150/62 (!) 145/57 (!) 160/48 (!) 163/40  Pulse: (!) 56 (!) 53 (!) 50 (!) 50  Resp: 20 20 20 18   Temp: 98.2 F (36.8 C) 98.1 F (36.7 C) 99.2 F (37.3 C) 98.6 F (37 C)  TempSrc: Oral Oral Oral Oral  SpO2: 99% 99% 100% 99%  Weight:      Height:       No intake or output data in the 24 hours ending 03/13/16 1853 Filed Weights   03/11/16 1824 03/12/16 0128  Weight: 54.4 kg (120 lb) 54.9 kg (121 lb)    General: Alert, Awake and Not following command Eyes: PERRL, Conjunctiva normal ENT: Oral Mucosa clear moist. Neck: difficult to assess JVD, no Abnormal Mass Or lumps Cardiovascular: S1 and S2 Present, aortic systolic Murmur, Respiratory: Bilateral Air entry equal and Decreased, no use of accessory muscle, Clear to Auscultation, no Crackles, no wheezes Abdomen: Bowel Sound present, Soft and no tenderness Skin: no redness, no Rash, no induration Extremities: no Pedal edema, no calf tenderness Neurologic: Examination is limited due to patient's lack of participation.  Data Reviewed: CBC:  Recent Labs Lab 03/11/16 1919 03/11/16 1938 03/12/16 0825 03/13/16 [id number]  WBC 13.3*  --  11.6* 10.8*  NEUTROABS 10.4*  --   --   --   HGB 15.3* 16.3*  16.3* 13.9 13.8  HCT 44.9 48.0*  48.0* 41.0 41.7  MCV 90.0  --  90.1 91.4  PLT 239  --  205 219   Basic Metabolic Panel:  Recent Labs Lab 03/11/16 1919 03/11/16 1938 03/11/16 2034 03/12/16 0640 03/12/16 1510  NA 132* 134*  134*  --   137 135  K 7.5* 7.5*  7.5* 4.0 4.2 4.3  CL 102 104  104  --  104 103  CO2 22  --   --  23 22  GLUCOSE 98 103*  103*  --  96 126*  BUN 26* 46*  46*  --  24* 24*  CREATININE 0.96 0.90  0.90  --  0.87 0.98  CALCIUM 9.2  --   --  9.0 9.0    Liver Function Tests:  Recent Labs Lab 03/11/16 1919  AST 49*  ALT 17  ALKPHOS 82  BILITOT 1.4*  PROT 6.8  ALBUMIN 3.4*   No results for input(s): LIPASE, AMYLASE in the last 168 hours. No results for input(s): AMMONIA in the last 168 hours. Coagulation Profile:  Recent Labs Lab 03/11/16 1919  INR 1.02   Cardiac Enzymes: No results for input(s): CKTOTAL, CKMB, CKMBINDEX, TROPONINI in the last 168 hours. BNP (last 3 results) No results for input(s): PROBNP in the last 8760 hours.  CBG: No results for input(s): GLUCAP in the last 168 hours.  Studies: No results found.   Scheduled Meds: . aspirin  300 mg Rectal Daily   Or  . aspirin  325 mg Oral Daily  . atorvastatin  20 mg Oral q1800  . cefTRIAXone (ROCEPHIN)  IV  1 g Intravenous Q24H  . clopidogrel  75 mg Oral Daily  . levothyroxine  25 mcg Oral QAC breakfast   Continuous Infusions: PRN Meds: acetaminophen **OR** acetaminophen  Time spent: 30 minutes  Author: Lynden OxfordPranav Tamatha Gadbois, MD [facility] Pager: [phone] 03/13/2016 6:53 PM  If 7PM-7AM, please contact night-coverage at [email or web address], password [email or web address]

## 2016-03-13 NOTE — Progress Notes (Signed)
   VASCULAR SURGERY ASSESSMENT & PLAN:  Carotid duplex confirms findings on CT scan. As per my consult note, given her age, history of dementia, and debilitated state I do not think that she is a good candidate for surgery. Likewise, she is not a good candidate for stenting for the same reasons and also because she has a very calcified aortic arch. I would treat her medically and would not recommend an aggressive approach to her stroke.   SUBJECTIVE: No complaints  PHYSICAL EXAM: Vitals:   03/12/16 1730 03/12/16 2149 03/13/16 0116 03/13/16 0648  BP: (!) 143/58 (!) 150/62 (!) 145/57 (!) 160/48  Pulse: (!) 53 (!) 56 (!) 53 (!) 50  Resp: 18 20 20 20   Temp: 97.9 F (36.6 C) 98.2 F (36.8 C) 98.1 F (36.7 C) 99.2 F (37.3 C)  TempSrc: Oral Oral Oral Oral  SpO2: 98% 99% 99% 100%  Weight:      Height:       Reasonable strength bilat.   LABS: Lab Results  Component Value Date   WBC 10.8 (H) 03/13/2016   HGB 13.8 03/13/2016   HCT 41.7 03/13/2016   MCV 91.4 03/13/2016   PLT 219 03/13/2016   Lab Results  Component Value Date   CREATININE 0.98 03/12/2016   Lab Results  Component Value Date   INR 1.02 03/11/2016   Principal Problem:   Stroke [facility](HCC) Active Problems:   Hypothyroidism   Essential hypertension, benign   Stroke (cerebrum) (HCC)   Bilateral carotid artery stenosis  Cari CarawayChris Deyana Wnuk Beeper: [phone] 03/13/2016

## 2016-03-13 NOTE — Progress Notes (Signed)
Speech Language Pathology Treatment: Dysphagia;Cognitive-Linquistic  Patient Details Name: Shannon Gomez MRN: [id number] DOB: 08/11/1913 Today's Date: 03/13/2016 Time: 1000-1030 SLP Time Calculation (min) (ACUTE ONLY): 30 min  Assessment / Plan / Recommendation Clinical Impression  Skilled treatment session focused on dysphagia and speech goals. SLP facilitated session by providing skilled observation of dysphagia 2 with thin liquids. Pt with mild oral prep time but functional with complete oral clearing with dysphagia 2 items. Pt consumed thin liquids via cup without overt s/s of aspiration. Pt able to imitate functional common phrases with decreased word finding difficulty. Continue current plan of care.    HPI HPI: Shannon Awkwardlizabeth N Robertsis a 82 y.o.woman with a history of dementia, HTN, hypothyroidism, and CKD who was referred to the ED tonight for evaluation of new slurred speech with right facial droop and right upper extremity weakness. Time that she was last known normal is unclear. She is alone in the ED and has an obvious expressive aphasia. I spoke to her nephew by phone, but he could not give any additional history (particularly regarding pre-existing conditions because he has only been involved in her care for the past 10 years). She has slurred speech and word finding difficulty. She is only oriented to person at this time. STAT head CT showed subtle low density foci in the left temporal lobe and insular cortex. MRI confirmed acute ischemic Left Middle Cerebral Artery cortical infarct. Neurology consulted. She also has evidence of a UTI with U/A showing large leukocytes, TNTC WBC, and many bacteria. She has received IV Rocephin      SLP Plan  Continue with current plan of care     Recommendations  Diet recommendations: Dysphagia 2 (fine chop);Thin liquid Liquids provided via: Cup Medication Administration: Whole meds with puree Supervision: Full supervision/cueing for  compensatory strategies;Staff to assist with self feeding Compensations: Minimize environmental distractions;Slow rate;Small sips/bites Postural Changes and/or Swallow Maneuvers: Seated upright 90 degrees                Oral Care Recommendations: Oral care BID Follow up Recommendations: Skilled Nursing facility Plan: Continue with current plan of care       GO           Shannon Gomez, M.S., CCC-SLP Speech-Language Pathologist [phone]   Shannon Sacco Dreama SaaOverton 03/13/2016, 10:36 AM

## 2016-03-14 ENCOUNTER — Inpatient Hospital Stay (HOSPITAL_COMMUNITY): Payer: Medicare Other

## 2016-03-14 DIAGNOSIS — E784 Other hyperlipidemia: Secondary | ICD-10-CM | POA: Diagnosis not present

## 2016-03-14 DIAGNOSIS — R4182 Altered mental status, unspecified: Secondary | ICD-10-CM | POA: Diagnosis not present

## 2016-03-14 DIAGNOSIS — N183 Chronic kidney disease, stage 3 (moderate): Secondary | ICD-10-CM | POA: Diagnosis not present

## 2016-03-14 DIAGNOSIS — I6789 Other cerebrovascular disease: Secondary | ICD-10-CM

## 2016-03-14 DIAGNOSIS — I69398 Other sequelae of cerebral infarction: Secondary | ICD-10-CM | POA: Diagnosis not present

## 2016-03-14 DIAGNOSIS — I69322 Dysarthria following cerebral infarction: Secondary | ICD-10-CM | POA: Diagnosis not present

## 2016-03-14 DIAGNOSIS — K5901 Slow transit constipation: Secondary | ICD-10-CM | POA: Diagnosis not present

## 2016-03-14 DIAGNOSIS — R278 Other lack of coordination: Secondary | ICD-10-CM | POA: Diagnosis not present

## 2016-03-14 DIAGNOSIS — I6523 Occlusion and stenosis of bilateral carotid arteries: Secondary | ICD-10-CM | POA: Diagnosis not present

## 2016-03-14 DIAGNOSIS — M6281 Muscle weakness (generalized): Secondary | ICD-10-CM | POA: Diagnosis not present

## 2016-03-14 DIAGNOSIS — I158 Other secondary hypertension: Secondary | ICD-10-CM | POA: Diagnosis not present

## 2016-03-14 DIAGNOSIS — M810 Age-related osteoporosis without current pathological fracture: Secondary | ICD-10-CM | POA: Diagnosis not present

## 2016-03-14 DIAGNOSIS — E039 Hypothyroidism, unspecified: Secondary | ICD-10-CM | POA: Diagnosis not present

## 2016-03-14 DIAGNOSIS — M150 Primary generalized (osteo)arthritis: Secondary | ICD-10-CM | POA: Diagnosis not present

## 2016-03-14 DIAGNOSIS — N3000 Acute cystitis without hematuria: Secondary | ICD-10-CM | POA: Diagnosis not present

## 2016-03-14 DIAGNOSIS — I63232 Cerebral infarction due to unspecified occlusion or stenosis of left carotid arteries: Secondary | ICD-10-CM | POA: Diagnosis not present

## 2016-03-14 DIAGNOSIS — D508 Other iron deficiency anemias: Secondary | ICD-10-CM | POA: Diagnosis not present

## 2016-03-14 DIAGNOSIS — I69320 Aphasia following cerebral infarction: Secondary | ICD-10-CM | POA: Diagnosis not present

## 2016-03-14 DIAGNOSIS — I69391 Dysphagia following cerebral infarction: Secondary | ICD-10-CM | POA: Diagnosis not present

## 2016-03-14 DIAGNOSIS — I639 Cerebral infarction, unspecified: Secondary | ICD-10-CM | POA: Diagnosis not present

## 2016-03-14 DIAGNOSIS — F0390 Unspecified dementia without behavioral disturbance: Secondary | ICD-10-CM | POA: Diagnosis not present

## 2016-03-14 DIAGNOSIS — I10 Essential (primary) hypertension: Secondary | ICD-10-CM | POA: Diagnosis not present

## 2016-03-14 LAB — HEMOGLOBIN A1C
Hgb A1c MFr Bld: 5.3 % (ref 4.8–5.6)
MEAN PLASMA GLUCOSE: 105 mg/dL

## 2016-03-14 LAB — BASIC METABOLIC PANEL
ANION GAP: 9 (ref 5–15)
BUN: 19 mg/dL (ref 6–20)
CALCIUM: 9.1 mg/dL (ref 8.9–10.3)
CO2: 26 mmol/L (ref 22–32)
CREATININE: 0.89 mg/dL (ref 0.44–1.00)
Chloride: 100 mmol/L — ABNORMAL LOW (ref 101–111)
GFR calc Af Amer: 60 mL/min — ABNORMAL LOW (ref >60)
GFR, EST NON AFRICAN AMERICAN: 51 mL/min — AB (ref >60)
GLUCOSE: 94 mg/dL (ref 65–99)
Potassium: 3.6 mmol/L (ref 3.5–5.1)
Sodium: 135 mmol/L (ref 135–145)

## 2016-03-14 LAB — CBC
HCT: 41.6 % (ref 36.0–46.0)
Hemoglobin: 14.4 g/dL (ref 12.0–15.0)
MCH: 31.2 pg (ref 26.0–34.0)
MCHC: 34.6 g/dL (ref 30.0–36.0)
MCV: 90.2 fL (ref 78.0–100.0)
PLATELETS: 234 10*3/uL (ref 150–400)
RBC: 4.61 MIL/uL (ref 3.87–5.11)
RDW: 13.4 % (ref 11.5–15.5)
WBC: 11.3 10*3/uL — AB (ref 4.0–10.5)

## 2016-03-14 LAB — ECHOCARDIOGRAM COMPLETE
HEIGHTINCHES: 60 in
WEIGHTICAEL: 1936 [oz_av]

## 2016-03-14 MED ORDER — CLOPIDOGREL BISULFATE 75 MG PO TABS: 75.0000 mg | ORAL_TABLET | Freq: Every day | ORAL | 0 refills | Status: DC

## 2016-03-14 MED ORDER — CEPHALEXIN 500 MG PO CAPS: 500.0000 mg | ORAL_CAPSULE | Freq: Two times a day (BID) | ORAL | 0 refills | Status: AC

## 2016-03-14 MED ORDER — ATORVASTATIN CALCIUM 20 MG PO TABS: 20.0000 mg | ORAL_TABLET | Freq: Every day | ORAL | 0 refills | Status: DC

## 2016-03-14 MED ORDER — AMLODIPINE BESYLATE 5 MG PO TABS: 2.5000 mg | ORAL_TABLET | Freq: Every day | ORAL | 0 refills | Status: AC

## 2016-03-14 MED ORDER — ASPIRIN 325 MG PO TABS: 325.0000 mg | ORAL_TABLET | Freq: Every day | ORAL | 0 refills | Status: DC

## 2016-03-14 NOTE — Care Management Note (Signed)
Case Management Note  Patient Details  Name: Dianah Fieldlizabeth N Brockwell MRN: [id number] Date of Birth: 05/11/1913  Subjective/Objective:                    Action/Plan: [facility]. No further needs per CM.   Expected Discharge Date:                  Expected Discharge Plan:  Skilled Nursing Facility  In-House Referral:  Clinical Social Work  Discharge planning Services     Post Acute Care Choice:    Choice offered to:     DME Arranged:    DME Agency:     HH Arranged:    HH Agency:     Status of Service:  Completed, signed off  If discussed at [employer] Meetings, dates discussed:    Additional Comments:  Kermit BaloKelli F Armour Villanueva, RN 03/14/2016, 10:31 AM

## 2016-03-14 NOTE — Discharge Summary (Addendum)
[facility] Discharge Summary   Patient: Shannon Gomez:[id number]   PCP: Shannon Grout, MD DOB: [birth year]   Date of admission: 03/11/2016   Date of discharge:  03/14/2016    Discharge Diagnoses:  Principal Problem:   Stroke [facility]) Active Problems:   Hypothyroidism   Essential hypertension, benign   Stroke (cerebrum) (HCC)   Bilateral carotid artery stenosis   Admitted From: snf Disposition:  snf  Recommendations for Outpatient Follow-up:  1. Please follow-up with PCP in one week. 2. Please follow-up with neurology in recommended time.  Follow-up Information    Shannon Riggs, NP. Schedule an appointment as soon as possible for a visit in 6 week(s).   Specialty:  Family Medicine Contact information: [address] [place] [address] [phone]        Shannon Grout, MD. Schedule an appointment as soon as possible for a visit in 1 week(s).   Specialty:  Internal Medicine Contact information: [address] [place] [address] [phone]          Diet recommendation: Dysphagia 2 (fine chop);Thin liquid Liquids provided via: Cup Medication Administration: Whole meds with puree Supervision: Full supervision/cueing for compensatory strategies;Staff to assist with self feeding Compensations: Minimize environmental distractions;Slow rate;Small sips/bites Postural Changes and/or Swallow Maneuvers: Seated upright 90 degrees  Activity: The patient is advised to gradually reintroduce usual activities.  Discharge Condition: good  Code Status: DNR DNI  History of present illness: As per the H and P dictated on admission, " Shannon Gomez is a 82 y.o. woman with a history of dementia, HTN, hypothyroidism, and CKD who was referred to the ED tonight for evaluation of new slurred speech with right facial droop and right upper extremity weakness.  Time that she was last known normal is unclear.  She is alone in the ED and has an  obvious expressive aphasia.  I spoke to her nephew by phone, but he could not give any additional history (particularly regarding pre-existing conditions because he has only been involved in her care for the past 10 years).  The patient is awake and alert but cannot clearly articulate what she wants to say.  She denies headache, chest pain, or shortness of breath.  She becomes frustrated when I attempt to perform ROS, stating "I am not sick".  She has slurred speech and word finding difficulty.  She is only oriented to person at this time."  Hospital Course:   Summary of her active problems in the hospital is as following. .Acute CVA - MRI brain showing Small L MCA cortical infarct L insula and frontal operculum. L ICA abnormal flow void. - CTA head and neck significant for right ICA 60% stenosis, left ICA 90% stenosis, vascular surgery consulted - LDLis 107, continue Lipitor 20 mg on discharge. - No antithrombotic prior to admission, currently on full dose aspirin and Plavix for 3 months after that Plavix alone.  Bilateral ICA stenosis - CTA head and neck significant for right ICA 60% stenosis, left ICA 90% stenosis, vascular surgery consulted No workup and invasive treatment recommended by vascular surgery. Recommend medical treatment. Blood pressure goal 130 to 150 systolic due to ICA occlusion.  Hypertension - Allow for permissive hypertension Reduce amlodipine to 2.5 mg daily.  Hyperlipidemia - LDL is 107, on Lipitor  UTI Acute metabolic encephalopathy. Delirium due to UTI CKD III  On Keflex, will complete 5 day treatment course.  Hypothyroidism - continue with Synthroid   All other chronic medical condition  were stable during the hospitalization.  Patient was seen by physical therapy, who recommended SNF, which was arranged by Child psychotherapist and case Production designer, theatre/television/film. On the day of the discharge the patient's vitals were stale, and no other acute medical condition were reported  by patient. the patient was felt safe to be discharge at SNF with therapy.  Procedures and Results:  Echocardiogram  Carotid Doppler  Lower extremity Doppler.   Consultations:  neurology  DISCHARGE MEDICATION: Current Discharge Medication List    START taking these medications   Details  aspirin 325 MG tablet Take 1 tablet (325 mg total) by mouth daily. Qty: 90 tablet, Refills: 0    atorvastatin (LIPITOR) 20 MG tablet Take 1 tablet (20 mg total) by mouth daily at 6 PM. Qty: 30 tablet, Refills: 0    cephALEXin (KEFLEX) 500 MG capsule Take 1 capsule (500 mg total) by mouth every 12 (twelve) hours. Qty: 3 capsule, Refills: 0    clopidogrel (PLAVIX) 75 MG tablet Take 1 tablet (75 mg total) by mouth daily. Qty: 60 tablet, Refills: 0      CONTINUE these medications which have CHANGED   Details  amLODipine (NORVASC) 5 MG tablet Take 0.5 tablets (2.5 mg total) by mouth daily. Qty: 10 tablet, Refills: 0      CONTINUE these medications which have NOT CHANGED   Details  Acetaminophen 325 MG CAPS Take 650 mg by mouth every 6 (six) hours as needed (pain).     calcium carbonate (OS-CAL) 600 MG TABS tablet Take 600 mg by mouth 2 (two) times daily with a meal.    ketoconazole (NIZORAL) 2 % shampoo Apply 1 application topically 2 (two) times a week. On bath days    levothyroxine (SYNTHROID, LEVOTHROID) 25 MCG tablet Take 25 mcg by mouth daily before breakfast.     OVER THE COUNTER MEDICATION Take 120 mLs by mouth daily. "medpass"-protein supplement given when taking meds    sennosides-docusate sodium (SENOKOT-S) 8.6-50 MG tablet Take 1 tablet by mouth at bedtime.     zinc oxide (BALMEX) 11.3 % CREA cream Apply 1 application topically. Every shift    UNABLE TO FIND Med Name: Med pass 120 mL daily       Allergies  Allergen Reactions  . Sulfonamide Derivatives Other (See Comments)    On MAR   Discharge Instructions    DIET DYS 2    Complete by:  As directed    Diet  recommendations: Dysphagia 2 (fine chop);Thin liquid Liquids provided via: Cup Medication Administration: Whole meds with puree Supervision: Full supervision/cueing for compensatory strategies;Staff to assist with self feeding Compensations: Minimize environmental distractions;Slow rate;Small sips/bites Postural Changes and/or Swallow Maneuvers: Seated upright 90 degrees   Fluid consistency:  Thin   Discharge instructions    Complete by:  As directed    It is important that you read following instructions as well as go over your medication list with RN to help you understand your care after this hospitalization.  Discharge Instructions: Please follow-up with PCP in one week  Please request your primary care physician to go over all Hospital Tests and Procedure/Radiological results at the follow up,  Please get all Hospital records sent to your PCP by signing hospital release before you go home.   Do not take more than prescribed Pain, Sleep and Anxiety Medications. You were cared for by a hospitalist during your hospital stay. If you have any questions about your discharge medications or the care you received while you were  in the hospital after you are discharged, you can call the unit and ask to speak with the hospitalist on call if the hospitalist that took care of you is not available.  Once you are discharged, your primary care physician will handle any further medical issues. Please note that NO REFILLS for any discharge medications will be authorized once you are discharged, as it is imperative that you return to your primary care physician (or establish a relationship with a primary care physician if you do not have one) for your aftercare needs so that they can reassess your need for medications and monitor your lab values. You Must read complete instructions/literature along with all the possible adverse reactions/side effects for all the Medicines you take and that have been  prescribed to you. Take any new Medicines after you have completely understood and accept all the possible adverse reactions/side effects. Wear Seat belts while driving.   Increase activity slowly    Complete by:  As directed      Discharge Exam: Filed Weights   03/11/16 1824 03/12/16 0128  Weight: 54.4 kg (120 lb) 54.9 kg (121 lb)   Vitals:   03/14/16 0539 03/14/16 0953  BP: (!) 169/51 (!) 152/59  Pulse: (!) 52 67  Resp: 18 18  Temp: 98 F (36.7 C) 99 F (37.2 C)   General: Appear in no distress, no Rash; Oral Mucosa moist. Cardiovascular: S1 and S2 Present, no Murmur, no JVD Respiratory: Bilateral Air entry present and Clear to Auscultation, no Crackles, no wheezes Abdomen: Bowel Sound present, Soft and no tenderness Extremities: no Pedal edema, no calf tenderness Neurology: Grossly no focal neuro deficit.  The results of significant diagnostics from this hospitalization (including imaging, microbiology, ancillary and laboratory) are listed below for reference.    Significant Diagnostic Studies: Ct Angio Head W Or Wo Contrast  Result Date: 03/12/2016 CLINICAL DATA:  82 y/o F; slurred speech, right facial droop, and right upper extremity weakness. EXAM: CT ANGIOGRAPHY HEAD AND NECK TECHNIQUE: Multidetector CT imaging of the head and neck was performed using the standard protocol during bolus administration of intravenous contrast. Multiplanar CT image reconstructions and MIPs were obtained to evaluate the vascular anatomy. Carotid stenosis measurements (when applicable) are obtained utilizing NASCET criteria, using the distal internal carotid diameter as the denominator. CONTRAST:  50 cc Isovue 370 COMPARISON:  03/11/2016 MRI head.  03/11/2016 CT head. FINDINGS: CTA NECK FINDINGS Aortic arch: Severe aortic atherosclerosis with predominantly fibro fatty plaque. Three vessel arch. Moderate 60% stenosis of left subclavian artery origin. Right carotid system: The common carotid artery  short segment of fibro fatty plaque with minimal 30% stenosis. Mixed plaque of the bifurcation with proximal ICA moderate 60% stenosis. Fibro fatty plaque of upper cervical ICA with minimal stenosis. Left carotid system: Upper common carotid artery fibro fatty plaque with minimal stenosis. Dense calcified plaque of carotid bifurcation with severe at least 90% stenosis of proximal internal carotid artery. Downstream to the stenotic segment the internal carotid artery is diminutive in caliber and irregular with additional areas of mild-to-moderate stenosis. Vertebral arteries: Codominant. No evidence of dissection, stenosis (50% or greater) or occlusion. Bilateral V1 mild calcification. Skeleton: Extensive cervical spondylosis with upper cervical facet and multilevel endplate degenerative changes with disc space narrowing and large marginal osteophytes. Uncovertebral and facet hypertrophy results in mild to moderate foraminal narrowing and there is canal stenosis greatest at the C6-7 level where it is at least moderate Other neck: Negative. Upper chest: Negative. Review of the  MIP images confirms the above findings CTA HEAD FINDINGS Anterior circulation: Left M2 superior division occlusion in the anterior sylvian fissure (Series 406 image 28 and 402 image 230). Extensive calcification of bilateral ICA cavernous and paraclinoid segments. No significant stenosis of right ICA. Minimal opacification of left petrous ICA, minimal if at any opacification of left cavernous ICA, and reconstitution of left ICA level of terminus. Large right A1 and anterior communicating artery without appreciable left A1, normal variant. No high-grade stenosis of the proximal M1 branches. There is diminished contrast opacification in the left MCA distribution in comparison with the right. Left MCA is likely perfused primarily by a medium-sized posterior communicating artery. Short segment of moderate stenosis in the mid left posterior  communicating artery. Posterior circulation: No significant stenosis, proximal occlusion, aneurysm, or vascular malformation of the bilateral vertebral arteries, basilar artery, and left posterior cerebral artery. Short segment of severe stenosis of the proximal right P2 segment. Venous sinuses: Limited contrast opacification. Anatomic variants: Large right A1 and anterior communicating artery without appreciable left A1, likely normal variant. Medium left posterior communicating artery. Possible diminutive right posterior communicating artery. Delayed phase: No abnormal intracranial enhancement. Left middle cranial fossa dural calcified mass, likely meningioma. Review of the MIP images confirms the above findings IMPRESSION: 1. Left M2 superior division occlusion in the anterior sylvian fissure. 2. Right carotid bifurcation mixed plaque with moderate 60% proximal ICA stenosis. 3. Left carotid bifurcation calcified plaque with severe at least 90% proximal ICA stenosis. Diminutive caliber of left ICA downstream to stenotic lesion with minimal if any contrast opacification at level of cavernous segment. Reconstitution of left ICA at level of terminus. Left MCA circulation likely largely supplied by left posterior communicating artery. 4. Intracranial atherosclerosis with multiple areas of mild stenosis and short segment of severe stenosis of right proximal P2 segment. 5. Severe atherosclerosis of aortic arch with predominantly fibro fatty plaque. These results will be called to the ordering clinician or representative by the Radiologist Assistant, and communication documented in the PACS or zVision Dashboard. Electronically Signed   By: Mitzi HansenLance  Furusawa-Stratton M.D.   On: 03/12/2016 14:01   Ct Head Wo Contrast  Result Date: 03/11/2016 CLINICAL DATA:  Confusion with slurred speech EXAM: CT HEAD WITHOUT CONTRAST TECHNIQUE: Contiguous axial images were obtained from the base of the skull through the vertex without  intravenous contrast. COMPARISON:  07/25/2009, MRI 03/11/2016 FINDINGS: Brain: No large territorial infarction or intracranial hemorrhage is visualized. Minimal periventricular white matter hypodensities consistent with small vessel disease. Moderate-to-marked global atrophy. Ventricular enlargement felt secondary to atrophy. Subtle low-attenuation within the left insular cortex, best seen on coronal views and felt to correspond to some of the MRI diffusion abnormality. Stable calcified mass in the left middle cranial fossa. Vascular: No hyperdense vessels are visualized. Carotid artery calcifications. Skull: Mastoid air cells are clear.  There is no fracture. Sinuses/Orbits: Mild mucosal thickening within the sinuses. No acute orbital abnormality. Other: None IMPRESSION: 1. No evidence for acute intracranial hemorrhage. Subtle low-density foci within the left temporal lobe and insular cortex, felt to correspond to some of the MRI diffusion abnormality. 2. Moderate to marked atrophy. Electronically Signed   By: Jasmine PangKim  Fujinaga M.D.   On: 03/11/2016 22:13   Ct Angio Neck W Or Wo Contrast  Result Date: 03/12/2016 CLINICAL DATA:  [age] y/o F; slurred speech, right facial droop, and right upper extremity weakness. EXAM: CT ANGIOGRAPHY HEAD AND NECK TECHNIQUE: Multidetector CT imaging of the head and neck was performed using  the standard protocol during bolus administration of intravenous contrast. Multiplanar CT image reconstructions and MIPs were obtained to evaluate the vascular anatomy. Carotid stenosis measurements (when applicable) are obtained utilizing NASCET criteria, using the distal internal carotid diameter as the denominator. CONTRAST:  50 cc Isovue 370 COMPARISON:  03/11/2016 MRI head.  03/11/2016 CT head. FINDINGS: CTA NECK FINDINGS Aortic arch: Severe aortic atherosclerosis with predominantly fibro fatty plaque. Three vessel arch. Moderate 60% stenosis of left subclavian artery origin. Right carotid  system: The common carotid artery short segment of fibro fatty plaque with minimal 30% stenosis. Mixed plaque of the bifurcation with proximal ICA moderate 60% stenosis. Fibro fatty plaque of upper cervical ICA with minimal stenosis. Left carotid system: Upper common carotid artery fibro fatty plaque with minimal stenosis. Dense calcified plaque of carotid bifurcation with severe at least 90% stenosis of proximal internal carotid artery. Downstream to the stenotic segment the internal carotid artery is diminutive in caliber and irregular with additional areas of mild-to-moderate stenosis. Vertebral arteries: Codominant. No evidence of dissection, stenosis (50% or greater) or occlusion. Bilateral V1 mild calcification. Skeleton: Extensive cervical spondylosis with upper cervical facet and multilevel endplate degenerative changes with disc space narrowing and large marginal osteophytes. Uncovertebral and facet hypertrophy results in mild to moderate foraminal narrowing and there is canal stenosis greatest at the C6-7 level where it is at least moderate Other neck: Negative. Upper chest: Negative. Review of the MIP images confirms the above findings CTA HEAD FINDINGS Anterior circulation: Left M2 superior division occlusion in the anterior sylvian fissure (Series 406 image 28 and 402 image 230). Extensive calcification of bilateral ICA cavernous and paraclinoid segments. No significant stenosis of right ICA. Minimal opacification of left petrous ICA, minimal if at any opacification of left cavernous ICA, and reconstitution of left ICA level of terminus. Large right A1 and anterior communicating artery without appreciable left A1, normal variant. No high-grade stenosis of the proximal M1 branches. There is diminished contrast opacification in the left MCA distribution in comparison with the right. Left MCA is likely perfused primarily by a medium-sized posterior communicating artery. Short segment of moderate stenosis  in the mid left posterior communicating artery. Posterior circulation: No significant stenosis, proximal occlusion, aneurysm, or vascular malformation of the bilateral vertebral arteries, basilar artery, and left posterior cerebral artery. Short segment of severe stenosis of the proximal right P2 segment. Venous sinuses: Limited contrast opacification. Anatomic variants: Large right A1 and anterior communicating artery without appreciable left A1, likely normal variant. Medium left posterior communicating artery. Possible diminutive right posterior communicating artery. Delayed phase: No abnormal intracranial enhancement. Left middle cranial fossa dural calcified mass, likely meningioma. Review of the MIP images confirms the above findings IMPRESSION: 1. Left M2 superior division occlusion in the anterior sylvian fissure. 2. Right carotid bifurcation mixed plaque with moderate 60% proximal ICA stenosis. 3. Left carotid bifurcation calcified plaque with severe at least 90% proximal ICA stenosis. Diminutive caliber of left ICA downstream to stenotic lesion with minimal if any contrast opacification at level of cavernous segment. Reconstitution of left ICA at level of terminus. Left MCA circulation likely largely supplied by left posterior communicating artery. 4. Intracranial atherosclerosis with multiple areas of mild stenosis and short segment of severe stenosis of right proximal P2 segment. 5. Severe atherosclerosis of aortic arch with predominantly fibro fatty plaque. These results will be called to the ordering clinician or representative by the Radiologist Assistant, and communication documented in the PACS or zVision Dashboard. Electronically Signed   By:  Mitzi HansenLance  Furusawa-Stratton M.D.   On: 03/12/2016 14:01   Mr Brain Wo Contrast  Result Date: 03/11/2016 CLINICAL DATA:  Initial evaluation for acute right facial droop. EXAM: MRI HEAD WITHOUT CONTRAST TECHNIQUE: Multiplanar, multiecho pulse sequences of the  brain and surrounding structures were obtained without intravenous contrast. COMPARISON:  Prior head CT from earlier the same day. FINDINGS: Brain: Diffuse prominence of the CSF containing spaces is compatible with generalized age-related cerebral atrophy. Patchy T2/FLAIR hyperintensity within the periventricular and deep white matter most compatible chronic microvascular ischemic changes, mild for age. There is patchy restricted diffusion involving the cortical gray matter of the left insular cortex and left frontal operculum, compatible with acute left MCA territory infarct. Minimal patchy cortical infarct more superiorly within the left posterior frontal lobe. No associated hemorrhage or mass effect. Minimal susceptibility artifact within the left sylvian fissure may reflect intraluminal thrombus within distal left MCA vasculature. No other evidence for acute infarct. Gray-white matter differentiation otherwise maintained. No other evidence for acute or chronic intracranial hemorrhage. Densely calcified 2 cm meningioma overlies the left temporal lobe. No associated edema. The no other mass lesion. No midline shift or mass effect. Ventricular prominence related to global parenchymal volume loss without hydrocephalus. No extra-axial fluid collection. Major dural sinuses are patent. Pituitary gland within normal limits. Vascular: Abnormal flow void within the left ICA, which may be related to slow flow and/ or occlusion (series 6, image 8). Major intracranial vascular flow voids otherwise maintained. Skull and upper cervical spine: Craniocervical junction normal. Degenerative spondylolysis noted within the visualized upper cervical spine without significant stenosis. Bone marrow signal intensity normal. No scalp soft tissue abnormality. Sinuses/Orbits: Globes and orbital soft tissues within normal limits. Patient is status post cataract extraction bilaterally. Mild scattered mucosal thickening within the ethmoidal  air cells and maxillary sinuses. No air-fluid level to suggest active sinus infection. Small right mastoid effusion. Inner ear structures grossly normal. IMPRESSION: 1. Small volume acute ischemic left MCA cortical infarct involving the left insula and frontal operculum. No associated hemorrhage or mass effect. 2. Abnormal flow void within the left ICA, suspected to be occluded. This could be further evaluated with dedicated CTA. 3. 2 cm densely calcified meningioma overlying the left temporal lobe without associated edema. 4. Generalized age-related cerebral atrophy with mild chronic small vessel ischemic disease. Electronically Signed   By: Rise MuBenjamin  McClintock M.D.   On: 03/11/2016 22:28   Dg Chest Port 1 View  Result Date: 03/11/2016 CLINICAL DATA:  Slurred speech history of dementia EXAM: PORTABLE CHEST 1 VIEW COMPARISON:  08/19/2009 FINDINGS: Semi-erect portable view chest demonstrates low lung volumes and elevation of the right diaphragm. There is no focal infiltrate or effusion. Cardiomediastinal silhouette stable with borderline enlargement of the heart size. Atherosclerosis. No pneumothorax. IMPRESSION: 1. Borderline to mild cardiomegaly without overt failure 2. Atherosclerosis of the aorta 3. Low lung volumes.  No focal infiltrate Electronically Signed   By: Jasmine PangKim  Fujinaga M.D.   On: 03/11/2016 20:49    Microbiology: Recent Results (from the past 240 hour(s))  Culture, Urine     Status: None (Preliminary result)   Collection Time: 03/11/16 10:37 PM  Result Value Ref Range Status   Specimen Description URINE, RANDOM  Final   Special Requests NONE  Final   Culture CULTURE REINCUBATED FOR BETTER GROWTH  Final   Report Status PENDING  Incomplete     Labs: CBC:  Recent Labs Lab 03/11/16 1919 03/11/16 1938 03/12/16 0825 03/13/16 0455 03/14/16 0350  WBC  13.3*  --  11.6* 10.8* 11.3*  NEUTROABS 10.4*  --   --   --   --   HGB 15.3* 16.3*  16.3* 13.9 13.8 14.4  HCT 44.9 48.0*  48.0*  41.0 41.7 41.6  MCV 90.0  --  90.1 91.4 90.2  PLT 239  --  205 219 234   Basic Metabolic Panel:  Recent Labs Lab 03/11/16 1919 03/11/16 1938 03/11/16 2034 03/12/16 0640 03/12/16 1510 03/14/16 0350  NA 132* 134*  134*  --  137 135 135  K 7.5* 7.5*  7.5* 4.0 4.2 4.3 3.6  CL 102 104  104  --  104 103 100*  CO2 22  --   --  23 22 26   GLUCOSE 98 103*  103*  --  96 126* 94  BUN 26* 46*  46*  --  24* 24* 19  CREATININE 0.96 0.90  0.90  --  0.87 0.98 0.89  CALCIUM 9.2  --   --  9.0 9.0 9.1   Liver Function Tests:  Recent Labs Lab 03/11/16 1919  AST 49*  ALT 17  ALKPHOS 82  BILITOT 1.4*  PROT 6.8  ALBUMIN 3.4*   Time spent: 30 minutes  Signed:  Ader Fritze  [facility]  03/14/2016  , 12:34 PM

## 2016-03-14 NOTE — Progress Notes (Signed)
Report given to Nelwyn Salisburyarolyn Price, [facility] [address] [place].

## 2016-03-14 NOTE — Clinical Social Work Note (Signed)
[facility] and will discharge back to facility today. Nephew Edgardo RoysBill Neese [phone]) contacted and made aware of discharge and ambulance transport. Nurse given phone number to call report.  Genelle BalVanessa Kemesha Mosey, MSW, LCSW Licensed Clinical Social Worker Clinical Social Work Department [facility] [phone]

## 2016-03-14 NOTE — Progress Notes (Signed)
  Echocardiogram 2D Echocardiogram has been performed.  Arvil ChacoFoster, Katleen Carraway 03/14/2016, 3:35 PM

## 2016-03-15 ENCOUNTER — Telehealth: Payer: Self-pay

## 2016-03-15 LAB — URINE CULTURE

## 2016-03-15 NOTE — Telephone Encounter (Signed)
Possible re-admission to facility. [facility] needed if patient was re-admitted to facility upon discharge. [facility].

## 2016-03-18 ENCOUNTER — Non-Acute Institutional Stay (SKILLED_NURSING_FACILITY): Payer: Medicare Other | Admitting: Internal Medicine

## 2016-03-18 ENCOUNTER — Encounter: Payer: Self-pay | Admitting: Internal Medicine

## 2016-03-18 DIAGNOSIS — I639 Cerebral infarction, unspecified: Secondary | ICD-10-CM

## 2016-03-18 DIAGNOSIS — N183 Chronic kidney disease, stage 3 unspecified: Secondary | ICD-10-CM

## 2016-03-18 DIAGNOSIS — E7849 Other hyperlipidemia: Secondary | ICD-10-CM

## 2016-03-18 DIAGNOSIS — I69391 Dysphagia following cerebral infarction: Secondary | ICD-10-CM

## 2016-03-18 DIAGNOSIS — I10 Essential (primary) hypertension: Secondary | ICD-10-CM

## 2016-03-18 DIAGNOSIS — N3000 Acute cystitis without hematuria: Secondary | ICD-10-CM

## 2016-03-18 DIAGNOSIS — I6523 Occlusion and stenosis of bilateral carotid arteries: Secondary | ICD-10-CM | POA: Diagnosis not present

## 2016-03-18 DIAGNOSIS — E039 Hypothyroidism, unspecified: Secondary | ICD-10-CM

## 2016-03-18 DIAGNOSIS — E784 Other hyperlipidemia: Secondary | ICD-10-CM | POA: Diagnosis not present

## 2016-03-18 DIAGNOSIS — F0390 Unspecified dementia without behavioral disturbance: Secondary | ICD-10-CM | POA: Diagnosis not present

## 2016-03-18 NOTE — Progress Notes (Signed)
[facility]  PCP: Oneal GroutPANDEY, Athol Bolds, MD   Code Status: DNR  Goals of care: Advanced Directive information Advanced Directives 01/29/2016  Does patient have an advance directive? Yes  Type of Advance Directive Out of facility DNR (pink MOST or yellow form)  Does patient want to make changes to advanced directive? -  Copy of advanced directive(s) in chart? Yes       Extended Emergency Contact Information Primary Emergency Contact: Neese,Bill Address: [address]          [place], [place] [address] [place] Home Phone: [phone] Mobile Phone: [phone] Relation: Nephew Secondary Emergency Contact: Vanessa BarbaraNeese,Evette  [place] Home Phone: [phone] Mobile Phone: [phone] Relation: Friend   Allergies  Allergen Reactions  . Sulfonamide Derivatives Other (See Comments)    [facility]   Chief Complaint  Patient presents with  . Readmit To SNF    HPI:  Patient is a [age] y.o. female seen today for Long-term care post hospital re-admission from 03/11/2016-03/14/2016 with acute CVA. She was seen by neurology and started on aspirin and Plavix. She was also noted to have significant internal carotid artery stenosis. Vascular surgery was consulted and recommended medical management.She has medical history of dementia, hypertension, chronic kidney disease, hypothyroidism among others. With slurred speech and right-sided weakness. She is seen in her room today.  Review of Systems:  Constitutional: Negative for fever, chills, diaphoresis.  HENT: Negative for headache, congestion, nasal discharge Eyes: Negative for double vision and discharge.  Respiratory: Negative for cough, shortness of breath and wheezing.   Cardiovascular: Negative for chest pain, palpitations, leg swelling.  Gastrointestinal: Negative for heartburn, nausea, vomiting, abdominal pain. Last bowel movement was this morning. Genitourinary: Negative for dysuria.    Musculoskeletal: Negative for fall in the facility.  Skin: Negative for itching, rash.  Neurological: Negative for dizziness.    Past Medical History:  Diagnosis Date  . Chronic kidney disease   . Chronic pain   . COLONIC POLYPS, HX OF 01/01/2008   Qualifier: Diagnosis of  By: Andrey CampanileWilson MD, Raliegh IpLauraLee    . Dementia   . DIVERTICULITIS, HX OF 01/01/2008   Qualifier: Diagnosis of  By: Andrey CampanileWilson MD, Raliegh IpLauraLee    . GASTRIC ULCER 01/01/2008   Qualifier: Diagnosis of  By: Andrey CampanileWilson MD, Raliegh IpLauraLee    . GERD (gastroesophageal reflux disease)   . HCAP (healthcare-associated pneumonia) 05/17/2013  . Hypertension   . Thyroid disease   . Vitamin D deficiency    No past surgical history on file. Social History:   reports that she has never smoked. She does not have any smokeless tobacco history on file. Her alcohol and drug histories are not on file.  No family history on file.  Medications:   Medication List       Accurate as of 03/18/16  1:21 PM. Always use your most recent med list.          Acetaminophen 325 MG Caps Take 650 mg by mouth every 6 (six) hours as needed (pain).   amLODipine 5 MG tablet Commonly known as:  NORVASC Take 0.5 tablets (2.5 mg total) by mouth daily.   aspirin 325 MG tablet Take 1 tablet (325 mg total) by mouth daily.   atorvastatin 20 MG tablet Commonly known as:  LIPITOR Take 1 tablet (20 mg total) by mouth daily at 6 PM.   calcium carbonate 600 MG Tabs tablet Commonly known as:  OS-CAL Take 600 mg by mouth 2 (  two) times daily with a meal.   clopidogrel 75 MG tablet Commonly known as:  PLAVIX Take 1 tablet (75 mg total) by mouth daily.   ketoconazole 2 % shampoo Commonly known as:  NIZORAL Apply 1 application topically 2 (two) times a week. On bath days   levothyroxine 25 MCG tablet Commonly known as:  SYNTHROID, LEVOTHROID Take 25 mcg by mouth daily before breakfast.   OVER THE COUNTER MEDICATION Take 120 mLs by mouth daily. "medpass"-protein  supplement given when taking meds   sennosides-docusate sodium 8.6-50 MG tablet Commonly known as:  SENOKOT-S Take 1 tablet by mouth at bedtime.   zinc oxide 11.3 % Crea cream Commonly known as:  BALMEX Apply 1 application topically. Every shift       Immunizations: Immunization History  Administered Date(s) Administered  . Influenza-Unspecified 02/08/2014, 03/03/2015  . PPD Test 01/17/2014, 01/10/2015  . Pneumococcal-Unspecified 11/08/2009     Physical Exam: Vitals:   03/18/16 1317  BP: 140/75  Pulse: 60  Resp: 17  Temp: 97.6 F (36.4 C)  TempSrc: Oral  SpO2: 97%  Weight: 121 lb (54.9 kg)  Height: 5' (1.524 m)   Body mass index is 23.63 kg/m.  General- elderly female, well built, in no acute distress Head- normocephalic, atraumatic Throat- moist mucus membrane Eyes- PERRLA, EOMI, no pallor, no icterus, no discharge, normal conjunctiva, normal sclera Neck- no cervical lymphadenopathy Cardiovascular- normal s1,s2, no murmur Respiratory- bilateral clear to auscultation Abdomen- bowel sounds present, soft, non tender Musculoskeletal- able to move all 4 extremities, generalized weakness Neurological- alert and oriented to self and place only Skin- warm and dry, easy bruising Psychiatry- normal mood and affect    Labs reviewed: Basic Metabolic Panel:  Recent Labs  16/10/96 0640 03/12/16 1510 03/14/16 0350  NA 137 135 135  K 4.2 4.3 3.6  CL 104 103 100*  CO2 23 22 26   GLUCOSE 96 126* 94  BUN 24* 24* 19  CREATININE 0.87 0.98 0.89  CALCIUM 9.0 9.0 9.1   Liver Function Tests:  Recent Labs  05/29/15 06/23/15 03/11/16 1919  AST 20 18 49*  ALT 9 7 17   ALKPHOS 62 67 82  BILITOT  --   --  1.4*  PROT  --   --  6.8  ALBUMIN  --   --  3.4*   No results for input(s): LIPASE, AMYLASE in the last 8760 hours. No results for input(s): AMMONIA in the last 8760 hours. CBC:  Recent Labs  03/11/16 1919  03/12/16 0825 03/13/16 0455 03/14/16 0350  WBC  13.3*  --  11.6* 10.8* 11.3*  NEUTROABS 10.4*  --   --   --   --   HGB 15.3*  < > 13.9 13.8 14.4  HCT 44.9  < > 41.0 41.7 41.6  MCV 90.0  --  90.1 91.4 90.2  PLT 239  --  205 219 234  < > = values in this interval not displayed.  Radiological Exams: Ct Angio Head W Or Wo Contrast  Result Date: 03/12/2016 CLINICAL DATA:  82 y/o F; slurred speech, right facial droop, and right upper extremity weakness. EXAM: CT ANGIOGRAPHY HEAD AND NECK TECHNIQUE: Multidetector CT imaging of the head and neck was performed using the standard protocol during bolus administration of intravenous contrast. Multiplanar CT image reconstructions and MIPs were obtained to evaluate the vascular anatomy. Carotid stenosis measurements (when applicable) are obtained utilizing NASCET criteria, using the distal internal carotid diameter as the denominator. CONTRAST:  50 cc Isovue  370 COMPARISON:  03/11/2016 MRI head.  03/11/2016 CT head. FINDINGS: CTA NECK FINDINGS Aortic arch: Severe aortic atherosclerosis with predominantly fibro fatty plaque. Three vessel arch. Moderate 60% stenosis of left subclavian artery origin. Right carotid system: The common carotid artery short segment of fibro fatty plaque with minimal 30% stenosis. Mixed plaque of the bifurcation with proximal ICA moderate 60% stenosis. Fibro fatty plaque of upper cervical ICA with minimal stenosis. Left carotid system: Upper common carotid artery fibro fatty plaque with minimal stenosis. Dense calcified plaque of carotid bifurcation with severe at least 90% stenosis of proximal internal carotid artery. Downstream to the stenotic segment the internal carotid artery is diminutive in caliber and irregular with additional areas of mild-to-moderate stenosis. Vertebral arteries: Codominant. No evidence of dissection, stenosis (50% or greater) or occlusion. Bilateral V1 mild calcification. Skeleton: Extensive cervical spondylosis with upper cervical facet and multilevel endplate  degenerative changes with disc space narrowing and large marginal osteophytes. Uncovertebral and facet hypertrophy results in mild to moderate foraminal narrowing and there is canal stenosis greatest at the C6-7 level where it is at least moderate Other neck: Negative. Upper chest: Negative. Review of the MIP images confirms the above findings CTA HEAD FINDINGS Anterior circulation: Left M2 superior division occlusion in the anterior sylvian fissure (Series 406 image 28 and 402 image 230). Extensive calcification of bilateral ICA cavernous and paraclinoid segments. No significant stenosis of right ICA. Minimal opacification of left petrous ICA, minimal if at any opacification of left cavernous ICA, and reconstitution of left ICA level of terminus. Large right A1 and anterior communicating artery without appreciable left A1, normal variant. No high-grade stenosis of the proximal M1 branches. There is diminished contrast opacification in the left MCA distribution in comparison with the right. Left MCA is likely perfused primarily by a medium-sized posterior communicating artery. Short segment of moderate stenosis in the mid left posterior communicating artery. Posterior circulation: No significant stenosis, proximal occlusion, aneurysm, or vascular malformation of the bilateral vertebral arteries, basilar artery, and left posterior cerebral artery. Short segment of severe stenosis of the proximal right P2 segment. Venous sinuses: Limited contrast opacification. Anatomic variants: Large right A1 and anterior communicating artery without appreciable left A1, likely normal variant. Medium left posterior communicating artery. Possible diminutive right posterior communicating artery. Delayed phase: No abnormal intracranial enhancement. Left middle cranial fossa dural calcified mass, likely meningioma. Review of the MIP images confirms the above findings IMPRESSION: 1. Left M2 superior division occlusion in the anterior  sylvian fissure. 2. Right carotid bifurcation mixed plaque with moderate 60% proximal ICA stenosis. 3. Left carotid bifurcation calcified plaque with severe at least 90% proximal ICA stenosis. Diminutive caliber of left ICA downstream to stenotic lesion with minimal if any contrast opacification at level of cavernous segment. Reconstitution of left ICA at level of terminus. Left MCA circulation likely largely supplied by left posterior communicating artery. 4. Intracranial atherosclerosis with multiple areas of mild stenosis and short segment of severe stenosis of right proximal P2 segment. 5. Severe atherosclerosis of aortic arch with predominantly fibro fatty plaque. These results will be called to the ordering clinician or representative by the Radiologist Assistant, and communication documented in the PACS or zVision Dashboard. Electronically Signed   By: Mitzi Hansen M.D.   On: 03/12/2016 14:01   Ct Head Wo Contrast  Result Date: 03/11/2016 CLINICAL DATA:  Confusion with slurred speech EXAM: CT HEAD WITHOUT CONTRAST TECHNIQUE: Contiguous axial images were obtained from the base of the skull through the vertex  without intravenous contrast. COMPARISON:  07/25/2009, MRI 03/11/2016 FINDINGS: Brain: No large territorial infarction or intracranial hemorrhage is visualized. Minimal periventricular white matter hypodensities consistent with small vessel disease. Moderate-to-marked global atrophy. Ventricular enlargement felt secondary to atrophy. Subtle low-attenuation within the left insular cortex, best seen on coronal views and felt to correspond to some of the MRI diffusion abnormality. Stable calcified mass in the left middle cranial fossa. Vascular: No hyperdense vessels are visualized. Carotid artery calcifications. Skull: Mastoid air cells are clear.  There is no fracture. Sinuses/Orbits: Mild mucosal thickening within the sinuses. No acute orbital abnormality. Other: None IMPRESSION: 1. No  evidence for acute intracranial hemorrhage. Subtle low-density foci within the left temporal lobe and insular cortex, felt to correspond to some of the MRI diffusion abnormality. 2. Moderate to marked atrophy. Electronically Signed   By: Jasmine Pang M.D.   On: 03/11/2016 22:13   Ct Angio Neck W Or Wo Contrast  Result Date: 03/12/2016 CLINICAL DATA:  82 y/o F; slurred speech, right facial droop, and right upper extremity weakness. EXAM: CT ANGIOGRAPHY HEAD AND NECK TECHNIQUE: Multidetector CT imaging of the head and neck was performed using the standard protocol during bolus administration of intravenous contrast. Multiplanar CT image reconstructions and MIPs were obtained to evaluate the vascular anatomy. Carotid stenosis measurements (when applicable) are obtained utilizing NASCET criteria, using the distal internal carotid diameter as the denominator. CONTRAST:  50 cc Isovue 370 COMPARISON:  03/11/2016 MRI head.  03/11/2016 CT head. FINDINGS: CTA NECK FINDINGS Aortic arch: Severe aortic atherosclerosis with predominantly fibro fatty plaque. Three vessel arch. Moderate 60% stenosis of left subclavian artery origin. Right carotid system: The common carotid artery short segment of fibro fatty plaque with minimal 30% stenosis. Mixed plaque of the bifurcation with proximal ICA moderate 60% stenosis. Fibro fatty plaque of upper cervical ICA with minimal stenosis. Left carotid system: Upper common carotid artery fibro fatty plaque with minimal stenosis. Dense calcified plaque of carotid bifurcation with severe at least 90% stenosis of proximal internal carotid artery. Downstream to the stenotic segment the internal carotid artery is diminutive in caliber and irregular with additional areas of mild-to-moderate stenosis. Vertebral arteries: Codominant. No evidence of dissection, stenosis (50% or greater) or occlusion. Bilateral V1 mild calcification. Skeleton: Extensive cervical spondylosis with upper cervical facet  and multilevel endplate degenerative changes with disc space narrowing and large marginal osteophytes. Uncovertebral and facet hypertrophy results in mild to moderate foraminal narrowing and there is canal stenosis greatest at the C6-7 level where it is at least moderate Other neck: Negative. Upper chest: Negative. Review of the MIP images confirms the above findings CTA HEAD FINDINGS Anterior circulation: Left M2 superior division occlusion in the anterior sylvian fissure (Series 406 image 28 and 402 image 230). Extensive calcification of bilateral ICA cavernous and paraclinoid segments. No significant stenosis of right ICA. Minimal opacification of left petrous ICA, minimal if at any opacification of left cavernous ICA, and reconstitution of left ICA level of terminus. Large right A1 and anterior communicating artery without appreciable left A1, normal variant. No high-grade stenosis of the proximal M1 branches. There is diminished contrast opacification in the left MCA distribution in comparison with the right. Left MCA is likely perfused primarily by a medium-sized posterior communicating artery. Short segment of moderate stenosis in the mid left posterior communicating artery. Posterior circulation: No significant stenosis, proximal occlusion, aneurysm, or vascular malformation of the bilateral vertebral arteries, basilar artery, and left posterior cerebral artery. Short segment of severe stenosis of  the proximal right P2 segment. Venous sinuses: Limited contrast opacification. Anatomic variants: Large right A1 and anterior communicating artery without appreciable left A1, likely normal variant. Medium left posterior communicating artery. Possible diminutive right posterior communicating artery. Delayed phase: No abnormal intracranial enhancement. Left middle cranial fossa dural calcified mass, likely meningioma. Review of the MIP images confirms the above findings IMPRESSION: 1. Left M2 superior division  occlusion in the anterior sylvian fissure. 2. Right carotid bifurcation mixed plaque with moderate 60% proximal ICA stenosis. 3. Left carotid bifurcation calcified plaque with severe at least 90% proximal ICA stenosis. Diminutive caliber of left ICA downstream to stenotic lesion with minimal if any contrast opacification at level of cavernous segment. Reconstitution of left ICA at level of terminus. Left MCA circulation likely largely supplied by left posterior communicating artery. 4. Intracranial atherosclerosis with multiple areas of mild stenosis and short segment of severe stenosis of right proximal P2 segment. 5. Severe atherosclerosis of aortic arch with predominantly fibro fatty plaque. These results will be called to the ordering clinician or representative by the Radiologist Assistant, and communication documented in the PACS or zVision Dashboard. Electronically Signed   By: Mitzi HansenLance  Furusawa-Stratton M.D.   On: 03/12/2016 14:01   Mr Brain Wo Contrast  Result Date: 03/11/2016 CLINICAL DATA:  Initial evaluation for acute right facial droop. EXAM: MRI HEAD WITHOUT CONTRAST TECHNIQUE: Multiplanar, multiecho pulse sequences of the brain and surrounding structures were obtained without intravenous contrast. COMPARISON:  Prior head CT from earlier the same day. FINDINGS: Brain: Diffuse prominence of the CSF containing spaces is compatible with generalized age-related cerebral atrophy. Patchy T2/FLAIR hyperintensity within the periventricular and deep white matter most compatible chronic microvascular ischemic changes, mild for age. There is patchy restricted diffusion involving the cortical gray matter of the left insular cortex and left frontal operculum, compatible with acute left MCA territory infarct. Minimal patchy cortical infarct more superiorly within the left posterior frontal lobe. No associated hemorrhage or mass effect. Minimal susceptibility artifact within the left sylvian fissure may reflect  intraluminal thrombus within distal left MCA vasculature. No other evidence for acute infarct. Gray-white matter differentiation otherwise maintained. No other evidence for acute or chronic intracranial hemorrhage. Densely calcified 2 cm meningioma overlies the left temporal lobe. No associated edema. The no other mass lesion. No midline shift or mass effect. Ventricular prominence related to global parenchymal volume loss without hydrocephalus. No extra-axial fluid collection. Major dural sinuses are patent. Pituitary gland within normal limits. Vascular: Abnormal flow void within the left ICA, which may be related to slow flow and/ or occlusion (series 6, image 8). Major intracranial vascular flow voids otherwise maintained. Skull and upper cervical spine: Craniocervical junction normal. Degenerative spondylolysis noted within the visualized upper cervical spine without significant stenosis. Bone marrow signal intensity normal. No scalp soft tissue abnormality. Sinuses/Orbits: Globes and orbital soft tissues within normal limits. Patient is status post cataract extraction bilaterally. Mild scattered mucosal thickening within the ethmoidal air cells and maxillary sinuses. No air-fluid level to suggest active sinus infection. Small right mastoid effusion. Inner ear structures grossly normal. IMPRESSION: 1. Small volume acute ischemic left MCA cortical infarct involving the left insula and frontal operculum. No associated hemorrhage or mass effect. 2. Abnormal flow void within the left ICA, suspected to be occluded. This could be further evaluated with dedicated CTA. 3. 2 cm densely calcified meningioma overlying the left temporal lobe without associated edema. 4. Generalized age-related cerebral atrophy with mild chronic small vessel ischemic disease. Electronically Signed  By: Rise Mu M.D.   On: 03/11/2016 22:28   Dg Chest Port 1 View  Result Date: 03/11/2016 CLINICAL DATA:  Slurred speech history  of dementia EXAM: PORTABLE CHEST 1 VIEW COMPARISON:  08/19/2009 FINDINGS: Semi-erect portable view chest demonstrates low lung volumes and elevation of the right diaphragm. There is no focal infiltrate or effusion. Cardiomediastinal silhouette stable with borderline enlargement of the heart size. Atherosclerosis. No pneumothorax. IMPRESSION: 1. Borderline to mild cardiomegaly without overt failure 2. Atherosclerosis of the aorta 3. Low lung volumes.  No focal infiltrate Electronically Signed   By: Jasmine Pang M.D.   On: 03/11/2016 20:49    Assessment/Plan  Acute CVA MRI brain showing small left middle cerebral artery cortical infarct. CTA head and neck showing significant left ICA 90% stenosis in right ICA 60% stenosis. She is now on aspirin 325 mg daily and Plavix 75 mg daily for 3 months and then Plavix alone. Continue Lipitor 20 mg daily. Will need follow-up with neurology. Patient to work with physical therapy and occupational therapy.  Dysphagia From CVA. Continue dysphagia 2 diet with pured feed and thin liquid. Aspiration precautions to be taken patient to be followed by speech therapist.  Urinary tract infection She has completed her course of Keflex on 12th of November 2017. Maintain hydration.  Bilateral internal carotid artery stenosis Reviewed CTA head and neck. Patient was seen by vascular surgery team in the hospital. No workup and invasive treatment for now. Medical management to be continued.  Hypertension Monitor blood pressure reading. Continue amlodipine 2.5 mg daily.  Hyperlipidemia LDL 107. Continue Lipitor  Chronic kidney disease stage III Avoid nephrotoxins. Monitor BMP periodically.  Hypothyroidism Continue Synthroid  Dementia without behavioral disturbance Provide supportive care. Fall precautions to be taken. Aspiration precautions to be taken. Pressure ulcer prophylaxis.    Goals of care: Long-term care   Labs/tests ordered: CBC, BMP  Family/ staff  Communication: reviewed care plan with patient and nursing supervisor    Oneal Grout, MD Internal Medicine [facility] [address] [place], [place] [address] Cell Phone (Monday-Friday 8 am - 5 pm): [phone] On Call: [phone] and follow prompts after 5 pm and on weekends Office Phone: [phone] Office Fax: [phone]

## 2016-03-20 LAB — BASIC METABOLIC PANEL
BUN: 23 mg/dL — AB (ref 4–21)
CREATININE: 0.9 mg/dL (ref 0.5–1.1)
Glucose: 90 mg/dL
POTASSIUM: 4.7 mmol/L (ref 3.4–5.3)
Sodium: 138 mmol/L (ref 137–147)

## 2016-03-20 LAB — CBC AND DIFFERENTIAL
HCT: 43 % (ref 36–46)
Hemoglobin: 14.1 g/dL (ref 12.0–16.0)
Platelets: 204 10*3/uL (ref 150–399)
WBC: 9.9 10*3/mL

## 2016-04-02 ENCOUNTER — Encounter: Payer: Self-pay | Admitting: Family

## 2016-04-02 ENCOUNTER — Non-Acute Institutional Stay (SKILLED_NURSING_FACILITY): Payer: Medicare Other | Admitting: Family

## 2016-04-02 DIAGNOSIS — M810 Age-related osteoporosis without current pathological fracture: Secondary | ICD-10-CM

## 2016-04-02 DIAGNOSIS — K5901 Slow transit constipation: Secondary | ICD-10-CM

## 2016-04-02 DIAGNOSIS — M150 Primary generalized (osteo)arthritis: Secondary | ICD-10-CM | POA: Diagnosis not present

## 2016-04-02 DIAGNOSIS — M159 Polyosteoarthritis, unspecified: Secondary | ICD-10-CM

## 2016-04-02 DIAGNOSIS — E039 Hypothyroidism, unspecified: Secondary | ICD-10-CM

## 2016-04-02 DIAGNOSIS — I10 Essential (primary) hypertension: Secondary | ICD-10-CM | POA: Diagnosis not present

## 2016-04-02 NOTE — Progress Notes (Signed)
Location:  [facility] Room Number: 901-A Place of Service:  SNF (31) Provider:Arieon Corcoran FNP-C   Oneal GroutPANDEY, MAHIMA, MD  Patient Care Team: Oneal GroutMahima Pandey, MD as PCP - General (Internal Medicine) Sharee Holstereborah S Green, NP as Nurse Practitioner (Nurse Practitioner)  Extended Emergency Contact Information Primary Emergency Contact: Neese,Bill Address: [address]          [place], [place] [address] [place] Home Phone: [phone] Mobile Phone: [phone] Relation: Nephew Secondary Emergency Contact: Vanessa BarbaraNeese,Evette  [place] Home Phone: [phone] Mobile Phone: [phone] Relation: Friend  Code Status: DNR  Goals of care: Advanced Directive information Advanced Directives 01/29/2016  Does Patient Have a Medical Advance Directive? Yes  Type of Advance Directive Out of facility DNR (pink MOST or yellow form)  Does patient want to make changes to medical advance directive? -  Copy of Healthcare Power of Attorney in Chart? Yes     Chief Complaint  Patient presents with  . Medical Management of Chronic Issues    Routine Visit    HPI:  Pt is a [age] y.o. female [facility] for medical management of chronic diseases.She has a medical history of HTN, CAD, Stroke, COPD, Dementia among other conditions. She is seen in her room today. She denies any acute issues this visit. She has had no other slurred speech since recent hospital admission 03/11/2016. Facility staff reports no new concerns. She continues to work with speech therapy.      Past Medical History:  Diagnosis Date  . Chronic kidney disease   . Chronic pain   . COLONIC POLYPS, HX OF 01/01/2008   Qualifier: Diagnosis of  By: Andrey CampanileWilson MD, Raliegh IpLauraLee    . Dementia   . DIVERTICULITIS, HX OF 01/01/2008   Qualifier: Diagnosis of  By: Andrey CampanileWilson MD, Raliegh IpLauraLee    . GASTRIC ULCER 01/01/2008   Qualifier: Diagnosis of  By: Andrey CampanileWilson MD, Raliegh IpLauraLee    .  GERD (gastroesophageal reflux disease)   . HCAP (healthcare-associated pneumonia) 05/17/2013  . Hypertension   . Thyroid disease   . Vitamin D deficiency    No past surgical history on file.  Allergies  Allergen Reactions  . Sulfonamide Derivatives Other (See Comments)    On MAR      Medication List       Accurate as of 04/02/16 10:18 AM. Always use your most recent med list.          Acetaminophen 325 MG Caps Take 650 mg by mouth every 6 (six) hours as needed (pain).   amLODipine 5 MG tablet Commonly known as:  NORVASC Take 0.5 tablets (2.5 mg total) by mouth daily.   aspirin 325 MG tablet Take 1 tablet (325 mg total) by mouth daily.   atorvastatin 20 MG tablet Commonly known as:  LIPITOR Take 1 tablet (20 mg total) by mouth daily at 6 PM.   calcium carbonate 600 MG Tabs tablet Commonly known as:  OS-CAL Take 600 mg by mouth 2 (two) times daily with a meal.   clopidogrel 75 MG tablet Commonly known as:  PLAVIX Take 1 tablet (75 mg total) by mouth daily.   ketoconazole 2 % shampoo Commonly known as:  NIZORAL Apply 1 application topically 2 (two) times a week. On bath days   levothyroxine 25 MCG tablet Commonly known as:  SYNTHROID, LEVOTHROID Take 25 mcg by mouth daily before breakfast.   OVER THE COUNTER MEDICATION Take 120  mLs by mouth 2 (two) times daily. "medpass"-protein supplement given when taking meds   sennosides-docusate sodium 8.6-50 MG tablet Commonly known as:  SENOKOT-S Take 1 tablet by mouth 2 (two) times daily.   sennosides-docusate sodium 8.6-50 MG tablet Commonly known as:  SENOKOT-S Take 1 tablet by mouth at bedtime.   zinc oxide 11.3 % Crea cream Commonly known as:  BALMEX Apply 1 application topically. Every shift       Review of Systems  Unable to perform ROS: Dementia    Immunization History  Administered Date(s) Administered  . Influenza-Unspecified 02/08/2014, 03/03/2015, 02/08/2016  . PPD Test 01/17/2014,  01/10/2015  . Pneumococcal-Unspecified 11/08/2009   Pertinent  Health Maintenance Due  Topic Date Due  . INFLUENZA VACCINE  12/05/2015  . PNA vac Low Risk Adult (2 of 2 - PCV13) 07/01/2016 (Originally 11/09/2010)  . DEXA SCAN  Completed   No flowsheet data found. Functional Status Survey:    Vitals:   04/02/16 1001  BP: (!) 167/86  Pulse: 65  Resp: 18  Temp: 97.9 F (36.6 C)  TempSrc: Oral  SpO2: 95%  Weight: 121 lb 8 oz (55.1 kg)  Height: 5' (1.524 m)   Body mass index is 23.73 kg/m. Physical Exam  Constitutional:  confused at baseline in no acute distress.  HENT:  Head: Normocephalic.  Mouth/Throat: Oropharynx is clear and moist.  Eyes: Conjunctivae and EOM are normal. Pupils are equal, round, and reactive to light. Right eye exhibits no discharge. Left eye exhibits no discharge. No scleral icterus.  Neck: Normal range of motion. No JVD present. No thyromegaly present.  Cardiovascular: Normal rate, regular rhythm, normal heart sounds and intact distal pulses.  Exam reveals no gallop and no friction rub.   No murmur heard. Pulmonary/Chest: Effort normal and breath sounds normal. No respiratory distress. She has no wheezes. She has no rales.  Abdominal: Soft. Bowel sounds are normal. She exhibits no distension. There is no tenderness. There is no rebound and no guarding.  Genitourinary:  Genitourinary Comments: Incontinent for B/B   Musculoskeletal: Normal range of motion. She exhibits no edema or tenderness.  Bed bound   Lymphadenopathy:    She has no cervical adenopathy.  Neurological: She is alert.   confused at baseline though more alert and answering questions appropriately at times this visit.   Skin: Skin is warm and dry. No rash noted. No erythema. No pallor.  Skin intact  Psychiatric: She has a normal mood and affect.    Labs reviewed:  Recent Labs  03/12/16 0640 03/12/16 1510 03/14/16 0350  NA 137 135 135  K 4.2 4.3 3.6  CL 104 103 100*  CO2 23 22  26   GLUCOSE 96 126* 94  BUN 24* 24* 19  CREATININE 0.87 0.98 0.89  CALCIUM 9.0 9.0 9.1    Recent Labs  05/29/15 06/23/15 03/11/16 1919  AST 20 18 49*  ALT 9 7 17   ALKPHOS 62 67 82  BILITOT  --   --  1.4*  PROT  --   --  6.8  ALBUMIN  --   --  3.4*    Recent Labs  03/11/16 1919  03/12/16 0825 03/13/16 0455 03/14/16 0350  WBC 13.3*  --  11.6* 10.8* 11.3*  NEUTROABS 10.4*  --   --   --   --   HGB 15.3*  < > 13.9 13.8 14.4  HCT 44.9  < > 41.0 41.7 41.6  MCV 90.0  --  90.1 91.4 90.2  PLT 239  --  205 219 234  < > = values in this interval not displayed. Lab Results  Component Value Date   TSH 4.247 03/12/2016   Lab Results  Component Value Date   HGBA1C 5.3 03/13/2016   Lab Results  Component Value Date   CHOL 158 03/12/2016   HDL 38 (L) 03/12/2016   LDLCALC 107 (H) 03/12/2016   TRIG 66 03/12/2016   CHOLHDL 4.2 03/12/2016   Assessment/Plan HTN B/P stable. Continue on Amlodipine. Monitor BMP   Hypothyroidism  Continue Levothyroxine 25 mcg Tablet. Monitor TSH level.   OA Tylenol PRN. Continue restorative therapy.    Osteoporosis  Non-ambulatory. Continue on Calcium and vit D supplements.  Constipation Continue current stool softeners.   Family/ staff Communication: Reviewed plan of care with patient and facility Nurse supervisor.   Labs/tests ordered:  None

## 2016-04-15 ENCOUNTER — Non-Acute Institutional Stay (SKILLED_NURSING_FACILITY): Payer: Medicare Other | Admitting: Family

## 2016-04-15 DIAGNOSIS — R001 Bradycardia, unspecified: Secondary | ICD-10-CM

## 2016-04-15 NOTE — Progress Notes (Signed)
Location:  [facility] Room Number: 901 A  Place of Service:  SNF (31) Provider: Zadkiel Dragan FNP-C   Oneal GroutPANDEY, MAHIMA, MD  Patient Care Team: Oneal GroutMahima Pandey, MD as PCP - General (Internal Medicine) Sharee Holstereborah S Green, NP as Nurse Practitioner (Nurse Practitioner)  Extended Emergency Contact Information Primary Emergency Contact: Neese,Bill Address: [address]          [place], [place] [address] [place] Home Phone: [phone] Mobile Phone: [phone] Relation: Nephew Secondary Emergency Contact: Vanessa BarbaraNeese,Evette  [place] Home Phone: [phone] Mobile Phone: [phone] Relation: Friend  Code Status: DNR  Goals of care: Advanced Directive information Advanced Directives 01/29/2016  Does Patient Have a Medical Advance Directive? Yes  Type of Advance Directive Out of facility DNR (pink MOST or yellow form)  Does patient want to make changes to medical advance directive? -  Copy of Healthcare Power of Attorney in Chart? Yes     Chief Complaint  Patient presents with  . Acute Visit    HPI:  Pt is a [age] y.o. female [facility] for an acute visit for evaluation of low heart rate. She has a medical history of HTN, Stroke, COPD, Hypothyroidism, OA, Depression, GERD among other conditions. She is seen in her room today watching TV. She denies any acute issues this visit. She is pleasantly confused at baseline. Facility Nurse reports patient's Heart rate ranged from 50-53 during the shift. No complications noted. Patient's facility HR Log reviewed ranges in the 50's-60's.   Past Medical History:  Diagnosis Date  . Chronic kidney disease   . Chronic pain   . COLONIC POLYPS, HX OF 01/01/2008   Qualifier: Diagnosis of  By: Andrey CampanileWilson MD, Raliegh IpLauraLee    . Dementia   . DIVERTICULITIS, HX OF 01/01/2008   Qualifier: Diagnosis of  By: Andrey CampanileWilson MD, Raliegh IpLauraLee    . GASTRIC ULCER 01/01/2008   Qualifier:  Diagnosis of  By: Andrey CampanileWilson MD, Raliegh IpLauraLee    . GERD (gastroesophageal reflux disease)   . HCAP (healthcare-associated pneumonia) 05/17/2013  . Hypertension   . Thyroid disease   . Vitamin D deficiency    No past surgical history on file.  Allergies  Allergen Reactions  . Sulfonamide Derivatives Other (See Comments)    On MAR      Medication List       Accurate as of 04/15/16  6:50 PM. Always use your most recent med list.          Acetaminophen 325 MG Caps Take 650 mg by mouth every 6 (six) hours as needed (pain).   amLODipine 5 MG tablet Commonly known as:  NORVASC Take 0.5 tablets (2.5 mg total) by mouth daily.   aspirin 325 MG tablet Take 1 tablet (325 mg total) by mouth daily.   atorvastatin 20 MG tablet Commonly known as:  LIPITOR Take 1 tablet (20 mg total) by mouth daily at 6 PM.   calcium carbonate 600 MG Tabs tablet Commonly known as:  OS-CAL Take 600 mg by mouth 2 (two) times daily with a meal.   clopidogrel 75 MG tablet Commonly known as:  PLAVIX Take 1 tablet (75 mg total) by mouth daily.   ketoconazole 2 % shampoo Commonly known as:  NIZORAL Apply 1 application topically 2 (two) times a week. On bath days   levothyroxine 25 MCG tablet Commonly known as:  SYNTHROID, LEVOTHROID Take 25 mcg by mouth daily before breakfast.  OVER THE COUNTER MEDICATION Take 120 mLs by mouth 2 (two) times daily. "medpass"-protein supplement given when taking meds   sennosides-docusate sodium 8.6-50 MG tablet Commonly known as:  SENOKOT-S Take 1 tablet by mouth 2 (two) times daily.   zinc oxide 11.3 % Crea cream Commonly known as:  BALMEX Apply 1 application topically. Every shift       Review of Systems  Unable to perform ROS: Dementia    Immunization History  Administered Date(s) Administered  . Influenza-Unspecified 02/08/2014, 03/03/2015, 02/08/2016  . PPD Test 01/17/2014, 01/10/2015  . Pneumococcal-Unspecified 11/08/2009   Pertinent  Health  Maintenance Due  Topic Date Due  . PNA vac Low Risk Adult (2 of 2 - PCV13) 07/01/2016 (Originally 11/09/2010)  . INFLUENZA VACCINE  Completed  . DEXA SCAN  Completed      Vitals:   04/15/16 1130  BP: (!) 147/70  Pulse: (!) 53  Resp: 18  Temp: 97.8 F (36.6 C)  SpO2: 99%  Weight: 120 lb 3.2 oz (54.5 kg)  Height: 5' (1.524 m)   Body mass index is 23.47 kg/m. Physical Exam  Constitutional: No distress.  Pleasantly confused at baseline.   HENT:  Head: Normocephalic.  Mouth/Throat: Oropharynx is clear and moist.  Eyes: Conjunctivae and EOM are normal. Pupils are equal, round, and reactive to light. Right eye exhibits no discharge. Left eye exhibits no discharge. No scleral icterus.  Neck: Normal range of motion. No JVD present. No thyromegaly present.  Cardiovascular: Normal rate, regular rhythm, normal heart sounds and intact distal pulses.  Exam reveals no gallop and no friction rub.   No murmur heard. Pulmonary/Chest: Effort normal and breath sounds normal. No respiratory distress. She has no wheezes. She has no rales.  Abdominal: Soft. Bowel sounds are normal. She exhibits no distension. There is no tenderness. There is no rebound and no guarding.  Genitourinary:  Genitourinary Comments: Incontinent   Musculoskeletal: Normal range of motion. She exhibits no edema, tenderness or deformity.  Bed bound   Lymphadenopathy:    She has no cervical adenopathy.  Neurological: She is alert.   Pleasantly confused at baseline  Skin: Skin is warm and dry. No rash noted. No erythema. No pallor.  No skin breakdown.   Psychiatric: She has a normal mood and affect.    Labs reviewed:  Recent Labs  03/12/16 0640 03/12/16 1510 03/14/16 0350  NA 137 135 135  K 4.2 4.3 3.6  CL 104 103 100*  CO2 23 22 26   GLUCOSE 96 126* 94  BUN 24* 24* 19  CREATININE 0.87 0.98 0.89  CALCIUM 9.0 9.0 9.1    Recent Labs  05/29/15 06/23/15 03/11/16 1919  AST 20 18 49*  ALT 9 7 17   ALKPHOS 62 67  82  BILITOT  --   --  1.4*  PROT  --   --  6.8  ALBUMIN  --   --  3.4*    Recent Labs  03/11/16 1919  03/12/16 0825 03/13/16 0455 03/14/16 0350  WBC 13.3*  --  11.6* 10.8* 11.3*  NEUTROABS 10.4*  --   --   --   --   HGB 15.3*  < > 13.9 13.8 14.4  HCT 44.9  < > 41.0 41.7 41.6  MCV 90.0  --  90.1 91.4 90.2  PLT 239  --  205 219 234  < > = values in this interval not displayed. Lab Results  Component Value Date   TSH 4.247 03/12/2016   Lab  Results  Component Value Date   HGBA1C 5.3 03/13/2016   Lab Results  Component Value Date   CHOL 158 03/12/2016   HDL 38 (L) 03/12/2016   LDLCALC 107 (H) 03/12/2016   TRIG 66 03/12/2016   CHOLHDL 4.2 03/12/2016    Assessment/Plan Bradycardia Nurse reports HR in the 50-53 b/min. Facility log reviewed HR chronically in the 50's-60's for patient. Recent ECG showed SR with right BBB (03/12/2016) chronic for patient. Asymptomatic.Continue to monitor.     Family/ staff Communication: Reviewed plan of care with facility Nurse supervisor.   Labs/tests ordered: None

## 2016-04-17 ENCOUNTER — Ambulatory Visit: Payer: Medicare Other | Admitting: Nurse Practitioner

## 2016-05-08 ENCOUNTER — Encounter: Payer: Self-pay | Admitting: Nurse Practitioner

## 2016-05-08 ENCOUNTER — Ambulatory Visit (INDEPENDENT_AMBULATORY_CARE_PROVIDER_SITE_OTHER): Payer: Medicare Other | Admitting: Nurse Practitioner

## 2016-05-08 ENCOUNTER — Ambulatory Visit: Payer: Medicare Other | Admitting: Nurse Practitioner

## 2016-05-08 VITALS — BP 159/73 | HR 65 | Ht 60.0 in

## 2016-05-08 DIAGNOSIS — E785 Hyperlipidemia, unspecified: Secondary | ICD-10-CM

## 2016-05-08 DIAGNOSIS — F0390 Unspecified dementia without behavioral disturbance: Secondary | ICD-10-CM

## 2016-05-08 DIAGNOSIS — I10 Essential (primary) hypertension: Secondary | ICD-10-CM

## 2016-05-08 DIAGNOSIS — I6523 Occlusion and stenosis of bilateral carotid arteries: Secondary | ICD-10-CM | POA: Diagnosis not present

## 2016-05-08 DIAGNOSIS — I639 Cerebral infarction, unspecified: Secondary | ICD-10-CM | POA: Diagnosis not present

## 2016-05-08 NOTE — Progress Notes (Addendum)
[facility]  PATIENT: Shannon Gomez DOB: Jan 09, 1914   REASON FOR VISIT: Hospital follow-up for stroke HISTORY FROM: Nephew and patient    HISTORY OF PRESENT ILLNESS:Shannon N Robertsis an 83 y.o.femalewho presents via EMS from [facility] with new onset slurred speech, right facial droop and RUE weakness. Initially noted at 1600 on 03/11/16 but time last normal was unknown. She has a history of dementia.  MRI performed here revealed the following: Small volume acute ischemic left MCA cortical infarct involving the left insula and frontal operculum, seen in association with an abnormal flow void within the left ICA, suspected to be occluded. There is a 2 cm densely calcified meningioma overlying the left temporal lobe without associated edema. Prominent generalized age-related cerebral atrophy with mild chronic small vessel ischemic disease is also noted. Patient was not administered IV t-PA secondary to unknown LKW. She was admitted for further evaluation and treatment. Carotid ultrasound findings are consistent with 60-79% stenosis involving the right internal carotid artery 80-99% stenosis involving the left internal carotid artery. Lower extremity venous Doppler without evidence of deep or superficial facial vein thrombosis involving the lower extremities. 2-D echo with EF 65-70% LDL 107 and Lipitor added. No antithrombotic or to admission, now on aspirin 325 and Plavix.. On return visit to the stroke clinic today with her nephew she cannot provide a history. She denies that she has been in the hospital. She has lived at [facility] for several years. She has not had further stroke or TIA symptoms according to the information received from [facility]. The nephew has little knowledge of her hospitalization , or that she had a stroke however he has her power of attorney       REVIEW OF SYSTEMS: Full 14 system review of systems performed and notable only  for those listed, all others are neg:  Constitutional: neg  Cardiovascular: neg Ear/Nose/Throat: neg  Skin: neg Eyes: neg Respiratory: neg Gastroitestinal: neg  Hematology/Lymphatic: neg  Endocrine: neg Musculoskeletal:neg Allergy/Immunology: neg Neurological: Memory loss, stroke  Psychiatric: neg Sleep : neg   ALLERGIES: Allergies  Allergen Reactions  . Sulfonamide Derivatives Other (See Comments)    On MAR    HOME MEDICATIONS: Outpatient Medications Prior to Visit  Medication Sig Dispense Refill  . Acetaminophen 325 MG CAPS Take 650 mg by mouth every 6 (six) hours as needed (pain).     Marland Kitchen amLODipine (NORVASC) 5 MG tablet Take 0.5 tablets (2.5 mg total) by mouth daily. 10 tablet 0  . aspirin 325 MG tablet Take 1 tablet (325 mg total) by mouth daily. (Patient taking differently: Take 325 mg by mouth daily. Discontinue 06/15/2016) 90 tablet 0  . atorvastatin (LIPITOR) 20 MG tablet Take 1 tablet (20 mg total) by mouth daily at 6 PM. 30 tablet 0  . calcium carbonate (OS-CAL) 600 MG TABS tablet Take 600 mg by mouth 2 (two) times daily with a meal.    . clopidogrel (PLAVIX) 75 MG tablet Take 1 tablet (75 mg total) by mouth daily. 60 tablet 0  . ketoconazole (NIZORAL) 2 % shampoo Apply 1 application topically 2 (two) times a week. On bath days    . levothyroxine (SYNTHROID, LEVOTHROID) 25 MCG tablet Take 25 mcg by mouth daily before breakfast.     . sennosides-docusate sodium (SENOKOT-S) 8.6-50 MG tablet Take 1 tablet by mouth 2 (two) times daily.    Marland Kitchen zinc oxide (BALMEX) 11.3 % CREA cream Apply 1 application topically. Every shift    .  OVER THE COUNTER MEDICATION Take 120 mLs by mouth 2 (two) times daily. "medpass"-protein supplement given when taking meds      No facility-administered medications prior to visit.     PAST MEDICAL HISTORY: Past Medical History:  Diagnosis Date  . Chronic kidney disease   . Chronic pain   . COLONIC POLYPS, HX OF 01/01/2008   Qualifier: Diagnosis  of  By: Andrey Campanile MD, Raliegh Ip    . Dementia   . DIVERTICULITIS, HX OF 01/01/2008   Qualifier: Diagnosis of  By: Andrey Campanile MD, Raliegh Ip    . GASTRIC ULCER 01/01/2008   Qualifier: Diagnosis of  By: Andrey Campanile MD, Raliegh Ip    . GERD (gastroesophageal reflux disease)   . HCAP (healthcare-associated pneumonia) 05/17/2013  . Hypertension   . Thyroid disease   . Vitamin D deficiency     PAST SURGICAL HISTORY: History reviewed. No pertinent surgical history.  FAMILY HISTORY: History reviewed. No pertinent family history.  SOCIAL HISTORY: Social History   Social History  . Marital status: Divorced    Spouse name: N/A  . Number of children: N/A  . Years of education: N/A   Occupational History  . Not on file.   Social History Main Topics  . Smoking status: Never Smoker  . Smokeless tobacco: Never Used  . Alcohol use Not on file  . Drug use: Unknown  . Sexual activity: No   Other Topics Concern  . Not on file   Social History Narrative   Pt is at [facility].       PHYSICAL EXAM  Vitals:   05/08/16 1345 05/08/16 1453  BP: (!) 171/70 (!) 159/73  Pulse: 67 65  Height: 5' (1.524 m)    There is no height or weight on file to calculate BMI.  Generalized: Well developed, in no acute distress  Head: normocephalic and atraumatic,. Oropharynx benign  Neck: Supple, no carotid bruits  Cardiac: Regular rate rhythm,  Musculoskeletal: No deformity   Neurological examination   Mentation: Alert , Oriented to place or time expressive  Aphasia, severe dysarthria has problems following commands.   Cranial nerve II-XII: Unable to cooperate for funduscopic .Pupils were equal round reactive to light extraocular movements were full, visual field were full on confrontational test. Mild right facial droop , hearing was intact to finger rubbing bilaterally. Uvula tongue midline. head turning and shoulder shrug were normal and symmetric.. Motor: Moves all extremities  symmetrically Sensory: Withdraws to pain exam otherwise unreliable Coordination: Exam unreliable Reflexes: 1+ upper lower and symmetric plantar responses were flexor bilaterally. Gait and Station: In wheelchair not ambulated  DIAGNOSTIC DATA (LABS, IMAGING, TESTING) - I reviewed patient records, labs, notes, testing and imaging myself where available.  Lab Results  Component Value Date   WBC 11.3 (H) 03/14/2016   HGB 14.4 03/14/2016   HCT 41.6 03/14/2016   MCV 90.2 03/14/2016   PLT 234 03/14/2016      Component Value Date/Time   NA 135 03/14/2016 0350   NA 140 02/28/2016   K 3.6 03/14/2016 0350   CL 100 (L) 03/14/2016 0350   CO2 26 03/14/2016 0350   GLUCOSE 94 03/14/2016 0350   BUN 19 03/14/2016 0350   BUN 24 (A) 02/28/2016   CREATININE 0.89 03/14/2016 0350   CALCIUM 9.1 03/14/2016 0350   PROT 6.8 03/11/2016 1919   ALBUMIN 3.4 (L) 03/11/2016 1919   AST 49 (H) 03/11/2016 1919   ALT 17 03/11/2016 1919   ALKPHOS 82 03/11/2016 1919  BILITOT 1.4 (H) 03/11/2016 1919   GFRNONAA 51 (L) 03/14/2016 0350   GFRAA 60 (L) 03/14/2016 0350   Lab Results  Component Value Date   CHOL 158 03/12/2016   HDL 38 (L) 03/12/2016   LDLCALC 107 (H) 03/12/2016   TRIG 66 03/12/2016   CHOLHDL 4.2 03/12/2016   Lab Results  Component Value Date   HGBA1C 5.3 03/13/2016   No results found for: [id number] Lab Results  Component Value Date   TSH 4.247 03/12/2016      ASSESSMENT AND PLAN  [age] y.o. year old female  has a past medical history of Chronic kidney disease; Chronic pain; COLONIC POLYPS, HX OF (01/01/2008); Dementia;  Hypertension; Thyroid disease; and Vitamin D deficiency. Here for stroke to follow-up .Acute ischemic left MCA cortical infarct involving the left insula and frontal operculum, seen in association with an abnormal flow void within the left ICA, suspected to be occluded in November 2017.The patient is a current patient of Dr. Roda ShuttersXu  who is out of the office today . This note  is sent to the work in doctor.     PLAN.Stressed the importance of management of risk factors to prevent further stroke to the nephew Continue Plavix and aspirinfor secondary stroke prevention for 3 months then Plavix alone Maintain strict control of hypertension with blood pressure goal below 130/90, today's reading 159/73 continue antihypertensive medications Control of diabetes with hemoglobin A1c below 6.5 followed by primary care most recent hemoglobin A1c5.3 Cholesterol with LDL cholesterol less than 70, followed by primary care,  most recent107 continue Lipitor eat healthy diet with whole grains,  fresh fruits and vegetables Discussed risk for recurrent stroke/ TIA and answered additional questions This was a prolonged visit requiring 30minutes and medical decision making of high complexity with extensive review of history, hospital chart, counseling and answering questions Nilda RiggsNancy Carolyn Martin, [facility] ClinicsGNP, [facility] InstituteBC, APRN  [facility] [address], [address] [place], [place] [address] [phone]  I reviewed the above note and documentation by the Nurse Practitioner and agree with the history, physical exam, assessment and plan as outlined above. I was immediately available for face-to-face consultation. Huston FoleySaima Athar, MD, PhD [facility]

## 2016-05-08 NOTE — Patient Instructions (Addendum)
Per skilled sheet 

## 2016-05-16 ENCOUNTER — Non-Acute Institutional Stay (SKILLED_NURSING_FACILITY): Payer: Medicare Other | Admitting: Family

## 2016-05-16 DIAGNOSIS — K5901 Slow transit constipation: Secondary | ICD-10-CM | POA: Diagnosis not present

## 2016-05-16 DIAGNOSIS — I10 Essential (primary) hypertension: Secondary | ICD-10-CM

## 2016-05-16 DIAGNOSIS — E039 Hypothyroidism, unspecified: Secondary | ICD-10-CM | POA: Diagnosis not present

## 2016-05-16 DIAGNOSIS — I6523 Occlusion and stenosis of bilateral carotid arteries: Secondary | ICD-10-CM

## 2016-05-16 NOTE — Progress Notes (Signed)
Location:  [facility] Room Number: 901 A  Place of Service:  SNF (31) Provider: Lyzbeth Genrich FNP-C   Oneal Grout, MD  Patient Care Team: Oneal Grout, MD as PCP - General (Internal Medicine) Sharee Holster, NP as Nurse Practitioner (Nurse Practitioner)  Extended Emergency Contact Information Primary Emergency Contact: Neese,Bill Address: [address]          [place], [place] [address] D[place] of [place] Home Phone: [phone] Mobile Phone: [phone] Relation: Nephew Secondary Emergency Contact: Vanessa Barbara [place] of [place] Home Phone: [phone] Mobile Phone: [phone] Relation: Friend  Code Status: DNR  Goals of care: Advanced Directive information Advanced Directives 01/29/2016  Does Patient Have a Medical Advance Directive? Yes  Type of Advance Directive Out of facility DNR (pink MOST or yellow form)  Does patient want to make changes to medical advance directive? -  Copy of Healthcare Power of Attorney in Chart? Yes     Chief Complaint  Patient presents with  . Medical Management of Chronic Issues    HPI:  Pt is a 83 y.o. female seen today at [facility] for medical management of chronic diseases. She has a medical history of HTN, Stroke, hypothyroidism, CAD,  COPD, GERD among other conditions. She is seen in her room today lying in bed. She denies any acute issues this visit. She has had no recent weight changes or fall episodes. Facility Nurse reports no new concerns.    Past Medical History:  Diagnosis Date  . Chronic kidney disease   . Chronic pain   . COLONIC POLYPS, HX OF 01/01/2008   Qualifier: Diagnosis of  By: Andrey Campanile MD, Raliegh Ip    . Dementia   . DIVERTICULITIS, HX OF 01/01/2008   Qualifier: Diagnosis of  By: Andrey Campanile MD, Raliegh Ip    . GASTRIC ULCER 01/01/2008   Qualifier: Diagnosis of  By: Andrey Campanile MD, Raliegh Ip    . GERD (gastroesophageal reflux disease)   . HCAP  (healthcare-associated pneumonia) 05/17/2013  . Hypertension   . Thyroid disease   . Vitamin D deficiency    No past surgical history on file.  Allergies  Allergen Reactions  . Sulfonamide Derivatives Other (See Comments)    On MAR    Allergies as of 05/16/2016      Reactions   Sulfonamide Derivatives Other (See Comments)   On MAR      Medication List       Accurate as of 05/16/16  5:02 PM. Always use your most recent med list.          Acetaminophen 325 MG Caps Take 650 mg by mouth every 6 (six) hours as needed (pain).   amLODipine 5 MG tablet Commonly known as:  NORVASC Take 0.5 tablets (2.5 mg total) by mouth daily.   aspirin 325 MG tablet Take 1 tablet (325 mg total) by mouth daily.   atorvastatin 20 MG tablet Commonly known as:  LIPITOR Take 1 tablet (20 mg total) by mouth daily at 6 PM.   calcium carbonate 600 MG Tabs tablet Commonly known as:  OS-CAL Take 600 mg by mouth 2 (two) times daily with a meal.   clopidogrel 75 MG tablet Commonly known as:  PLAVIX Take 1 tablet (75 mg total) by mouth daily.   ketoconazole 2 % shampoo Commonly known as:  NIZORAL Apply 1 application topically 2 (two) times a week. On bath days   levothyroxine 25 MCG tablet Commonly known as:  SYNTHROID, LEVOTHROID Take 25 mcg by mouth daily before breakfast.   sennosides-docusate sodium 8.6-50 MG tablet Commonly known as:  SENOKOT-S Take 1 tablet by mouth 2 (two) times daily.   zinc oxide 11.3 % Crea cream Commonly known as:  BALMEX Apply 1 application topically. Every shift       Review of Systems  Unable to perform ROS: Dementia    Immunization History  Administered Date(s) Administered  . Influenza-Unspecified 02/08/2014, 03/03/2015, 02/08/2016  . PPD Test 01/17/2014, 01/10/2015  . Pneumococcal-Unspecified 11/08/2009   Pertinent  Health Maintenance Due  Topic Date Due  . PNA vac Low Risk Adult (2 of 2 - PCV13) 07/01/2016 (Originally 11/09/2010)  . INFLUENZA  VACCINE  Completed  . DEXA SCAN  Completed   No flowsheet data found. Functional Status Survey:    Vitals:   05/16/16 1030  BP: 132/76  Pulse: 75  Resp: 20  Temp: 97.7 F (36.5 C)  SpO2: 94%  Weight: 120 lb 3.2 oz (54.5 kg)  Height: 5' (1.524 m)   Body mass index is 23.47 kg/m. Physical Exam  Constitutional:  Elderly Pleasantly confused at baseline in no acute distress.  HENT:  Head: Normocephalic.  Mouth/Throat: Oropharynx is clear and moist.  Eyes: Conjunctivae and EOM are normal. Pupils are equal, round, and reactive to light. Right eye exhibits no discharge. Left eye exhibits no discharge. No scleral icterus.  Neck: Normal range of motion. No JVD present. No thyromegaly present.  Cardiovascular: Normal rate, regular rhythm, normal heart sounds and intact distal pulses.  Exam reveals no gallop and no friction rub.   No murmur heard. Pulmonary/Chest: Effort normal and breath sounds normal. No respiratory distress. She has no wheezes. She has no rales.  Abdominal: Soft. Bowel sounds are normal. She exhibits no distension. There is no tenderness. There is no rebound and no guarding.  Genitourinary:  Genitourinary Comments: Incontinent for B/B   Musculoskeletal: Normal range of motion. She exhibits no edema or tenderness.  Bed bound   Lymphadenopathy:    She has no cervical adenopathy.  Neurological: She is alert.  Pleasantly confused at baseline.  Skin: Skin is warm and dry. No rash noted. No erythema. No pallor.  No skin breakdown.   Psychiatric: She has a normal mood and affect.    Labs reviewed:  Recent Labs  03/12/16 0640 03/12/16 1510 03/14/16 0350  NA 137 135 135  K 4.2 4.3 3.6  CL 104 103 100*  CO2 23 22 26   GLUCOSE 96 126* 94  BUN 24* 24* 19  CREATININE 0.87 0.98 0.89  CALCIUM 9.0 9.0 9.1    Recent Labs  05/29/15 06/23/15 03/11/16 1919  AST 20 18 49*  ALT 9 7 17   ALKPHOS 62 67 82  BILITOT  --   --  1.4*  PROT  --   --  6.8  ALBUMIN  --    --  3.4*    Recent Labs  03/11/16 1919  03/12/16 0825 03/13/16 0455 03/14/16 0350  WBC 13.3*  --  11.6* 10.8* 11.3*  NEUTROABS 10.4*  --   --   --   --   HGB 15.3*  < > 13.9 13.8 14.4  HCT 44.9  < > 41.0 41.7 41.6  MCV 90.0  --  90.1 91.4 90.2  PLT 239  --  205 219 234  < > = values in this interval not displayed. Lab Results  Component Value Date   TSH 4.247 03/12/2016   Lab Results  Component Value Date   HGBA1C 5.3 03/13/2016   Lab Results  Component Value Date   CHOL 158 03/12/2016   HDL 38 (L) 03/12/2016   LDLCALC 107 (H) 03/12/2016   TRIG 66 03/12/2016   CHOLHDL 4.2 03/12/2016   Assessment/Plan 1. Essential hypertension, benign B/p stable. Will continue on low dose Amlodipine 2.5 mg daily. Monitor BMP.   2. Bilateral carotid artery stenosis Continue on ASA, Plavix and Lipitor.   3. Hypothyroidism, unspecified type Continue on Levothyroxine 25 mcg Tablet. Monitor TSH level.   4. Slow transit constipation Continue on current regimen.     Family/ staff Communication: Reviewed plan of care with facility Nurse supervisor.   Labs/tests ordered: None

## 2016-06-05 DIAGNOSIS — I739 Peripheral vascular disease, unspecified: Secondary | ICD-10-CM | POA: Diagnosis not present

## 2016-06-05 DIAGNOSIS — M79675 Pain in left toe(s): Secondary | ICD-10-CM | POA: Diagnosis not present

## 2016-06-05 DIAGNOSIS — B351 Tinea unguium: Secondary | ICD-10-CM | POA: Diagnosis not present

## 2016-06-05 DIAGNOSIS — M79674 Pain in right toe(s): Secondary | ICD-10-CM | POA: Diagnosis not present

## 2016-06-17 ENCOUNTER — Non-Acute Institutional Stay (SKILLED_NURSING_FACILITY): Payer: Medicare Other | Admitting: Family

## 2016-06-17 ENCOUNTER — Encounter: Payer: Self-pay | Admitting: Family

## 2016-06-17 DIAGNOSIS — I10 Essential (primary) hypertension: Secondary | ICD-10-CM | POA: Diagnosis not present

## 2016-06-17 DIAGNOSIS — E039 Hypothyroidism, unspecified: Secondary | ICD-10-CM

## 2016-06-17 DIAGNOSIS — I6523 Occlusion and stenosis of bilateral carotid arteries: Secondary | ICD-10-CM | POA: Diagnosis not present

## 2016-06-17 NOTE — Progress Notes (Signed)
Location:  [facility] Room Number: 901-A Place of Service:  SNF (31) Provider: Samanth Mirkin FNP-C   Oneal Grout, MD  Patient Care Team: Oneal Grout, MD as PCP - General (Internal Medicine) Sharee Holster, NP as Nurse Practitioner (Nurse Practitioner)  Extended Emergency Contact Information Primary Emergency Contact: Neese,Bill Address: [address]          [place], [place] [address] [place] of [place] Home Phone: [phone] Mobile Phone: [phone] Relation: Nephew Secondary Emergency Contact: Vanessa Barbara [place] of [place] Home Phone: [phone] Mobile Phone: [phone] Relation: Friend  Code Status: DNR  Goals of care: Advanced Directive information Advanced Directives 01/29/2016  Does Patient Have a Medical Advance Directive? Yes  Type of Advance Directive Out of facility DNR (pink MOST or yellow form)  Does patient want to make changes to medical advance directive? -  Copy of Healthcare Power of Attorney in Chart? Yes     Chief Complaint  Patient presents with  . Medical Management of Chronic Issues    Routine Visit     HPI:  Pt is a 83 y.o. female seen today at [facility] and [facility] for medical management of chronic diseases. She has a medical history of HTN, Stroke, hypothyroidism, CAD,  COPD, GERD among other conditions. She is seen in her room today. She denies any acute issues this visit. Facility staff reports no recent weight changes. She has had no recent fall episodes or Hospital admission since prior visit.She was seen by [facility] 05/08/2016 for follow up CVA.   Past Medical History:  Diagnosis Date  . Chronic kidney disease   . Chronic pain   . COLONIC POLYPS, HX OF 01/01/2008   Qualifier: Diagnosis of  By: Andrey Campanile MD, Raliegh Ip    . Dementia   . DIVERTICULITIS, HX OF 01/01/2008   Qualifier: Diagnosis of  By: Andrey Campanile MD, Raliegh Ip    . GASTRIC ULCER 01/01/2008   Qualifier:  Diagnosis of  By: Andrey Campanile MD, Raliegh Ip    . GERD (gastroesophageal reflux disease)   . HCAP (healthcare-associated pneumonia) 05/17/2013  . Hypertension   . Thyroid disease   . Vitamin D deficiency    No past surgical history on file.  Allergies  Allergen Reactions  . Sulfonamide Derivatives Other (See Comments)    On MAR    Allergies as of 06/17/2016      Reactions   Sulfonamide Derivatives Other (See Comments)   On MAR      Medication List       Accurate as of 06/17/16 11:45 AM. Always use your most recent med list.          Acetaminophen 325 MG Caps Take 650 mg by mouth every 6 (six) hours as needed (pain).   amLODipine 5 MG tablet Commonly known as:  NORVASC Take 0.5 tablets (2.5 mg total) by mouth daily.   atorvastatin 20 MG tablet Commonly known as:  LIPITOR Take 1 tablet (20 mg total) by mouth daily at 6 PM.   calcium carbonate 600 MG Tabs tablet Commonly known as:  OS-CAL Take 600 mg by mouth 2 (two) times daily with a meal.   clopidogrel 75 MG tablet Commonly known as:  PLAVIX Take 1 tablet (75 mg total) by mouth daily.   ketoconazole 2 % shampoo Commonly known as:  NIZORAL Apply 1 application topically 2 (two) times a week. On bath days   levothyroxine 25 MCG tablet Commonly known as:  SYNTHROID, LEVOTHROID  Take 25 mcg by mouth daily before breakfast.   sennosides-docusate sodium 8.6-50 MG tablet Commonly known as:  SENOKOT-S Take 1 tablet by mouth 2 (two) times daily. Take daily at bedtime   UNABLE TO FIND Med Name: Med pass 90 mL by mouth 3 times daily   zinc oxide 11.3 % Crea cream Commonly known as:  BALMEX Apply 1 application topically. Every shift       Review of Systems  Unable to perform ROS: Dementia    Immunization History  Administered Date(s) Administered  . Influenza-Unspecified 02/08/2014, 03/03/2015, 02/08/2016  . PPD Test 01/17/2014, 01/10/2015  . Pneumococcal-Unspecified 11/08/2009   Pertinent  Health Maintenance  Due  Topic Date Due  . PNA vac Low Risk Adult (2 of 2 - PCV13) 07/01/2016 (Originally 11/09/2010)  . INFLUENZA VACCINE  Completed  . DEXA SCAN  Completed    Vitals:   06/17/16 1137  BP: 126/65  Pulse: (!) 51  Resp: 20  Temp: 97.9 F (36.6 C)  TempSrc: Oral  SpO2: 96%  Weight: 118 lb (53.5 kg)  Height: 5' (1.524 m)   Body mass index is 23.05 kg/m. Physical Exam  Constitutional:  Pleasantly confused at baseline.  HENT:  Head: Normocephalic.  Mouth/Throat: Oropharynx is clear and moist.  Eyes: Conjunctivae and EOM are normal. Pupils are equal, round, and reactive to light. Right eye exhibits no discharge. Left eye exhibits no discharge. No scleral icterus.  Neck: Normal range of motion. No JVD present. No thyromegaly present.  Cardiovascular: Normal rate, regular rhythm, normal heart sounds and intact distal pulses.  Exam reveals no gallop and no friction rub.   No murmur heard. Pulmonary/Chest: Effort normal and breath sounds normal. No respiratory distress. She has no wheezes. She has no rales.  Abdominal: Soft. Bowel sounds are normal. She exhibits no distension. There is no tenderness. There is no rebound and no guarding.  Genitourinary:  Genitourinary Comments: Incontinent for B/B   Musculoskeletal: Normal range of motion. She exhibits no edema or tenderness.  Bed bound. Moves x 4 extremities.    Lymphadenopathy:    She has no cervical adenopathy.  Neurological: She is alert.  Pleasantly confused at baseline.  Skin: Skin is warm and dry. No rash noted. No erythema. No pallor.  Skin intact.   Psychiatric: She has a normal mood and affect.    Labs reviewed:  Recent Labs  03/12/16 0640 03/12/16 1510 03/14/16 0350 03/20/16  NA 137 135 135 138  K 4.2 4.3 3.6 4.7  CL 104 103 100*  --   CO2 23 22 26   --   GLUCOSE 96 126* 94  --   BUN 24* 24* 19 23*  CREATININE 0.87 0.98 0.89 0.9  CALCIUM 9.0 9.0 9.1  --     Recent Labs  06/23/15 03/11/16 1919  AST 18 49*    ALT 7 17  ALKPHOS 67 82  BILITOT  --  1.4*  PROT  --  6.8  ALBUMIN  --  3.4*    Recent Labs  03/11/16 1919  03/12/16 0825 03/13/16 0455 03/14/16 0350 03/20/16  WBC 13.3*  --  11.6* 10.8* 11.3* 9.9  NEUTROABS 10.4*  --   --   --   --   --   HGB 15.3*  < > 13.9 13.8 14.4 14.1  HCT 44.9  < > 41.0 41.7 41.6 43  MCV 90.0  --  90.1 91.4 90.2  --   PLT 239  --  205 219 234 204  < > =  values in this interval not displayed. Lab Results  Component Value Date   TSH 4.247 03/12/2016   Lab Results  Component Value Date   HGBA1C 5.3 03/13/2016   Lab Results  Component Value Date   CHOL 158 03/12/2016   HDL 38 (L) 03/12/2016   LDLCALC 107 (H) 03/12/2016   TRIG 66 03/12/2016   CHOLHDL 4.2 03/12/2016   Assessment/Plan 1. Essential hypertension, benign B/p at goal.continue on low dose Amlodipine 2.5 mg daily. Monitor BMP.   2. Bilateral carotid artery stenosis Continue on Plavix and Lipitor.  3. Hypothyroidism, unspecified type Continue on Levothyroxine 25 mcg Tablet. Monitor TSH level.    Family/ staff Communication: Reviewed plan of care with facility Nurse supervisor.   Labs/tests ordered: None

## 2016-06-18 DIAGNOSIS — Z79899 Other long term (current) drug therapy: Secondary | ICD-10-CM | POA: Diagnosis not present

## 2016-06-18 LAB — BASIC METABOLIC PANEL
BUN: 25 mg/dL — AB (ref 4–21)
Creatinine: 0.9 mg/dL (ref 0.5–1.1)
GLUCOSE: 93 mg/dL
POTASSIUM: 4.5 mmol/L (ref 3.4–5.3)
Sodium: 141 mmol/L (ref 137–147)

## 2016-06-18 LAB — CBC AND DIFFERENTIAL
HCT: 46 % (ref 36–46)
HEMOGLOBIN: 15.4 g/dL (ref 12.0–16.0)
PLATELETS: 240 10*3/uL (ref 150–399)
WBC: 10.7 10*3/mL

## 2016-06-18 LAB — TSH: TSH: 4.21 u[IU]/mL (ref 0.41–5.90)

## 2016-07-10 DIAGNOSIS — E039 Hypothyroidism, unspecified: Secondary | ICD-10-CM | POA: Diagnosis not present

## 2016-07-10 DIAGNOSIS — I158 Other secondary hypertension: Secondary | ICD-10-CM | POA: Diagnosis not present

## 2016-07-10 DIAGNOSIS — E58 Dietary calcium deficiency: Secondary | ICD-10-CM | POA: Diagnosis not present

## 2016-07-10 LAB — BASIC METABOLIC PANEL
Creatinine: 0.9 mg/dL (ref 0.5–1.1)
Glucose: 86 mg/dL
POTASSIUM: 4.2 mmol/L (ref 3.4–5.3)
SODIUM: 140 mmol/L (ref 137–147)

## 2016-07-10 LAB — TSH: TSH: 3.14 u[IU]/mL (ref 0.41–5.90)

## 2016-07-10 LAB — VITAMIN D 25 HYDROXY (VIT D DEFICIENCY, FRACTURES): VIT D 25 HYDROXY: 66.73

## 2016-07-12 ENCOUNTER — Encounter: Payer: Self-pay | Admitting: Family

## 2016-07-12 ENCOUNTER — Non-Acute Institutional Stay (SKILLED_NURSING_FACILITY): Payer: Medicare Other | Admitting: Family

## 2016-07-12 DIAGNOSIS — E559 Vitamin D deficiency, unspecified: Secondary | ICD-10-CM

## 2016-07-12 DIAGNOSIS — F0390 Unspecified dementia without behavioral disturbance: Secondary | ICD-10-CM | POA: Diagnosis not present

## 2016-07-12 DIAGNOSIS — I10 Essential (primary) hypertension: Secondary | ICD-10-CM | POA: Diagnosis not present

## 2016-07-12 DIAGNOSIS — E039 Hypothyroidism, unspecified: Secondary | ICD-10-CM

## 2016-07-12 NOTE — Progress Notes (Signed)
Location:  [facility] Room Number: 901A Place of Service:  SNF [id number]) Provider:  Richarda Blade, NP  Oneal Grout, MD  Patient Care Team: Oneal Grout, MD as PCP - General (Internal Medicine) Caesar Bookman, NP as Nurse Practitioner (Geriatric Medicine)  Extended Emergency Contact Information Primary Emergency Contact: Neese,Bill Address: [address]          [place], [place] [address] [place] of [place] Home Phone: [phone] Mobile Phone: [phone] Relation: Nephew Secondary Emergency Contact: Vanessa Barbara [place] of [place] Home Phone: [phone] Mobile Phone: [phone] Relation: Friend  Code Status:  DNF Goals of care: Advanced Directive information Advanced Directives 07/12/2016  Does Patient Have a Medical Advance Directive? Yes  Type of Estate agent of [place];Out of facility DNR (pink MOST or yellow form)  Does patient want to make changes to medical advance directive? -  Copy of Healthcare Power of Attorney in Chart? -  Pre-existing out of facility DNR order (yellow form or pink MOST form) Yellow form placed in chart (order not valid for inpatient use)     Chief Complaint  Patient presents with  . Medical Management of Chronic Issues    routine visit    HPI:  Pt is a 83 y.o. female seen today at [facility] for medical management of chronic diseases. She has a medical history of HTN,CAD,Stroke  hypothyroidism, COPD, GERD among other conditions. She is seen in her room today. She denies any acute issues this visit though history limited due to dementia.She has had a four pound weight loss over one month. She continues to follow up with registered dietician. No skin break down.    Past Medical History:  Diagnosis Date  . Chronic kidney disease   . Chronic pain   . COLONIC POLYPS, HX OF 01/01/2008   Qualifier: Diagnosis of  By: Andrey Campanile MD, Raliegh Ip    . Dementia     . DIVERTICULITIS, HX OF 01/01/2008   Qualifier: Diagnosis of  By: Andrey Campanile MD, Raliegh Ip    . GASTRIC ULCER 01/01/2008   Qualifier: Diagnosis of  By: Andrey Campanile MD, Raliegh Ip    . GERD (gastroesophageal reflux disease)   . HCAP (healthcare-associated pneumonia) 05/17/2013  . Hypertension   . Thyroid disease   . Vitamin D deficiency    History reviewed. No pertinent surgical history.  Allergies  Allergen Reactions  . Sulfonamide Derivatives Other (See Comments)    On MAR    Allergies as of 07/12/2016      Reactions   Sulfonamide Derivatives Other (See Comments)   On MAR      Medication List       Accurate as of 07/12/16  6:01 PM. Always use your most recent med list.          Acetaminophen 325 MG Caps Take 650 mg by mouth every 6 (six) hours as needed (pain).   amLODipine 5 MG tablet Commonly known as:  NORVASC Take 0.5 tablets (2.5 mg total) by mouth daily.   atorvastatin 20 MG tablet Commonly known as:  LIPITOR Take 1 tablet (20 mg total) by mouth daily at 6 PM.   calcium carbonate 600 MG Tabs tablet Commonly known as:  OS-CAL Take 600 mg by mouth 2 (two) times daily with a meal.   clopidogrel 75 MG tablet Commonly known as:  PLAVIX Take 1 tablet (75 mg total) by mouth daily.   ketoconazole 2 % shampoo Commonly known as:  NIZORAL Apply 1 application topically 2 (two) times a week. On bath days   levothyroxine 25 MCG tablet Commonly known as:  SYNTHROID, LEVOTHROID Take 25 mcg by mouth daily before breakfast.   sennosides-docusate sodium 8.6-50 MG tablet Commonly known as:  SENOKOT-S Take 1 tablet by mouth 2 (two) times daily. Take daily at bedtime   UNABLE TO FIND Med Name: Med pass 90 mL by mouth 3 times daily   zinc oxide 11.3 % Crea cream Commonly known as:  BALMEX Apply 1 application topically. Every shift       Review of Systems  Unable to perform ROS: Dementia    Immunization History  Administered Date(s) Administered  . Influenza-Unspecified  02/08/2014, 03/03/2015, 02/08/2016  . PPD Test 01/17/2014, 01/10/2015  . Pneumococcal-Unspecified 11/08/2009   Pertinent  Health Maintenance Due  Topic Date Due  . PNA vac Low Risk Adult (2 of 2 - PCV13) 11/09/2010  . INFLUENZA VACCINE  Completed  . DEXA SCAN  Completed     Vitals:   07/12/16 1335  BP: (!) 142/83  Pulse: 61  Resp: 17  Temp: 98.2 F (36.8 C)  SpO2: 98%  Weight: 114 lb (51.7 kg)  Height: 5' (1.524 m)   Body mass index is 22.26 kg/m. Physical Exam  Constitutional:  Elderly   HENT:  Head: Normocephalic.  Mouth/Throat: Oropharynx is clear and moist.  Eyes: Conjunctivae and EOM are normal. Pupils are equal, round, and reactive to light. Right eye exhibits no discharge. Left eye exhibits no discharge. No scleral icterus.  Neck: Normal range of motion. No JVD present. No thyromegaly present.  Cardiovascular: Normal rate, regular rhythm, normal heart sounds and intact distal pulses.  Exam reveals no gallop and no friction rub.   No murmur heard. Pulmonary/Chest: Effort normal and breath sounds normal. No respiratory distress. She has no wheezes. She has no rales.  Abdominal: Soft. Bowel sounds are normal. She exhibits no distension. There is no tenderness. There is no rebound and no guarding.  Genitourinary:  Genitourinary Comments: Incontinent for B/B   Musculoskeletal: Normal range of motion. She exhibits no edema, tenderness or deformity.  Moves x 4 extremities.    Lymphadenopathy:    She has no cervical adenopathy.  Neurological: She is alert.  Pleasantly confused at baseline.  Skin: Skin is warm and dry. No rash noted. No erythema. No pallor.  No skin breakdown   Psychiatric: She has a normal mood and affect.    Labs reviewed:  Recent Labs  03/12/16 0640 03/12/16 1510 03/14/16 0350 03/20/16 07/10/16  NA 137 135 135 138 140  K 4.2 4.3 3.6 4.7 4.2  CL 104 103 100*  --   --   CO2 23 22 26   --   --   GLUCOSE 96 126* 94  --   --   BUN 24* 24* 19  23*  --   CREATININE 0.87 0.98 0.89 0.9 0.9  CALCIUM 9.0 9.0 9.1  --   --     Recent Labs  03/11/16 1919  AST 49*  ALT 17  ALKPHOS 82  BILITOT 1.4*  PROT 6.8  ALBUMIN 3.4*    Recent Labs  03/11/16 1919  03/12/16 0825 03/13/16 0455 03/14/16 0350 03/20/16  WBC 13.3*  --  11.6* 10.8* 11.3* 9.9  NEUTROABS 10.4*  --   --   --   --   --   HGB 15.3*  < > 13.9 13.8 14.4 14.1  HCT 44.9  < > 41.0 41.7  41.6 43  MCV 90.0  --  90.1 91.4 90.2  --   PLT 239  --  205 219 234 204  < > = values in this interval not displayed. Lab Results  Component Value Date   TSH 3.14 07/10/2016   Lab Results  Component Value Date   HGBA1C 5.3 03/13/2016   Lab Results  Component Value Date   CHOL 158 03/12/2016   HDL 38 (L) 03/12/2016   LDLCALC 107 (H) 03/12/2016   TRIG 66 03/12/2016   CHOLHDL 4.2 03/12/2016   Assessment/Plan Hypothyroidism  Lab Results  Component Value Date   TSH 3.14 07/10/2016  Continue on Levothyroxine. Monitor TSH level.   HTN B/p stable. Continue on Amlodipine. Monitor BMP.   Vit D deficiency Recent level within range. Continue on vit D supplements. Continue to monitor level.   Dementia without behavioral disturbance  No new behavioral issues reported. Decline expected. Continue to assist with ADL's. Encourage oral and fluid intake.    Family/ staff Communication: Reviewed plan of care with Nurse supervisor.   Labs/tests ordered:  None

## 2016-07-19 DIAGNOSIS — R1312 Dysphagia, oropharyngeal phase: Secondary | ICD-10-CM | POA: Diagnosis not present

## 2016-07-22 DIAGNOSIS — R1312 Dysphagia, oropharyngeal phase: Secondary | ICD-10-CM | POA: Diagnosis not present

## 2016-07-23 DIAGNOSIS — R1312 Dysphagia, oropharyngeal phase: Secondary | ICD-10-CM | POA: Diagnosis not present

## 2016-07-24 DIAGNOSIS — R1312 Dysphagia, oropharyngeal phase: Secondary | ICD-10-CM | POA: Diagnosis not present

## 2016-07-25 DIAGNOSIS — R1312 Dysphagia, oropharyngeal phase: Secondary | ICD-10-CM | POA: Diagnosis not present

## 2016-07-26 DIAGNOSIS — R1312 Dysphagia, oropharyngeal phase: Secondary | ICD-10-CM | POA: Diagnosis not present

## 2016-07-29 DIAGNOSIS — R1312 Dysphagia, oropharyngeal phase: Secondary | ICD-10-CM | POA: Diagnosis not present

## 2016-07-30 DIAGNOSIS — R1312 Dysphagia, oropharyngeal phase: Secondary | ICD-10-CM | POA: Diagnosis not present

## 2016-07-31 DIAGNOSIS — R1312 Dysphagia, oropharyngeal phase: Secondary | ICD-10-CM | POA: Diagnosis not present

## 2016-08-01 DIAGNOSIS — R1312 Dysphagia, oropharyngeal phase: Secondary | ICD-10-CM | POA: Diagnosis not present

## 2016-08-02 DIAGNOSIS — R1312 Dysphagia, oropharyngeal phase: Secondary | ICD-10-CM | POA: Diagnosis not present

## 2016-08-05 DIAGNOSIS — R1312 Dysphagia, oropharyngeal phase: Secondary | ICD-10-CM | POA: Diagnosis not present

## 2016-08-06 DIAGNOSIS — R1312 Dysphagia, oropharyngeal phase: Secondary | ICD-10-CM | POA: Diagnosis not present

## 2016-08-07 DIAGNOSIS — R1312 Dysphagia, oropharyngeal phase: Secondary | ICD-10-CM | POA: Diagnosis not present

## 2016-08-08 DIAGNOSIS — R1312 Dysphagia, oropharyngeal phase: Secondary | ICD-10-CM | POA: Diagnosis not present

## 2016-08-14 ENCOUNTER — Non-Acute Institutional Stay (SKILLED_NURSING_FACILITY): Payer: Medicare Other | Admitting: Family

## 2016-08-14 ENCOUNTER — Encounter: Payer: Self-pay | Admitting: Family

## 2016-08-14 DIAGNOSIS — I10 Essential (primary) hypertension: Secondary | ICD-10-CM

## 2016-08-14 DIAGNOSIS — E039 Hypothyroidism, unspecified: Secondary | ICD-10-CM | POA: Diagnosis not present

## 2016-08-14 DIAGNOSIS — K5901 Slow transit constipation: Secondary | ICD-10-CM | POA: Diagnosis not present

## 2016-08-14 NOTE — Progress Notes (Signed)
Location:  [facility] Room Number: 901-A Place of Service:  SNF (31) Provider:  Richarda Blade, NP  Oneal Grout, MD  Patient Care Team: Oneal Grout, MD as PCP - General (Internal Medicine) Caesar Bookman, NP as Nurse Practitioner (Geriatric Medicine)  Extended Emergency Contact Information Primary Emergency Contact: Neese,Bill Address: [address]          [place], [place] [address] Darden [place] of [place] Home Phone: [phone] Mobile Phone: [phone] Relation: Nephew Secondary Emergency Contact: Vanessa Barbara [place] of [place] Home Phone: [phone] Mobile Phone: [phone] Relation: Friend  Code Status:  DNF Goals of care: Advanced Directive information Advanced Directives 07/12/2016  Does Patient Have a Medical Advance Directive? Yes  Type of Estate agent of [place];Out of facility DNR (pink MOST or yellow form)  Does patient want to make changes to medical advance directive? -  Copy of Healthcare Power of Attorney in Chart? -  Pre-existing out of facility DNR order (yellow form or pink MOST form) Yellow form placed in chart (order not valid for inpatient use)     Chief Complaint  Patient presents with  . Medical Management of Chronic Issues    Routine Visit     HPI:  Pt is a 83 y.o. female seen today at [facility] for medical management of chronic diseases. She has a medical history of HTN,CAD,Stroke  hypothyroidism, COPD, GERD among other conditions.She is seen in her room today. She has had no recent weight changes or fall episodes. She is awake in bed but pleasant confused unable to provider HPI and ROS.Facility Nurse reports no new concerns.she has had no skin breakdown. No recent hospital admission.   Past Medical History:  Diagnosis Date  . Chronic kidney disease   . Chronic pain   . COLONIC POLYPS, HX OF 01/01/2008   Qualifier: Diagnosis of  By: Andrey Campanile MD,  Raliegh Ip    . Dementia   . DIVERTICULITIS, HX OF 01/01/2008   Qualifier: Diagnosis of  By: Andrey Campanile MD, Raliegh Ip    . GASTRIC ULCER 01/01/2008   Qualifier: Diagnosis of  By: Andrey Campanile MD, Raliegh Ip    . GERD (gastroesophageal reflux disease)   . HCAP (healthcare-associated pneumonia) 05/17/2013  . Hypertension   . Thyroid disease   . Vitamin D deficiency    History reviewed. No pertinent surgical history.  Allergies  Allergen Reactions  . Sulfonamide Derivatives Other (See Comments)    On MAR    Allergies as of 08/14/2016      Reactions   Sulfonamide Derivatives Other (See Comments)   On MAR      Medication List       Accurate as of 08/14/16 10:54 PM. Always use your most recent med list.          Acetaminophen 325 MG Caps Take 650 mg by mouth every 6 (six) hours as needed (pain).   amLODipine 5 MG tablet Commonly known as:  NORVASC Take 0.5 tablets (2.5 mg total) by mouth daily.   atorvastatin 20 MG tablet Commonly known as:  LIPITOR Take 1 tablet (20 mg total) by mouth daily at 6 PM.   calcium carbonate 600 MG Tabs tablet Commonly known as:  OS-CAL Take 600 mg by mouth 2 (two) times daily with a meal.   clopidogrel 75 MG tablet Commonly known as:  PLAVIX Take 1 tablet (75 mg total) by mouth daily.   ketoconazole 2 % shampoo Commonly known as:  NIZORAL Apply 1 application topically 2 (two) times a week. On bath days   levothyroxine 25 MCG tablet Commonly known as:  SYNTHROID, LEVOTHROID Take 25 mcg by mouth daily before breakfast.   sennosides-docusate sodium 8.6-50 MG tablet Commonly known as:  SENOKOT-S Take 1 tablet by mouth 2 (two) times daily. \   UNABLE TO FIND Med Name: Med pass 90 mL by mouth 3 times daily   zinc oxide 11.3 % Crea cream Commonly known as:  BALMEX Apply 1 application topically. Every shift       Review of Systems  Unable to perform ROS: Dementia    Immunization History  Administered Date(s) Administered  .  Influenza-Unspecified 02/08/2014, 03/03/2015, 02/08/2016  . PPD Test 01/17/2014, 01/10/2015  . Pneumococcal-Unspecified 11/08/2009   Pertinent  Health Maintenance Due  Topic Date Due  . PNA vac Low Risk Adult (2 of 2 - PCV13) 05/06/2017 (Originally 11/09/2010)  . INFLUENZA VACCINE  12/04/2016  . DEXA SCAN  Completed     Vitals:   08/14/16 1029  BP: 136/72  Pulse: (!) 50  Resp: 18  Temp: 98.1 F (36.7 C)  SpO2: 97%  Weight: 118 lb 9.6 oz (53.8 kg)  Height: 5' (1.524 m)   Body mass index is 23.16 kg/m. Physical Exam  Constitutional:  Pleasantly confused at her baseline.    HENT:  Head: Normocephalic.  Mouth/Throat: Oropharynx is clear and moist.  Eyes: Conjunctivae and EOM are normal. Pupils are equal, round, and reactive to light. Right eye exhibits no discharge. Left eye exhibits no discharge. No scleral icterus.  Neck: Normal range of motion. No JVD present. No thyromegaly present.  Cardiovascular: Normal rate, regular rhythm, normal heart sounds and intact distal pulses.  Exam reveals no gallop and no friction rub.   No murmur heard. Pulmonary/Chest: Effort normal and breath sounds normal. No respiratory distress. She has no wheezes. She has no rales.  Abdominal: Soft. Bowel sounds are normal. She exhibits no distension. There is no tenderness. There is no rebound and no guarding.  Genitourinary:  Genitourinary Comments: Incontinent for B/B   Musculoskeletal: Normal range of motion. She exhibits no edema, tenderness or deformity.  Moves x 4 extremities.spends most time in bed  Lymphadenopathy:    She has no cervical adenopathy.  Neurological: She is alert.  Pleasantly confused at baseline.  Skin: Skin is warm and dry. No rash noted. No erythema. No pallor.  Skin intact.  Psychiatric: She has a normal mood and affect.    Labs reviewed:  Recent Labs  03/12/16 0640 03/12/16 1510 03/14/16 0350 03/20/16 06/18/16 07/10/16  NA 137 135 135 138 141 140  K 4.2 4.3 3.6  4.7 4.5 4.2  CL 104 103 100*  --   --   --   CO2 --   --   --   GLUCOSE 96 126* 94  --   --   --   BUN 24* 24* 19 23* 25*  --   CREATININE 0.87 0.98 0.89 0.9 0.9 0.9  CALCIUM 9.0 9.0 9.1  --   --   --     Recent Labs  03/11/16 1919  AST 49*  ALT 17  ALKPHOS 82  BILITOT 1.4*  PROT 6.8  ALBUMIN 3.4*    Recent Labs  03/11/16 1919  03/12/16 0825 03/13/16 0455 03/14/16 0350 03/20/16 06/18/16  WBC 13.3*  --  11.6* 10.8* 11.3* 9.9 10.7  NEUTROABS 10.4*  --   --   --   --   --   --  HGB 15.3*  < > 13.9 13.8 14.4 14.1 15.4  HCT 44.9  < > 41.0 41.7 41.6 43 46  MCV 90.0  --  90.1 91.4 90.2  --   --   PLT 239  --  205 219 234 204 240  < > = values in this interval not displayed. Lab Results  Component Value Date   TSH 3.14 07/10/2016   Lab Results  Component Value Date   HGBA1C 5.3 03/13/2016   Lab Results  Component Value Date   CHOL 158 03/12/2016   HDL 38 (L) 03/12/2016   LDLCALC 107 (H) 03/12/2016   TRIG 66 03/12/2016   CHOLHDL 4.2 03/12/2016   Assessment/Plan  Hypothyroidism  Lab Results  Component Value Date   TSH 3.14 07/10/2016  Continue on Levothyroxine. Monitor TSH level.   HTN B/p stable. Continue on Amlodipine 5 mg tablet daily. Monitor BMP.   Constipation  Current bowel regimen effective. LBM 08/14/2016. Continue to encourage fluid and oral intake. Continue current stool softener.   Family/ staff Communication: Reviewed plan of care with Nurse supervisor.   Labs/tests ordered:  None

## 2016-08-27 ENCOUNTER — Encounter: Payer: Self-pay | Admitting: Family

## 2016-08-27 ENCOUNTER — Non-Acute Institutional Stay (SKILLED_NURSING_FACILITY): Payer: Medicare Other | Admitting: Family

## 2016-08-27 DIAGNOSIS — L03012 Cellulitis of left finger: Secondary | ICD-10-CM | POA: Diagnosis not present

## 2016-08-27 MED ORDER — CEPHALEXIN 500 MG PO CAPS: 500.0000 mg | ORAL_CAPSULE | Freq: Three times a day (TID) | ORAL | 0 refills | Status: AC

## 2016-08-27 NOTE — Progress Notes (Signed)
Location:  [facility] Room Number: 901 A Place of Service:  SNF (31) Provider:  Richarda Blade, NP  Oneal Grout, MD  Patient Care Team: Oneal Grout, MD as PCP - General (Internal Medicine) Caesar Bookman, NP as Nurse Practitioner (Geriatric Medicine)  Extended Emergency Contact Information Primary Emergency Contact: Neese,Bill Address: [address]          [place], [place] [address] D[place] of [place] Home Phone: [phone] Mobile Phone: [phone] Relation: Nephew Secondary Emergency Contact: Vanessa Barbara [place] of [place] Home Phone: [phone] Mobile Phone: [phone] Relation: Friend  Code Status:  DNR  Goals of care: Advanced Directive information Advanced Directives 08/27/2016  Does Patient Have a Medical Advance Directive? Yes  Type of Estate agent of [place];Out of facility DNR (pink MOST or yellow form)  Does patient want to make changes to medical advance directive? -  Copy of Healthcare Power of Attorney in Chart? Yes  Pre-existing out of facility DNR order (yellow form or pink MOST form) Yellow form placed in chart (order not valid for inpatient use)     Chief Complaint  Patient presents with  . Acute Visit    swollen  finger     HPI:  Pt is a 83 y.o. female seen today at [facility] for acute visit to evaluate left thumb swelling.She has a medical history of HTN,CAD,Stroke  hypothyroidism, COPD, GERD among other conditions.She is seen in her room today per facility Nurse request. Facility Nurse reports patient's left thumb swollen and pussy. No fever or chills reported but patient seems to be not her self today.She alert   but pleasant confused at her baseline unable to provider HPI and ROS.Observed eating lunch using right hand.  Past Medical History:  Diagnosis Date  . Chronic kidney disease   . Chronic pain   . COLONIC POLYPS, HX OF 01/01/2008   Qualifier: Diagnosis of  By: Andrey Campanile MD, Raliegh Ip    . Dementia   . DIVERTICULITIS, HX OF 01/01/2008   Qualifier: Diagnosis of  By: Andrey Campanile MD, Raliegh Ip    . GASTRIC ULCER 01/01/2008   Qualifier: Diagnosis of  By: Andrey Campanile MD, Raliegh Ip    . GERD (gastroesophageal reflux disease)   . HCAP (healthcare-associated pneumonia) 05/17/2013  . Hypertension   . Thyroid disease   . Vitamin D deficiency    History reviewed. No pertinent surgical history.  Allergies  Allergen Reactions  . Sulfonamide Derivatives Other (See Comments)    On MAR    Allergies as of 08/27/2016      Reactions   Sulfonamide Derivatives Other (See Comments)   On MAR      Medication List       Accurate as of 08/27/16  1:53 PM. Always use your most recent med list.          Acetaminophen 325 MG Caps Take 650 mg by mouth every 6 (six) hours as needed (pain).   amLODipine 5 MG tablet Commonly known as:  NORVASC Take 0.5 tablets (2.5 mg total) by mouth daily.   atorvastatin 20 MG tablet Commonly known as:  LIPITOR Take 1 tablet (20 mg total) by mouth daily at 6 PM.   calcium carbonate 600 MG Tabs tablet Commonly known as:  OS-CAL Take 600 mg by mouth 2 (two) times daily with a meal.   cephALEXin 500 MG capsule Commonly known as:  KEFLEX Take 1 capsule (500 mg total) by mouth 3 (three)  times daily.   clopidogrel 75 MG tablet Commonly known as:  PLAVIX Take 1 tablet (75 mg total) by mouth daily.   ketoconazole 2 % shampoo Commonly known as:  NIZORAL Apply 1 application topically 2 (two) times a week. On bath days   levothyroxine 25 MCG tablet Commonly known as:  SYNTHROID, LEVOTHROID Take 25 mcg by mouth daily before breakfast.   sennosides-docusate sodium 8.6-50 MG tablet Commonly known as:  SENOKOT-S Take 1 tablet by mouth 2 (two) times daily. \   UNABLE TO FIND Med Name: Med pass 90 mL by mouth 3 times daily   zinc oxide 11.3 % Crea cream Commonly known as:  BALMEX Apply 1 application  topically. Every shift       Review of Systems  Unable to perform ROS: Dementia    Immunization History  Administered Date(s) Administered  . Influenza-Unspecified 02/08/2014, 03/03/2015, 02/08/2016  . PPD Test 01/17/2014, 01/10/2015  . Pneumococcal-Unspecified 11/08/2009   Pertinent  Health Maintenance Due  Topic Date Due  . PNA vac Low Risk Adult (2 of 2 - PCV13) 05/06/2017 (Originally 11/09/2010)  . INFLUENZA VACCINE  12/04/2016  . DEXA SCAN  Completed     Vitals:   08/27/16 1317  BP: (!) 159/65  Pulse: (!) 54  Resp: 16  Temp: 98.2 F (36.8 C)  TempSrc: Oral  SpO2: 93%  Weight: 118 lb 9.6 oz (53.8 kg)  Height: 5' (1.524 m)   Body mass index is 23.16 kg/m. Physical Exam  Constitutional:  Pleasantly confused at her baseline.    HENT:  Head: Normocephalic.  Mouth/Throat: Oropharynx is clear and moist.  Eyes: Conjunctivae and EOM are normal. Pupils are equal, round, and reactive to light. Right eye exhibits no discharge. Left eye exhibits no discharge. No scleral icterus.  Neck: Normal range of motion. No JVD present. No thyromegaly present.  Cardiovascular: Normal rate, regular rhythm, normal heart sounds and intact distal pulses.  Exam reveals no gallop and no friction rub.   No murmur heard. Pulmonary/Chest: Effort normal and breath sounds normal. No respiratory distress. She has no wheezes. She has no rales.  Abdominal: Soft. Bowel sounds are normal. She exhibits no distension. There is no tenderness. There is no rebound and no guarding.  Genitourinary:  Genitourinary Comments: Incontinent for both bowel and bladder   Musculoskeletal: Normal range of motion. She exhibits no edema, tenderness or deformity.  Moves x 4 extremities  Lymphadenopathy:    She has no cervical adenopathy.  Neurological: She is alert.  Pleasantly confused at baseline.  Skin: Skin is warm and dry. No rash noted. No erythema. No pallor.  Left thumb/ cuticle swollen,red, tender and warm  to touch. Patient guarding hand.   Psychiatric: She has a normal mood and affect.    Labs reviewed:  Recent Labs  03/12/16 0640 03/12/16 1510 03/14/16 0350 03/20/16 06/18/16 07/10/16  NA 137 135 135 138 141 140  K 4.2 4.3 3.6 4.7 4.5 4.2  CL 104 103 100*  --   --   --   CO2 --   --   --   GLUCOSE 96 126* 94  --   --   --   BUN 24* 24* 19 23* 25*  --   CREATININE 0.87 0.98 0.89 0.9 0.9 0.9  CALCIUM 9.0 9.0 9.1  --   --   --     Recent Labs  03/11/16 1919  AST 49*  ALT 17  ALKPHOS 82  BILITOT  1.4*  PROT 6.8  ALBUMIN 3.4*    Recent Labs  03/11/16 1919  03/12/16 0825 03/13/16 0455 03/14/16 0350 03/20/16 06/18/16  WBC 13.3*  --  11.6* 10.8* 11.3* 9.9 10.7  NEUTROABS 10.4*  --   --   --   --   --   --   HGB 15.3*  < > 13.9 13.8 14.4 14.1 15.4  HCT 44.9  < > 41.0 41.7 41.6 43 46  MCV 90.0  --  90.1 91.4 90.2  --   --   PLT 239  --  205 219 234 204 240  < > = values in this interval not displayed. Lab Results  Component Value Date   TSH 3.14 07/10/2016   Lab Results  Component Value Date   HGBA1C 5.3 03/13/2016   Lab Results  Component Value Date   CHOL 158 03/12/2016   HDL 38 (L) 03/12/2016   LDLCALC 107 (H) 03/12/2016   TRIG 66 03/12/2016   CHOLHDL 4.2 03/12/2016   Assessment/Plan Left Thumb cellulitis  Afebrile.Left thumb/ cuticle swollen,red, tender and warm to touch. Patient guarding hand. Start Tylenol 500 mg tablet one every 8 Hours X 10 days for pain then resume prior orders.Keflex 500 mg Capsule on the by mouth three times daily X 7 days First dose now. Add florastor 250 mg Capsule twice daily x 10 days for prophylaxis. Continue to monitor.   Family/ staff Communication: Reviewed plan of care with Nurse supervisor.   Labs/tests ordered:  None

## 2016-09-05 DIAGNOSIS — M79674 Pain in right toe(s): Secondary | ICD-10-CM | POA: Diagnosis not present

## 2016-09-05 DIAGNOSIS — I70203 Unspecified atherosclerosis of native arteries of extremities, bilateral legs: Secondary | ICD-10-CM | POA: Diagnosis not present

## 2016-09-05 DIAGNOSIS — I739 Peripheral vascular disease, unspecified: Secondary | ICD-10-CM | POA: Diagnosis not present

## 2016-09-05 DIAGNOSIS — B351 Tinea unguium: Secondary | ICD-10-CM | POA: Diagnosis not present

## 2016-09-10 ENCOUNTER — Non-Acute Institutional Stay (SKILLED_NURSING_FACILITY): Payer: Medicare Other | Admitting: Family

## 2016-09-10 ENCOUNTER — Encounter: Payer: Self-pay | Admitting: Family

## 2016-09-10 DIAGNOSIS — E782 Mixed hyperlipidemia: Secondary | ICD-10-CM | POA: Diagnosis not present

## 2016-09-10 DIAGNOSIS — K5901 Slow transit constipation: Secondary | ICD-10-CM

## 2016-09-10 DIAGNOSIS — E039 Hypothyroidism, unspecified: Secondary | ICD-10-CM | POA: Diagnosis not present

## 2016-09-10 NOTE — Progress Notes (Signed)
Location:  [facility] Room Number: 901A Place of Service:  SNF [phone]) Provider:  Richarda Blade, NP  Oneal Grout, MD  Patient Care Team: Oneal Grout, MD as PCP - General (Internal Medicine) Nayelie Gionfriddo, Donalee Citrin, NP as Nurse Practitioner (Geriatric Medicine)  Extended Emergency Contact Information Primary Emergency Contact: Neese,Bill Address: [address]          [place], [place] [address] [place] of [place] Home Phone: [phone] Mobile Phone: [phone] Relation: Nephew Secondary Emergency Contact: Vanessa Barbara [place] of [place] Home Phone: [phone] Mobile Phone: [phone] Relation: Friend  Code Status:  DNF Goals of care: Advanced Directive information Advanced Directives 09/10/2016  Does Patient Have a Medical Advance Directive? Yes  Type of Advance Directive Out of facility DNR (pink MOST or yellow form)  Does patient want to make changes to medical advance directive? -  Copy of Healthcare Power of Attorney in Chart? Yes  Pre-existing out of facility DNR order (yellow form or pink MOST form) Yellow form placed in chart (order not valid for inpatient use)     Chief Complaint  Patient presents with  . Medical Management of Chronic Issues    Routine Visit    HPI:  Pt is a 83 y.o. female seen today at [facility] for medical management of chronic diseases. She has a medical history of HTN,CAD,Stroke  hypothyroidism, COPD, GERD among other conditions.She is seen in her room today. Recent right thumb cellulitis resolved. No weight changes or fall episodes.Facility Nurse reports no acute issues this visit.   Past Medical History:  Diagnosis Date  . Chronic kidney disease   . Chronic pain   . COLONIC POLYPS, HX OF 01/01/2008   Qualifier: Diagnosis of  By: Andrey Campanile MD, Raliegh Ip    . Dementia   . DIVERTICULITIS, HX OF 01/01/2008   Qualifier: Diagnosis of  By: Andrey Campanile MD, Raliegh Ip    . GASTRIC ULCER  01/01/2008   Qualifier: Diagnosis of  By: Andrey Campanile MD, Raliegh Ip    . GERD (gastroesophageal reflux disease)   . HCAP (healthcare-associated pneumonia) 05/17/2013  . Hypertension   . Thyroid disease   . Vitamin D deficiency    No past surgical history on file.  Allergies  Allergen Reactions  . Sulfonamide Derivatives Other (See Comments)    On MAR    Allergies as of 09/10/2016      Reactions   Sulfonamide Derivatives Other (See Comments)   On MAR      Medication List       Accurate as of 09/10/16  3:19 PM. Always use your most recent med list.          Acetaminophen 325 MG Caps Take 650 mg by mouth every 6 (six) hours as needed (pain).   amLODipine 5 MG tablet Commonly known as:  NORVASC Take 0.5 tablets (2.5 mg total) by mouth daily.   atorvastatin 20 MG tablet Commonly known as:  LIPITOR Take 1 tablet (20 mg total) by mouth daily at 6 PM.   calcium carbonate 600 MG Tabs tablet Commonly known as:  OS-CAL Take 600 mg by mouth 2 (two) times daily with a meal.   clopidogrel 75 MG tablet Commonly known as:  PLAVIX Take 1 tablet (75 mg total) by mouth daily.   ketoconazole 2 % shampoo Commonly known as:  NIZORAL Apply 1 application topically 2 (two) times a week. On bath days   levothyroxine 25 MCG tablet Commonly known as:  SYNTHROID, LEVOTHROID Take 25 mcg by mouth daily before breakfast.   sennosides-docusate sodium 8.6-50 MG tablet Commonly known as:  SENOKOT-S Take 1 tablet by mouth daily. \   UNABLE TO FIND Med Name: Med pass 90 mL by mouth 3 times daily   zinc oxide 11.3 % Crea cream Commonly known as:  BALMEX Apply 1 application topically. Every shift       Review of Systems  Unable to perform ROS: Dementia    Immunization History  Administered Date(s) Administered  . Influenza-Unspecified 02/08/2014, 03/03/2015, 02/08/2016  . PPD Test 01/17/2014, 01/10/2015  . Pneumococcal-Unspecified 11/08/2009   Pertinent  Health Maintenance Due  Topic  Date Due  . PNA vac Low Risk Adult (2 of 2 - PCV13) 05/06/2017 (Originally 11/09/2010)  . INFLUENZA VACCINE  12/04/2016  . DEXA SCAN  Completed     Vitals:   09/10/16 1002  BP: (!) 147/46  Pulse: (!) 50  Resp: 16  Temp: 97.1 F (36.2 C)  SpO2: 95%  Weight: 118 lb 9.6 oz (53.8 kg)  Height: 5' (1.524 m)   Body mass index is 23.16 kg/m. Physical Exam  Constitutional:  Elderly pleasantly confused at her baseline.    HENT:  Head: Normocephalic.  Mouth/Throat: Oropharynx is clear and moist.  Eyes: Conjunctivae and EOM are normal. Pupils are equal, round, and reactive to light. Right eye exhibits no discharge. Left eye exhibits no discharge. No scleral icterus.  Neck: Normal range of motion. No JVD present. No thyromegaly present.  Cardiovascular: Normal rate, regular rhythm, normal heart sounds and intact distal pulses.  Exam reveals no gallop and no friction rub.   No murmur heard. Pulmonary/Chest: Effort normal and breath sounds normal. No respiratory distress. She has no wheezes. She has no rales.  Abdominal: Soft. Bowel sounds are normal. She exhibits no distension. There is no tenderness. There is no rebound and no guarding.  Genitourinary:  Genitourinary Comments: Incontinent  Musculoskeletal: Normal range of motion. She exhibits no edema, tenderness or deformity.  Moves x 4 extremities without any difficulties   Lymphadenopathy:    She has no cervical adenopathy.  Neurological: She is alert.  Pleasantly confused at her baseline.  Skin: Skin is warm and dry. No rash noted. No erythema. No pallor.  Psychiatric: She has a normal mood and affect.    Labs reviewed:  Recent Labs  03/12/16 0640 03/12/16 1510 03/14/16 0350 03/20/16 06/18/16 07/10/16  NA 137 135 135 138 141 140  K 4.2 4.3 3.6 4.7 4.5 4.2  CL 104 103 100*  --   --   --   CO2 23 22 26   --   --   --   GLUCOSE 96 126* 94  --   --   --   BUN 24* 24* 19 23* 25*  --   CREATININE 0.87 0.98 0.89 0.9 0.9 0.9    CALCIUM 9.0 9.0 9.1  --   --   --     Recent Labs  03/11/16 1919  AST 49*  ALT 17  ALKPHOS 82  BILITOT 1.4*  PROT 6.8  ALBUMIN 3.4*    Recent Labs  03/11/16 1919  03/12/16 0825 03/13/16 0455 03/14/16 0350 03/20/16 06/18/16  WBC 13.3*  --  11.6* 10.8* 11.3* 9.9 10.7  NEUTROABS 10.4*  --   --   --   --   --   --   HGB 15.3*  < > 13.9 13.8 14.4 14.1 15.4  HCT 44.9  < > 41.0  41.7 41.6 43 46  MCV 90.0  --  90.1 91.4 90.2  --   --   PLT 239  --  205 219 234 204 240  < > = values in this interval not displayed. Lab Results  Component Value Date   TSH 3.14 07/10/2016   Lab Results  Component Value Date   HGBA1C 5.3 03/13/2016   Lab Results  Component Value Date   CHOL 158 03/12/2016   HDL 38 (L) 03/12/2016   LDLCALC 107 (H) 03/12/2016   TRIG 66 03/12/2016   CHOLHDL 4.2 03/12/2016   Assessment/Plan  Constipation  Current bowel regimen effective.Has good appetite.Continue current stool softener.continue to monitor.   Hypothyroidism  Lab Results  Component Value Date   TSH 3.14 07/10/2016  Continue on Levothyroxine. Monitor TSH level.   Hyperlipidemia  Continue on atorvastatin 20 mg Tablet daily. Check lipid panel 09/16/2016     Family/ staff Communication: Reviewed plan of care with Nurse supervisor.   Labs/tests ordered:  lipid panel 09/16/2016

## 2016-09-16 DIAGNOSIS — E038 Other specified hypothyroidism: Secondary | ICD-10-CM | POA: Diagnosis not present

## 2016-10-14 ENCOUNTER — Ambulatory Visit (INDEPENDENT_AMBULATORY_CARE_PROVIDER_SITE_OTHER): Payer: Medicare Other | Admitting: Nurse Practitioner

## 2016-10-14 ENCOUNTER — Encounter: Payer: Self-pay | Admitting: Nurse Practitioner

## 2016-10-14 VITALS — BP 150/80 | HR 56

## 2016-10-14 DIAGNOSIS — I639 Cerebral infarction, unspecified: Secondary | ICD-10-CM

## 2016-10-14 DIAGNOSIS — I6523 Occlusion and stenosis of bilateral carotid arteries: Secondary | ICD-10-CM | POA: Diagnosis not present

## 2016-10-14 DIAGNOSIS — I10 Essential (primary) hypertension: Secondary | ICD-10-CM

## 2016-10-14 NOTE — Patient Instructions (Signed)
See skilled sheet 

## 2016-10-14 NOTE — Progress Notes (Signed)
[facility]  PATIENT: Shannon Gomez DOB: 03/06/1914   REASON FOR VISIT: follow-up for stroke HISTORY FROM: patient alone at visit    HISTORY OF PRESENT ILLNESS:UPDATE 06/11/2018CM Shannon Gomez, 83 year old female returns for follow-up for history of stroke event in November 2017. She also has a history of dementia and currently resides at [facility] for several years. She remains on Plavix secondary stroke prevention without recurrent stroke or TIA symptoms according to information from [facility]. She remains on Lipitor. She is seated in a wheelchair today she answers some questions appropriately. She returns for reevaluation   HISTORY 05/08/16 CMElizabeth N Robertsis an 83 y.o.femalewho presents via EMS from [facility] with new onset slurred speech, right facial droop and RUE weakness. Initially noted at 1600 on 03/11/16 but time last normal was unknown. She has a history of dementia.  MRI performed here revealed the following: Small volume acute ischemic left MCA cortical infarct involving the left insula and frontal operculum, seen in association with an abnormal flow void within the left ICA, suspected to be occluded. There is a 2 cm densely calcified meningioma overlying the left temporal lobe without associated edema. Prominent generalized age-related cerebral atrophy with mild chronic small vessel ischemic disease is also noted. Patient was not administered IV t-PA secondary to unknown LKW. She was admitted for further evaluation and treatment. Carotid ultrasound findings are consistent with 60-79% stenosis involving the right internal carotid artery 80-99% stenosis involving the left internal carotid artery. Lower extremity venous Doppler without evidence of deep or superficial facial vein thrombosis involving the lower extremities. 2-D echo with EF 65-70% LDL 107 and Lipitor added. No antithrombotic or to admission, now on aspirin 325  and Plavix.. On return visit to the stroke clinic today with her nephew she cannot provide a history. She denies that she has been in the hospital. She has lived [facility] for several years. She has not had further stroke or TIA symptoms according to the information received from [facility]. The nephew has little knowledge of her hospitalization , or that she had a stroke however he has her power of attorney       REVIEW OF SYSTEMS: Full 14 system review of systems performed and notable only for those listed, all others are neg:  Constitutional: neg  Cardiovascular: neg Ear/Nose/Throat: neg  Skin: neg Eyes: neg Respiratory: neg Gastroitestinal: neg  Hematology/Lymphatic: neg  Endocrine: neg Musculoskeletal: Gait abnormality Allergy/Immunology: neg Neurological: Memory loss, stroke  Psychiatric: neg Sleep : neg   ALLERGIES: Allergies  Allergen Reactions  . Sulfonamide Derivatives Other (See Comments)    On MAR    HOME MEDICATIONS: Outpatient Medications Prior to Visit  Medication Sig Dispense Refill  . Acetaminophen 325 MG CAPS Take 650 mg by mouth every 6 (six) hours as needed (pain).     Marland Kitchen. amLODipine (NORVASC) 5 MG tablet Take 0.5 tablets (2.5 mg total) by mouth daily. 10 tablet 0  . atorvastatin (LIPITOR) 20 MG tablet Take 1 tablet (20 mg total) by mouth daily at 6 PM. 30 tablet 0  . calcium carbonate (OS-CAL) 600 MG TABS tablet Take 600 mg by mouth 2 (two) times daily with a meal.    . clopidogrel (PLAVIX) 75 MG tablet Take 1 tablet (75 mg total) by mouth daily. 60 tablet 0  . ketoconazole (NIZORAL) 2 % shampoo Apply 1 application topically 2 (two) times a week. On bath days    . levothyroxine (SYNTHROID, LEVOTHROID) 25  MCG tablet Take 25 mcg by mouth daily before breakfast.     . sennosides-docusate sodium (SENOKOT-S) 8.6-50 MG tablet Take 1 tablet by mouth daily. \    . UNABLE TO FIND Med Name: Med pass 90 mL by mouth 3 times daily    . zinc oxide (BALMEX)  11.3 % CREA cream Apply 1 application topically. Every shift     No facility-administered medications prior to visit.     PAST MEDICAL HISTORY: Past Medical History:  Diagnosis Date  . Chronic kidney disease   . Chronic pain   . COLONIC POLYPS, HX OF 01/01/2008   Qualifier: Diagnosis of  By: Andrey Campanile MD, Raliegh Ip    . Dementia   . DIVERTICULITIS, HX OF 01/01/2008   Qualifier: Diagnosis of  By: Andrey Campanile MD, Raliegh Ip    . GASTRIC ULCER 01/01/2008   Qualifier: Diagnosis of  By: Andrey Campanile MD, Raliegh Ip    . GERD (gastroesophageal reflux disease)   . HCAP (healthcare-associated pneumonia) 05/17/2013  . Hypertension   . Thyroid disease   . Vitamin D deficiency     PAST SURGICAL HISTORY: No past surgical history on file.  FAMILY HISTORY: No family history on file.  SOCIAL HISTORY: Social History   Social History  . Marital status: Divorced    Spouse name: N/A  . Number of children: N/A  . Years of education: N/A   Occupational History  . Not on file.   Social History Main Topics  . Smoking status: Never Smoker  . Smokeless tobacco: Never Used  . Alcohol use Not on file  . Drug use: Unknown  . Sexual activity: No   Other Topics Concern  . Not on file   Social History Narrative   Pt [facility] at [facility].       PHYSICAL EXAM  Vitals:   10/14/16 1431  BP: (!) 150/80   Pulse: (!) 56   There is no height or weight on file to calculate BMI.  Generalized: Well developed, in no acute distress  Head: normocephalic and atraumatic,. Oropharynx benign  Neck: Supple, no carotid bruits  Cardiac: Regular rate rhythm,  Musculoskeletal: No deformity   Neurological examination   Mentation: Alert , Oriented to place or time expressive  Aphasia, dysarthria has problems following commands.   Cranial nerve II-XII: Unable to cooperate for funduscopic .Pupils were equal round reactive to light extraocular movements were full, visual field were full on confrontational test.  Mild right facial droop , hearing was intact to finger rubbing bilaterally. Uvula tongue midline. head turning and shoulder shrug were normal and symmetric.. Motor: Moves all extremities symmetrically Sensory: Withdraws to pain exam otherwise unreliable Coordination: Exam unreliable Reflexes: 1+ upper lower and symmetric plantar responses were flexor bilaterally. Gait and Station: In wheelchair not ambulated  DIAGNOSTIC DATA (LABS, IMAGING, TESTING) - I reviewed patient records, labs, notes, testing and imaging myself where available.  Lab Results  Component Value Date   WBC 10.7 06/18/2016   HGB 15.4 06/18/2016   HCT 46 06/18/2016   MCV 90.2 03/14/2016   PLT 240 06/18/2016      Component Value Date/Time   NA 140 07/10/2016   K 4.2 07/10/2016   CL 100 (L) 03/14/2016 0350   CO2 26 03/14/2016 0350   GLUCOSE 94 03/14/2016 0350   BUN 25 (A) 06/18/2016   CREATININE 0.9 07/10/2016   CREATININE 0.89 03/14/2016 0350   CALCIUM 9.1 03/14/2016 0350   PROT 6.8 03/11/2016 1919   ALBUMIN 3.4 (L)  03/11/2016 1919   AST 49 (H) 03/11/2016 1919   ALT 17 03/11/2016 1919   ALKPHOS 82 03/11/2016 1919   BILITOT 1.4 (H) 03/11/2016 1919   GFRNONAA 51 (L) 03/14/2016 0350   GFRAA 60 (L) 03/14/2016 0350   Lab Results  Component Value Date   CHOL 158 03/12/2016   HDL 38 (L) 03/12/2016   LDLCALC 107 (H) 03/12/2016   TRIG 66 03/12/2016   CHOLHDL 4.2 03/12/2016   Lab Results  Component Value Date   HGBA1C 5.3 03/13/2016   No results found for: [id number] Lab Results  Component Value Date   TSH 3.14 07/10/2016      ASSESSMENT AND PLAN  83 y.o. year old female  has a past medical history of Chronic kidney disease; Chronic pain; COLONIC POLYPS, HX OF (01/01/2008); Dementia;  Hypertension; Thyroid disease; and Vitamin D deficiency. Here for stroke to follow-up .Acute ischemic left MCA cortical infarct involving the left insula and frontal operculum, seen in association with an abnormal flow  void within the left ICA, suspected to be occluded in November 2017.The patient is a current patient of Dr. Roda Shutters  who is out of the office today . This note is sent to the work in doctor.     PLAN.Stressed the importance of management of risk factors to prevent further stroke  Continue Plavix for secondary stroke prevention Maintain strict control of hypertension with blood pressure goal below 130/90, today's reading 150/80 continue antihypertensive medications Cholesterol with LDL cholesterol less than 70, followed by primary care,  continue Lipitor eat healthy diet with whole grains,  fresh fruits and vegetables Discharge from the stroke clinic Nilda Riggs, [facility], [facility], APRN  [facility] [address] [place], [place] [address] [phone]

## 2016-11-16 NOTE — Progress Notes (Signed)
Personally  participated in, made any corrections needed, and agree with history, physical, neuro exam,assessment and plan as stated.     Antonia Ahern, MD [facility]     

## 2016-12-06 DIAGNOSIS — R471 Dysarthria and anarthria: Secondary | ICD-10-CM | POA: Diagnosis not present

## 2016-12-06 DIAGNOSIS — Z7409 Other reduced mobility: Secondary | ICD-10-CM | POA: Diagnosis not present

## 2016-12-06 DIAGNOSIS — N189 Chronic kidney disease, unspecified: Secondary | ICD-10-CM | POA: Diagnosis not present

## 2016-12-06 DIAGNOSIS — J449 Chronic obstructive pulmonary disease, unspecified: Secondary | ICD-10-CM | POA: Diagnosis not present

## 2017-01-10 DIAGNOSIS — I158 Other secondary hypertension: Secondary | ICD-10-CM | POA: Diagnosis not present

## 2017-01-10 DIAGNOSIS — E58 Dietary calcium deficiency: Secondary | ICD-10-CM | POA: Diagnosis not present

## 2017-01-14 DIAGNOSIS — E039 Hypothyroidism, unspecified: Secondary | ICD-10-CM | POA: Diagnosis not present

## 2017-01-24 DIAGNOSIS — J449 Chronic obstructive pulmonary disease, unspecified: Secondary | ICD-10-CM | POA: Diagnosis not present

## 2017-01-24 DIAGNOSIS — Z7409 Other reduced mobility: Secondary | ICD-10-CM | POA: Diagnosis not present

## 2017-01-24 DIAGNOSIS — R32 Unspecified urinary incontinence: Secondary | ICD-10-CM | POA: Diagnosis not present

## 2017-01-24 DIAGNOSIS — F0390 Unspecified dementia without behavioral disturbance: Secondary | ICD-10-CM | POA: Diagnosis not present

## 2017-02-25 DIAGNOSIS — I739 Peripheral vascular disease, unspecified: Secondary | ICD-10-CM | POA: Diagnosis not present

## 2017-02-25 DIAGNOSIS — B351 Tinea unguium: Secondary | ICD-10-CM | POA: Diagnosis not present

## 2017-04-22 DIAGNOSIS — R1312 Dysphagia, oropharyngeal phase: Secondary | ICD-10-CM | POA: Diagnosis not present

## 2017-05-14 DIAGNOSIS — E039 Hypothyroidism, unspecified: Secondary | ICD-10-CM | POA: Diagnosis not present

## 2017-05-14 DIAGNOSIS — Z7409 Other reduced mobility: Secondary | ICD-10-CM | POA: Diagnosis not present

## 2017-05-14 DIAGNOSIS — F0390 Unspecified dementia without behavioral disturbance: Secondary | ICD-10-CM | POA: Diagnosis not present

## 2017-05-14 DIAGNOSIS — I10 Essential (primary) hypertension: Secondary | ICD-10-CM | POA: Diagnosis not present

## 2017-06-09 DIAGNOSIS — R001 Bradycardia, unspecified: Secondary | ICD-10-CM | POA: Diagnosis not present

## 2017-06-24 DIAGNOSIS — R293 Abnormal posture: Secondary | ICD-10-CM | POA: Diagnosis not present

## 2017-06-25 DIAGNOSIS — R293 Abnormal posture: Secondary | ICD-10-CM | POA: Diagnosis not present

## 2017-06-26 DIAGNOSIS — R293 Abnormal posture: Secondary | ICD-10-CM | POA: Diagnosis not present

## 2017-06-27 DIAGNOSIS — R293 Abnormal posture: Secondary | ICD-10-CM | POA: Diagnosis not present

## 2017-06-30 DIAGNOSIS — R293 Abnormal posture: Secondary | ICD-10-CM | POA: Diagnosis not present

## 2017-07-01 DIAGNOSIS — R293 Abnormal posture: Secondary | ICD-10-CM | POA: Diagnosis not present

## 2017-07-02 DIAGNOSIS — R293 Abnormal posture: Secondary | ICD-10-CM | POA: Diagnosis not present

## 2017-07-03 DIAGNOSIS — R293 Abnormal posture: Secondary | ICD-10-CM | POA: Diagnosis not present

## 2017-07-04 DIAGNOSIS — R293 Abnormal posture: Secondary | ICD-10-CM | POA: Diagnosis not present

## 2017-07-04 DIAGNOSIS — R1312 Dysphagia, oropharyngeal phase: Secondary | ICD-10-CM | POA: Diagnosis not present

## 2017-07-04 DIAGNOSIS — M6281 Muscle weakness (generalized): Secondary | ICD-10-CM | POA: Diagnosis not present

## 2017-07-07 DIAGNOSIS — M6281 Muscle weakness (generalized): Secondary | ICD-10-CM | POA: Diagnosis not present

## 2017-07-07 DIAGNOSIS — R1312 Dysphagia, oropharyngeal phase: Secondary | ICD-10-CM | POA: Diagnosis not present

## 2017-07-07 DIAGNOSIS — R293 Abnormal posture: Secondary | ICD-10-CM | POA: Diagnosis not present

## 2017-07-08 DIAGNOSIS — M6281 Muscle weakness (generalized): Secondary | ICD-10-CM | POA: Diagnosis not present

## 2017-07-08 DIAGNOSIS — R1312 Dysphagia, oropharyngeal phase: Secondary | ICD-10-CM | POA: Diagnosis not present

## 2017-07-08 DIAGNOSIS — R293 Abnormal posture: Secondary | ICD-10-CM | POA: Diagnosis not present

## 2017-07-09 DIAGNOSIS — E039 Hypothyroidism, unspecified: Secondary | ICD-10-CM | POA: Diagnosis not present

## 2017-07-09 DIAGNOSIS — Z7409 Other reduced mobility: Secondary | ICD-10-CM | POA: Diagnosis not present

## 2017-07-09 DIAGNOSIS — R1312 Dysphagia, oropharyngeal phase: Secondary | ICD-10-CM | POA: Diagnosis not present

## 2017-07-09 DIAGNOSIS — M6281 Muscle weakness (generalized): Secondary | ICD-10-CM | POA: Diagnosis not present

## 2017-07-09 DIAGNOSIS — I10 Essential (primary) hypertension: Secondary | ICD-10-CM | POA: Diagnosis not present

## 2017-07-09 DIAGNOSIS — F0390 Unspecified dementia without behavioral disturbance: Secondary | ICD-10-CM | POA: Diagnosis not present

## 2017-07-09 DIAGNOSIS — R293 Abnormal posture: Secondary | ICD-10-CM | POA: Diagnosis not present

## 2017-07-10 DIAGNOSIS — R293 Abnormal posture: Secondary | ICD-10-CM | POA: Diagnosis not present

## 2017-07-10 DIAGNOSIS — M6281 Muscle weakness (generalized): Secondary | ICD-10-CM | POA: Diagnosis not present

## 2017-07-10 DIAGNOSIS — E58 Dietary calcium deficiency: Secondary | ICD-10-CM | POA: Diagnosis not present

## 2017-07-10 DIAGNOSIS — E039 Hypothyroidism, unspecified: Secondary | ICD-10-CM | POA: Diagnosis not present

## 2017-07-10 DIAGNOSIS — R1312 Dysphagia, oropharyngeal phase: Secondary | ICD-10-CM | POA: Diagnosis not present

## 2017-07-10 DIAGNOSIS — I158 Other secondary hypertension: Secondary | ICD-10-CM | POA: Diagnosis not present

## 2017-07-11 DIAGNOSIS — R293 Abnormal posture: Secondary | ICD-10-CM | POA: Diagnosis not present

## 2017-07-11 DIAGNOSIS — R1312 Dysphagia, oropharyngeal phase: Secondary | ICD-10-CM | POA: Diagnosis not present

## 2017-07-11 DIAGNOSIS — M6281 Muscle weakness (generalized): Secondary | ICD-10-CM | POA: Diagnosis not present

## 2017-07-14 DIAGNOSIS — M6281 Muscle weakness (generalized): Secondary | ICD-10-CM | POA: Diagnosis not present

## 2017-07-14 DIAGNOSIS — R1312 Dysphagia, oropharyngeal phase: Secondary | ICD-10-CM | POA: Diagnosis not present

## 2017-07-14 DIAGNOSIS — R293 Abnormal posture: Secondary | ICD-10-CM | POA: Diagnosis not present

## 2017-07-15 DIAGNOSIS — M6281 Muscle weakness (generalized): Secondary | ICD-10-CM | POA: Diagnosis not present

## 2017-07-15 DIAGNOSIS — R1312 Dysphagia, oropharyngeal phase: Secondary | ICD-10-CM | POA: Diagnosis not present

## 2017-07-15 DIAGNOSIS — R293 Abnormal posture: Secondary | ICD-10-CM | POA: Diagnosis not present

## 2017-07-17 DIAGNOSIS — K529 Noninfective gastroenteritis and colitis, unspecified: Secondary | ICD-10-CM | POA: Diagnosis not present

## 2017-07-18 DIAGNOSIS — R293 Abnormal posture: Secondary | ICD-10-CM | POA: Diagnosis not present

## 2017-07-18 DIAGNOSIS — M6281 Muscle weakness (generalized): Secondary | ICD-10-CM | POA: Diagnosis not present

## 2017-07-18 DIAGNOSIS — R1312 Dysphagia, oropharyngeal phase: Secondary | ICD-10-CM | POA: Diagnosis not present

## 2017-08-05 DIAGNOSIS — B351 Tinea unguium: Secondary | ICD-10-CM | POA: Diagnosis not present

## 2017-08-05 DIAGNOSIS — M2041 Other hammer toe(s) (acquired), right foot: Secondary | ICD-10-CM | POA: Diagnosis not present

## 2017-08-05 DIAGNOSIS — I739 Peripheral vascular disease, unspecified: Secondary | ICD-10-CM | POA: Diagnosis not present

## 2017-08-05 DIAGNOSIS — M2042 Other hammer toe(s) (acquired), left foot: Secondary | ICD-10-CM | POA: Diagnosis not present

## 2017-09-04 DIAGNOSIS — J449 Chronic obstructive pulmonary disease, unspecified: Secondary | ICD-10-CM | POA: Diagnosis not present

## 2017-09-04 DIAGNOSIS — I10 Essential (primary) hypertension: Secondary | ICD-10-CM | POA: Diagnosis not present

## 2017-09-04 DIAGNOSIS — F0390 Unspecified dementia without behavioral disturbance: Secondary | ICD-10-CM | POA: Diagnosis not present

## 2017-09-04 DIAGNOSIS — E039 Hypothyroidism, unspecified: Secondary | ICD-10-CM | POA: Diagnosis not present

## 2017-09-30 DIAGNOSIS — B373 Candidiasis of vulva and vagina: Secondary | ICD-10-CM | POA: Diagnosis not present

## 2017-09-30 DIAGNOSIS — N898 Other specified noninflammatory disorders of vagina: Secondary | ICD-10-CM | POA: Diagnosis not present

## 2017-10-02 DIAGNOSIS — N390 Urinary tract infection, site not specified: Secondary | ICD-10-CM | POA: Diagnosis not present

## 2017-10-13 DIAGNOSIS — R1312 Dysphagia, oropharyngeal phase: Secondary | ICD-10-CM | POA: Diagnosis not present

## 2017-10-14 DIAGNOSIS — R1312 Dysphagia, oropharyngeal phase: Secondary | ICD-10-CM | POA: Diagnosis not present

## 2017-10-15 DIAGNOSIS — R1312 Dysphagia, oropharyngeal phase: Secondary | ICD-10-CM | POA: Diagnosis not present

## 2017-10-16 DIAGNOSIS — R1312 Dysphagia, oropharyngeal phase: Secondary | ICD-10-CM | POA: Diagnosis not present

## 2017-10-17 DIAGNOSIS — R1312 Dysphagia, oropharyngeal phase: Secondary | ICD-10-CM | POA: Diagnosis not present

## 2017-10-20 DIAGNOSIS — R1312 Dysphagia, oropharyngeal phase: Secondary | ICD-10-CM | POA: Diagnosis not present

## 2017-10-21 DIAGNOSIS — R1312 Dysphagia, oropharyngeal phase: Secondary | ICD-10-CM | POA: Diagnosis not present

## 2017-10-22 DIAGNOSIS — R1312 Dysphagia, oropharyngeal phase: Secondary | ICD-10-CM | POA: Diagnosis not present

## 2017-10-23 DIAGNOSIS — R1312 Dysphagia, oropharyngeal phase: Secondary | ICD-10-CM | POA: Diagnosis not present

## 2017-10-24 DIAGNOSIS — R1312 Dysphagia, oropharyngeal phase: Secondary | ICD-10-CM | POA: Diagnosis not present

## 2017-10-27 DIAGNOSIS — R1312 Dysphagia, oropharyngeal phase: Secondary | ICD-10-CM | POA: Diagnosis not present

## 2017-10-28 DIAGNOSIS — R1312 Dysphagia, oropharyngeal phase: Secondary | ICD-10-CM | POA: Diagnosis not present

## 2017-10-29 DIAGNOSIS — R1312 Dysphagia, oropharyngeal phase: Secondary | ICD-10-CM | POA: Diagnosis not present

## 2017-10-30 DIAGNOSIS — R1312 Dysphagia, oropharyngeal phase: Secondary | ICD-10-CM | POA: Diagnosis not present

## 2017-10-31 DIAGNOSIS — R1312 Dysphagia, oropharyngeal phase: Secondary | ICD-10-CM | POA: Diagnosis not present

## 2017-11-03 DIAGNOSIS — R1312 Dysphagia, oropharyngeal phase: Secondary | ICD-10-CM | POA: Diagnosis not present

## 2017-11-04 DIAGNOSIS — R1312 Dysphagia, oropharyngeal phase: Secondary | ICD-10-CM | POA: Diagnosis not present

## 2017-11-05 DIAGNOSIS — R1312 Dysphagia, oropharyngeal phase: Secondary | ICD-10-CM | POA: Diagnosis not present

## 2017-11-06 DIAGNOSIS — R1312 Dysphagia, oropharyngeal phase: Secondary | ICD-10-CM | POA: Diagnosis not present

## 2017-11-10 DIAGNOSIS — B372 Candidiasis of skin and nail: Secondary | ICD-10-CM | POA: Diagnosis not present

## 2017-12-16 DIAGNOSIS — R21 Rash and other nonspecific skin eruption: Secondary | ICD-10-CM | POA: Diagnosis not present

## 2017-12-16 DIAGNOSIS — R001 Bradycardia, unspecified: Secondary | ICD-10-CM | POA: Diagnosis not present

## 2017-12-16 DIAGNOSIS — I10 Essential (primary) hypertension: Secondary | ICD-10-CM | POA: Diagnosis not present

## 2017-12-17 ENCOUNTER — Encounter: Payer: Self-pay | Admitting: Internal Medicine

## 2017-12-31 DIAGNOSIS — B372 Candidiasis of skin and nail: Secondary | ICD-10-CM | POA: Diagnosis not present

## 2017-12-31 DIAGNOSIS — L304 Erythema intertrigo: Secondary | ICD-10-CM | POA: Diagnosis not present

## 2018-01-01 DIAGNOSIS — H43813 Vitreous degeneration, bilateral: Secondary | ICD-10-CM | POA: Diagnosis not present

## 2018-01-01 DIAGNOSIS — Z961 Presence of intraocular lens: Secondary | ICD-10-CM | POA: Diagnosis not present

## 2018-01-01 DIAGNOSIS — H353114 Nonexudative age-related macular degeneration, right eye, advanced atrophic with subfoveal involvement: Secondary | ICD-10-CM | POA: Diagnosis not present

## 2018-01-01 DIAGNOSIS — H353123 Nonexudative age-related macular degeneration, left eye, advanced atrophic without subfoveal involvement: Secondary | ICD-10-CM | POA: Diagnosis not present

## 2018-01-12 DIAGNOSIS — E58 Dietary calcium deficiency: Secondary | ICD-10-CM | POA: Diagnosis not present

## 2018-01-12 DIAGNOSIS — I158 Other secondary hypertension: Secondary | ICD-10-CM | POA: Diagnosis not present

## 2018-01-12 DIAGNOSIS — Z0000 Encounter for general adult medical examination without abnormal findings: Secondary | ICD-10-CM | POA: Diagnosis not present

## 2018-02-14 DIAGNOSIS — Z23 Encounter for immunization: Secondary | ICD-10-CM | POA: Diagnosis not present

## 2018-02-22 DIAGNOSIS — M79672 Pain in left foot: Secondary | ICD-10-CM | POA: Diagnosis not present

## 2018-02-22 DIAGNOSIS — M25571 Pain in right ankle and joints of right foot: Secondary | ICD-10-CM | POA: Diagnosis not present

## 2018-02-22 DIAGNOSIS — M79671 Pain in right foot: Secondary | ICD-10-CM | POA: Diagnosis not present

## 2018-02-22 DIAGNOSIS — R600 Localized edema: Secondary | ICD-10-CM | POA: Diagnosis not present

## 2018-02-22 DIAGNOSIS — L539 Erythematous condition, unspecified: Secondary | ICD-10-CM | POA: Diagnosis not present

## 2018-02-23 DIAGNOSIS — F0390 Unspecified dementia without behavioral disturbance: Secondary | ICD-10-CM | POA: Diagnosis not present

## 2018-02-23 DIAGNOSIS — M8580 Other specified disorders of bone density and structure, unspecified site: Secondary | ICD-10-CM | POA: Diagnosis not present

## 2018-02-23 DIAGNOSIS — M85871 Other specified disorders of bone density and structure, right ankle and foot: Secondary | ICD-10-CM | POA: Diagnosis not present

## 2018-02-23 DIAGNOSIS — M25471 Effusion, right ankle: Secondary | ICD-10-CM | POA: Diagnosis not present

## 2018-03-05 DIAGNOSIS — F0390 Unspecified dementia without behavioral disturbance: Secondary | ICD-10-CM | POA: Diagnosis not present

## 2018-03-05 DIAGNOSIS — R52 Pain, unspecified: Secondary | ICD-10-CM | POA: Diagnosis not present

## 2018-03-05 DIAGNOSIS — I10 Essential (primary) hypertension: Secondary | ICD-10-CM | POA: Diagnosis not present

## 2018-03-05 DIAGNOSIS — E039 Hypothyroidism, unspecified: Secondary | ICD-10-CM | POA: Diagnosis not present

## 2018-03-08 IMAGING — CT CT HEAD W/O CM
3 series · 15 of 47 positions shown, 18 images · non-contrast
Comparison: 07/25/2009, MRI 03/11/2016

CLINICAL DATA: Confusion with slurred speech

EXAM:
CT HEAD WITHOUT CONTRAST
TECHNIQUE: Contiguous axial images were obtained from the base of the skull
through the vertex without intravenous contrast.

[Series 3: head 5.0 h30s · axial · 0.43mm/px · z∈[-117,+13]mm · 9 of 32 slices shown, 12 images]
[im 3/32  brain]
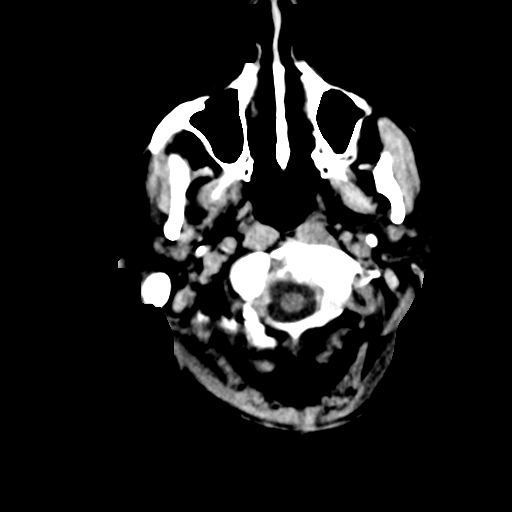
[im 3/32  bone]
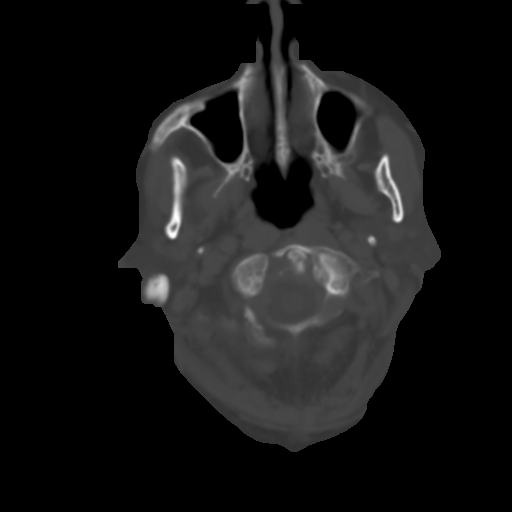
[im 6/32  brain]
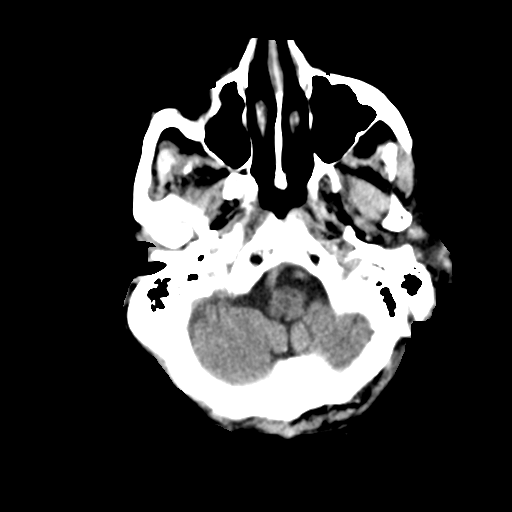
[im 9/32  brain]
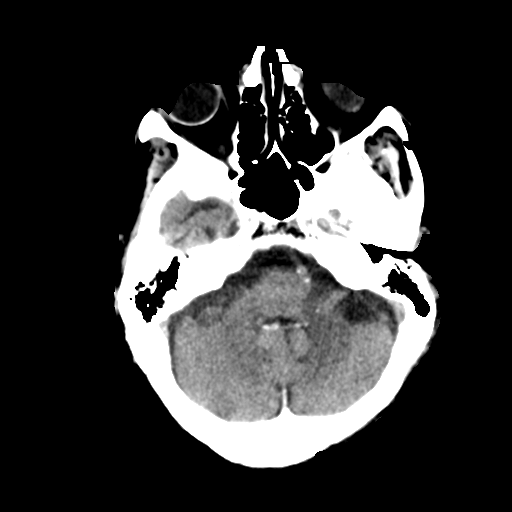
[im 12/32  brain]
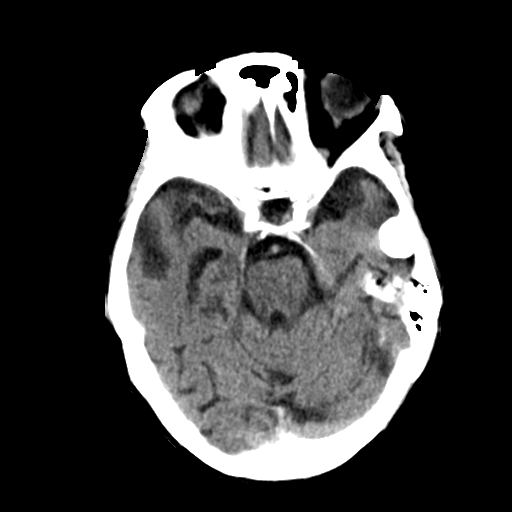
[im 17/32  brain]
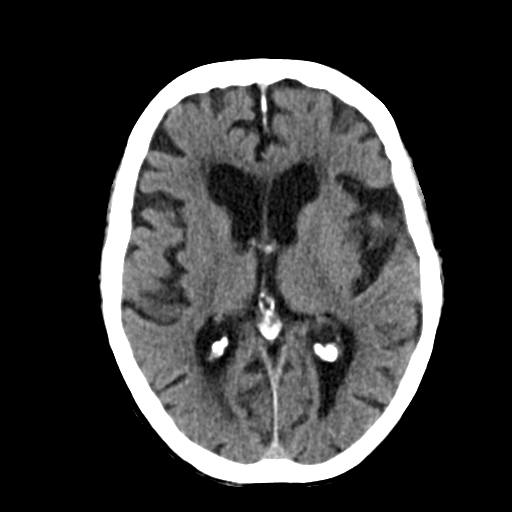
[im 17/32  bone]
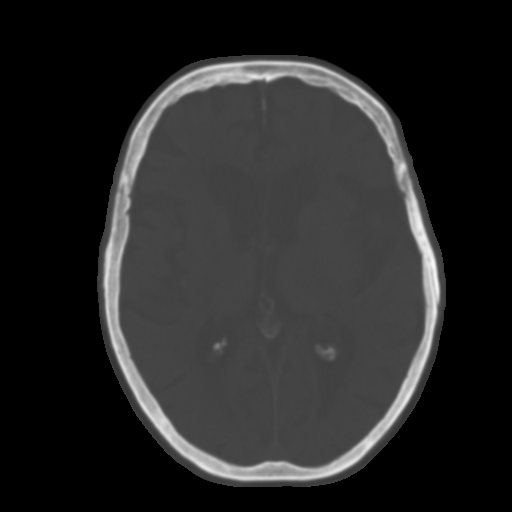
[im 20/32  brain]
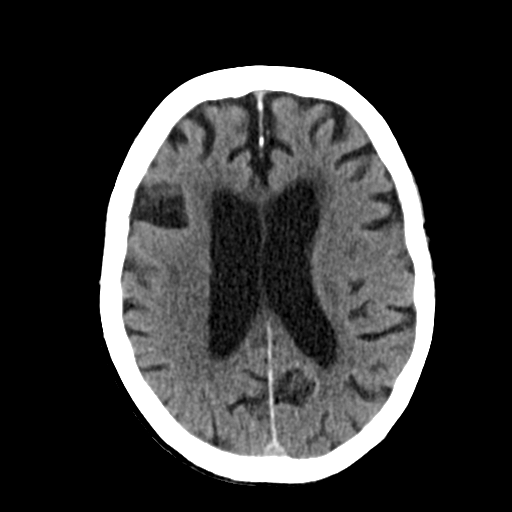
[im 23/32  brain]
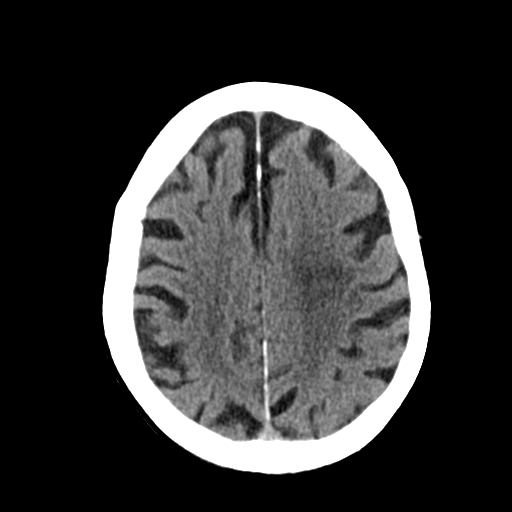
[im 26/32  brain]
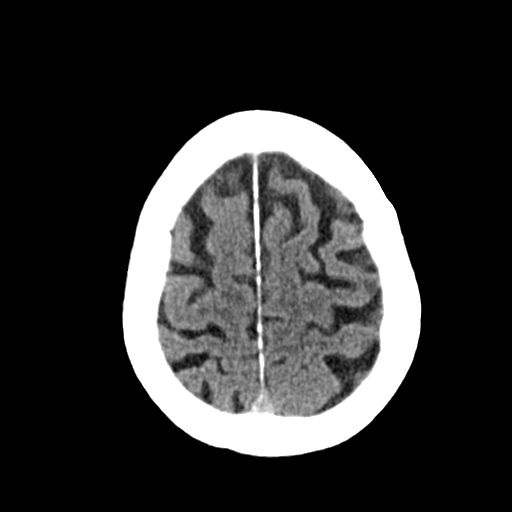
[im 29/32  brain]
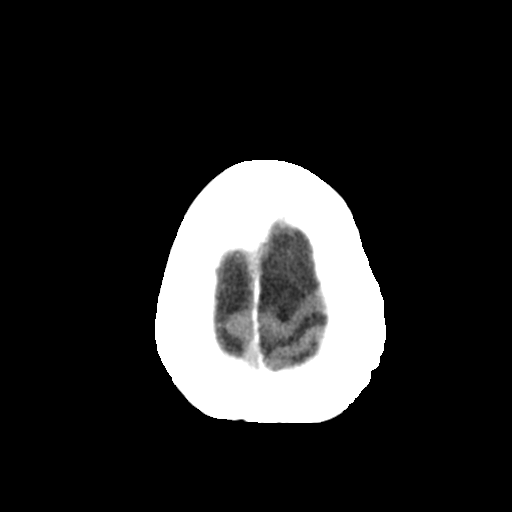
[im 29/32  bone]
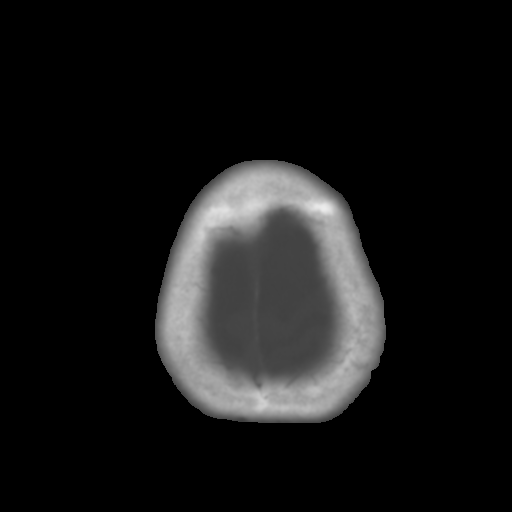

[Series 4: head 3.0 mpr · coronal · 0.30mm/px · 3 of 66 slices shown (1 of 2)]
[im 22/66  brain]
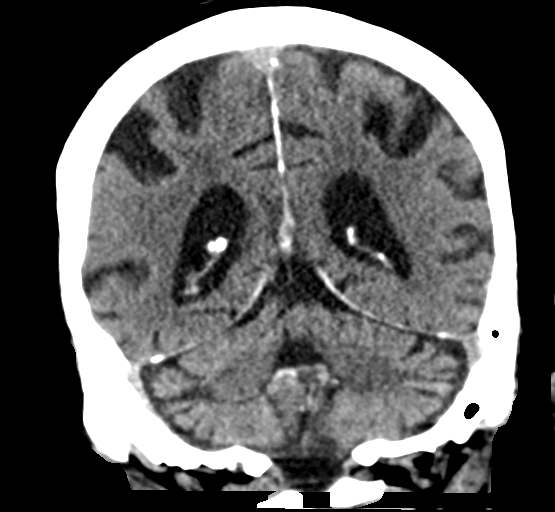
[im 29/66  brain]
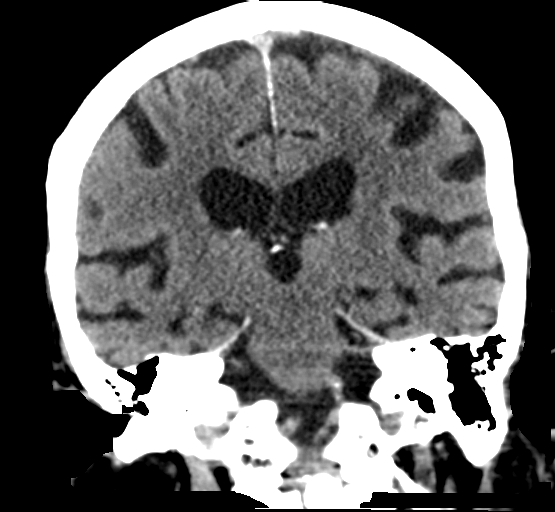
[im 37/66  brain]
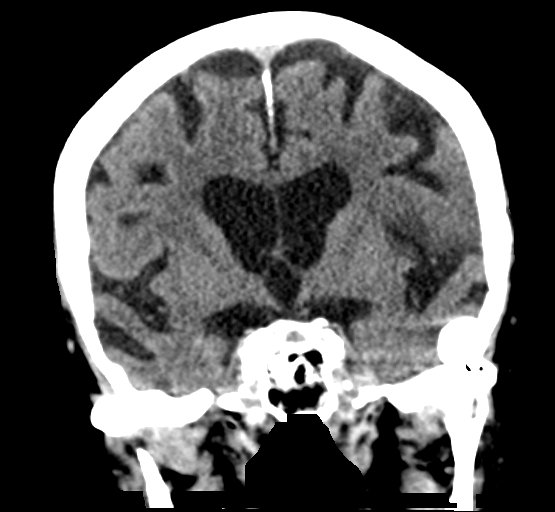

[Series 5: head 3.0 mpr · sagittal · 0.30mm/px · 3 of 53 slices shown (2 of 2)]
[im 18/53  brain]
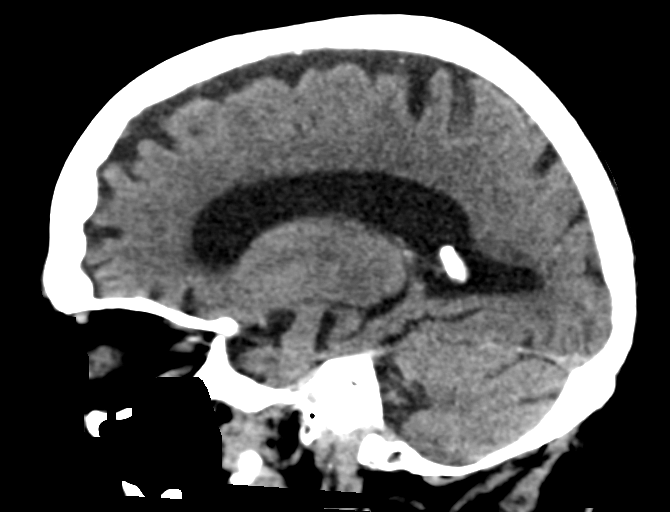
[im 27/53  brain]
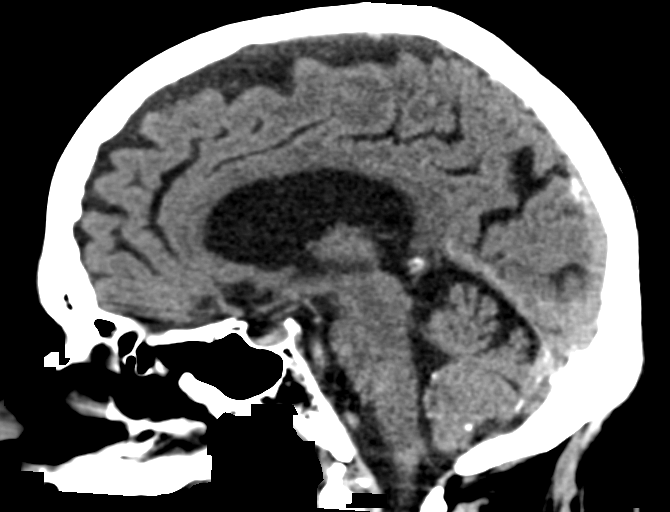
[im 35/53  brain]
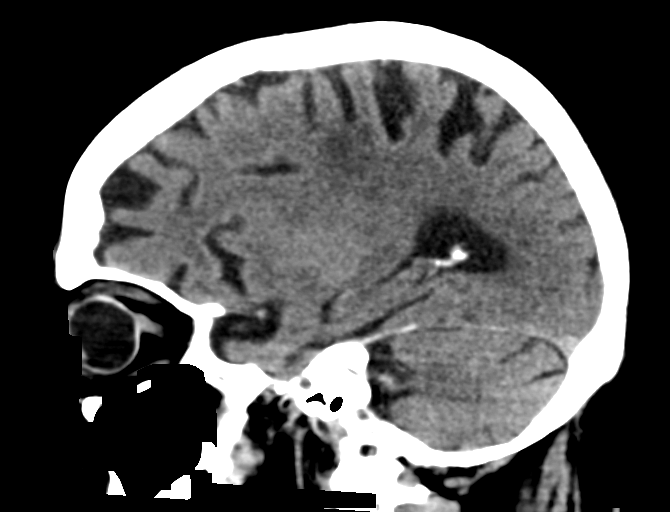

[15 of 47 positions shown; findings below may reference images not displayed]

FINDINGS: Brain: No large territorial infarction or intracranial hemorrhage is
visualized. Minimal periventricular white matter hypodensities
consistent with small vessel disease. Moderate-to-marked global
atrophy. Ventricular enlargement felt secondary to atrophy. Subtle
low-attenuation within the left insular cortex, best seen on coronal
views and felt to correspond to some of the MRI diffusion
abnormality. Stable calcified mass in the left middle cranial fossa.

Vascular: No hyperdense vessels are visualized. Carotid artery
calcifications.

Skull: Mastoid air cells are clear.  There is no fracture.

Sinuses/Orbits: Mild mucosal thickening within the sinuses. No acute
orbital abnormality.

Other: None
IMPRESSION: 1. No evidence for acute intracranial hemorrhage. Subtle low-density
foci within the left temporal lobe and insular cortex, felt to
correspond to some of the MRI diffusion abnormality.
2. Moderate to marked atrophy.

## 2018-03-08 IMAGING — MR MR HEAD W/O CM
8 of 10 series · 36 of 48 positions shown · non-contrast
Comparison: Prior head CT from earlier the same day.

CLINICAL DATA: Initial evaluation for acute right facial droop.

EXAM:
MRI HEAD WITHOUT CONTRAST
TECHNIQUE: Multiplanar, multiecho pulse sequences of the brain and surrounding
structures were obtained without intravenous contrast.

[Series 3: DWI · axial · 3.0mm · 1.09mm/px · z∈[-23,+117]mm · 11 of 96 slices shown (1 of 4)]
[im 1/96]
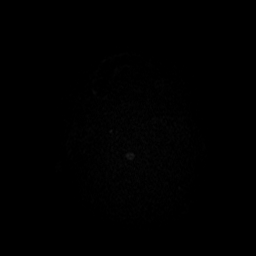
[im 10/96]
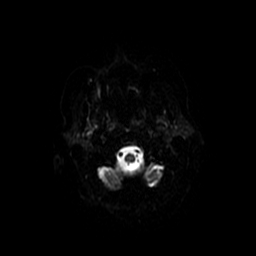
[im 20/96]
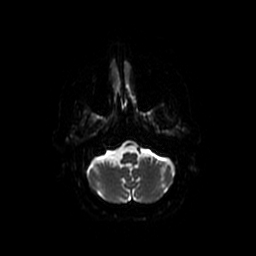
[im 29/96]
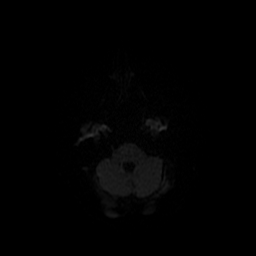
[im 39/96]
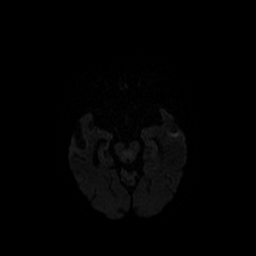
[im 48/96]
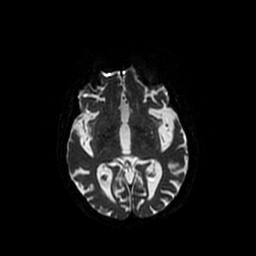
[im 58/96]
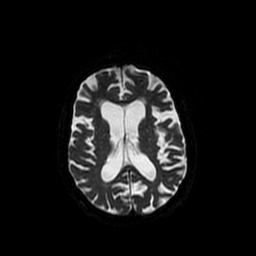
[im 67/96]
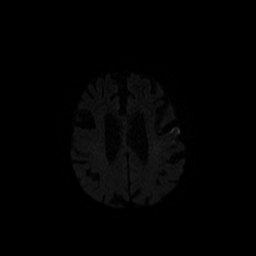
[im 77/96]
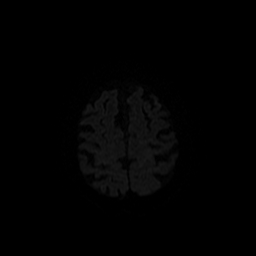
[im 86/96]
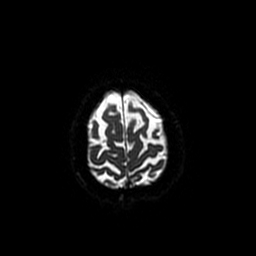
[im 96/96]
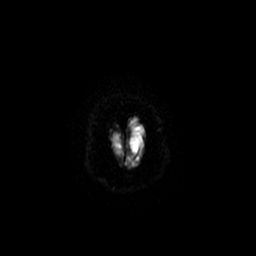

[Series 4: DWI · coronal · 5.0mm · 1.09mm/px · 7 of 68 slices shown (2 of 4)]
[im 1/68]
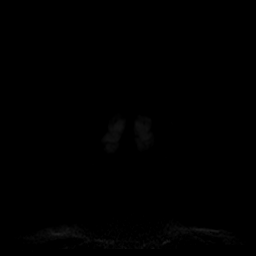
[im 12/68]
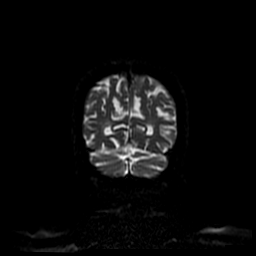
[im 23/68]
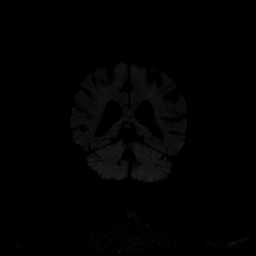
[im 34/68]
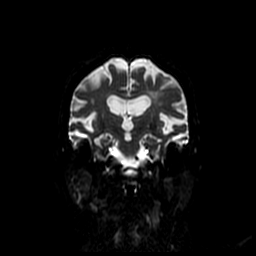
[im 45/68]
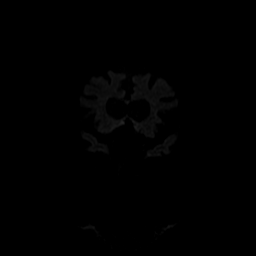
[im 56/68]
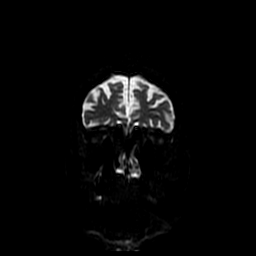
[im 68/68]
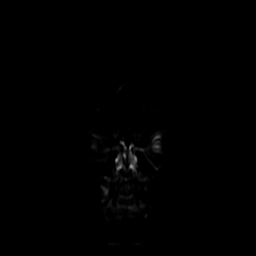

[Series 5: FLAIR · axial · 5.0mm · 0.47mm/px · z∈[-24,+114]mm · 2 of 24 slices shown]
[im 1/24]
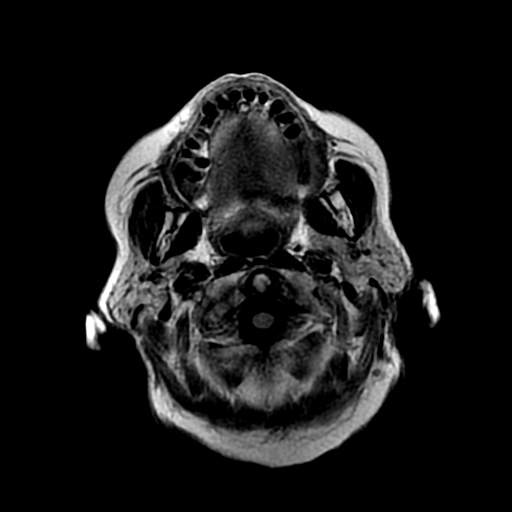
[im 24/24]
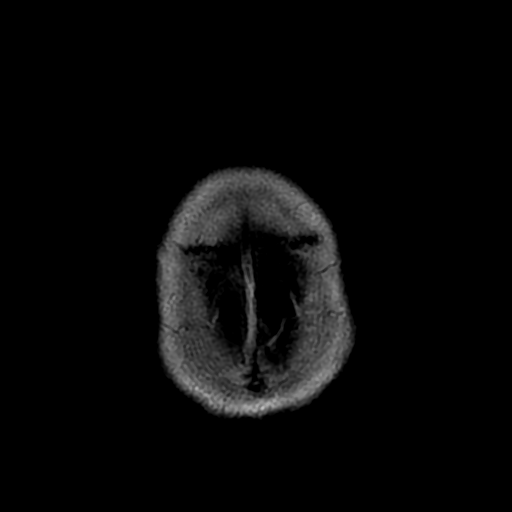

[Series 6: T2 · axial · 5.0mm · 0.47mm/px · z∈[-24,+114]mm · 2 of 24 slices shown (1 of 2)]
[im 1/24]
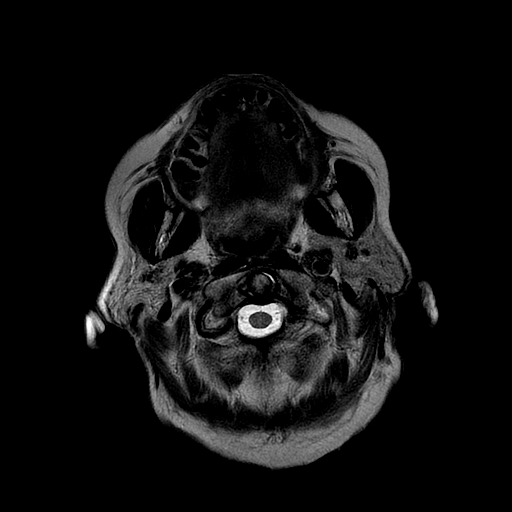
[im 24/24]
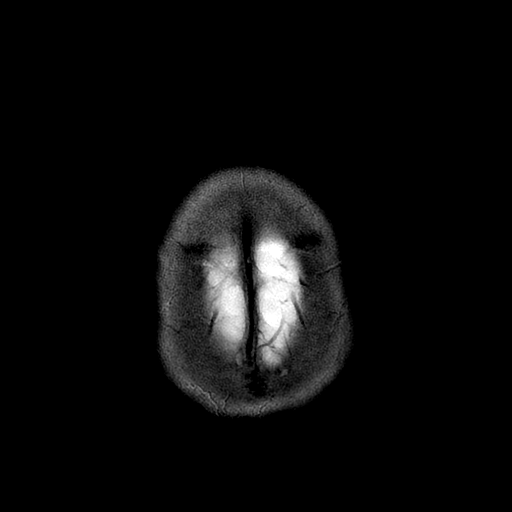

[Series 7: T1 · sagittal · 5.0mm · 0.47mm/px · 2 of 23 slices shown]
[im 1/23]
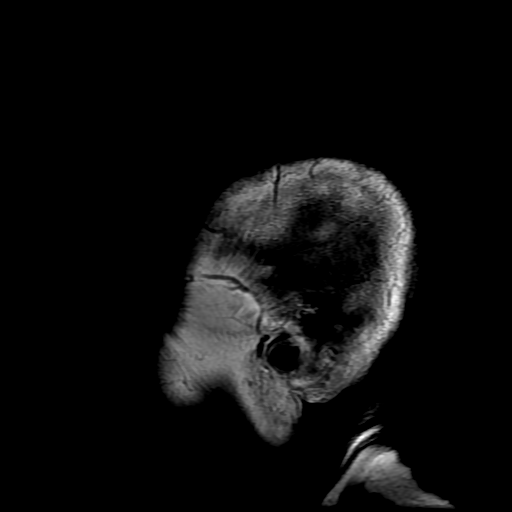
[im 23/23]
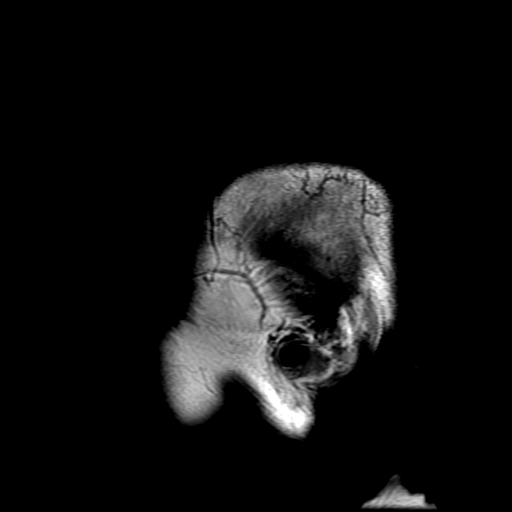

[Series 10: T2 · coronal · 5.0mm · 0.43mm/px · 3 of 29 slices shown (2 of 2)]
[im 1/29]
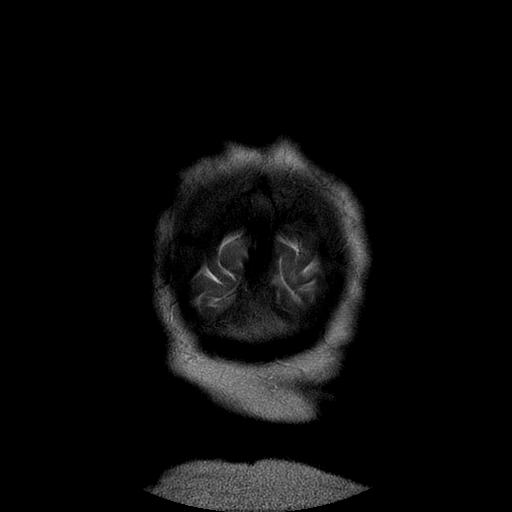
[im 15/29]
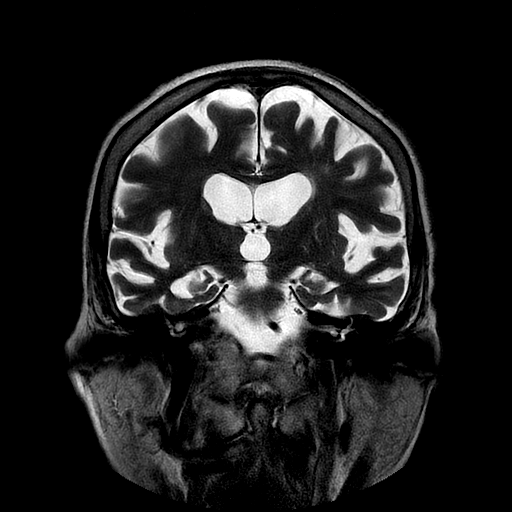
[im 29/29]
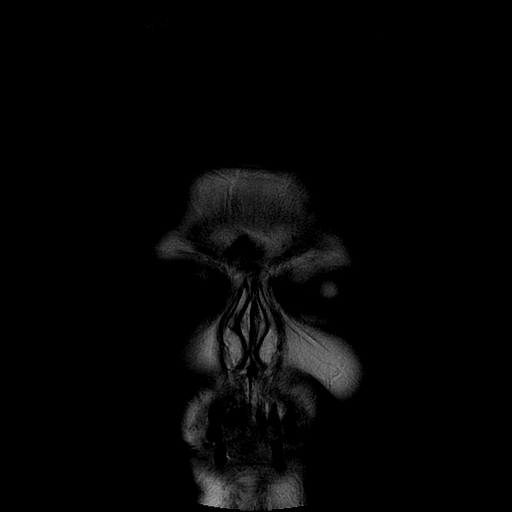

[Series 300: DWI · axial · 3.0mm · 1.09mm/px · z∈[-23,+117]mm · 5 of 48 slices shown (3 of 4)]
[im 1/48]
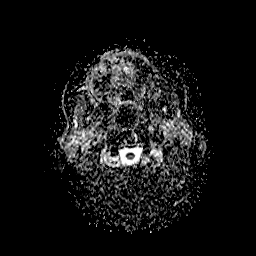
[im 12/48]
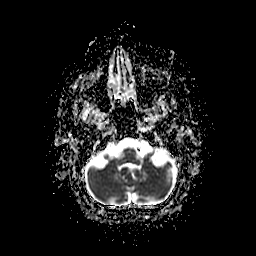
[im 24/48]
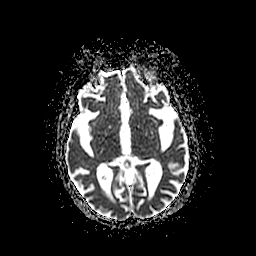
[im 36/48]
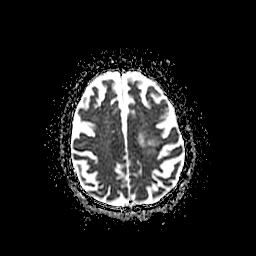
[im 48/48]
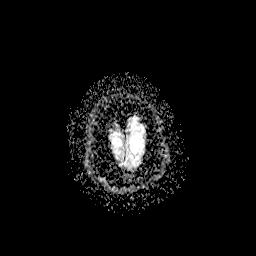

[Series 400: DWI · coronal · 5.0mm · 1.09mm/px · 4 of 34 slices shown (4 of 4)]
[im 1/34]
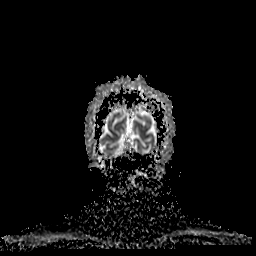
[im 12/34]
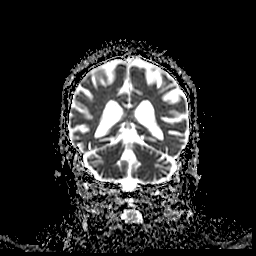
[im 23/34]
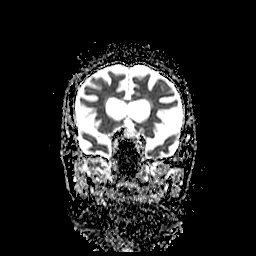
[im 34/34]
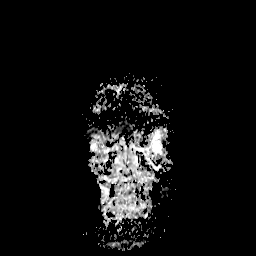

[36 of 48 positions shown; findings below may reference images not displayed]

FINDINGS: Brain: Diffuse prominence of the CSF containing spaces is compatible
with generalized age-related cerebral atrophy. Patchy T2/FLAIR
hyperintensity within the periventricular and deep white matter most
compatible chronic microvascular ischemic changes, mild for age.

There is patchy restricted diffusion involving the cortical gray
matter of the left insular cortex and left frontal operculum,
compatible with acute left MCA territory infarct. Minimal patchy
cortical infarct more superiorly within the left posterior frontal
lobe. No associated hemorrhage or mass effect. Minimal
susceptibility artifact within the left sylvian fissure may reflect
intraluminal thrombus within distal left MCA vasculature. No other
evidence for acute infarct. Gray-white matter differentiation
otherwise maintained. No other evidence for acute or chronic
intracranial hemorrhage.

Densely calcified 2 cm meningioma overlies the left temporal lobe.
No associated edema. The no other mass lesion. No midline shift or
mass effect. Ventricular prominence related to global parenchymal
volume loss without hydrocephalus. No extra-axial fluid collection.
Major dural sinuses are patent.

Pituitary gland within normal limits.

Vascular: Abnormal flow void within the left ICA, which may be
related to slow flow and/ or occlusion (series 6, image 8). Major
intracranial vascular flow voids otherwise maintained.

Skull and upper cervical spine: Craniocervical junction normal.
Degenerative spondylolysis noted within the visualized upper
cervical spine without significant stenosis. Bone marrow signal
intensity normal. No scalp soft tissue abnormality.

Sinuses/Orbits: Globes and orbital soft tissues within normal
limits. Patient is status post cataract extraction bilaterally. Mild
scattered mucosal thickening within the ethmoidal air cells and
maxillary sinuses. No air-fluid level to suggest active sinus
infection. Small right mastoid effusion. Inner ear structures
grossly normal.
IMPRESSION: 1. Small volume acute ischemic left MCA cortical infarct involving
the left insula and frontal operculum. No associated hemorrhage or
mass effect.
2. Abnormal flow void within the left ICA, suspected to be occluded.
This could be further evaluated with dedicated CTA.
3. 2 cm densely calcified meningioma overlying the left temporal
lobe without associated edema.
4. Generalized age-related cerebral atrophy with mild chronic small
vessel ischemic disease.

## 2018-03-13 DIAGNOSIS — Z23 Encounter for immunization: Secondary | ICD-10-CM | POA: Diagnosis not present

## 2018-03-30 DIAGNOSIS — M6281 Muscle weakness (generalized): Secondary | ICD-10-CM | POA: Diagnosis not present

## 2018-03-30 DIAGNOSIS — Z741 Need for assistance with personal care: Secondary | ICD-10-CM | POA: Diagnosis not present

## 2018-04-06 DIAGNOSIS — R4586 Emotional lability: Secondary | ICD-10-CM | POA: Diagnosis not present

## 2018-04-06 DIAGNOSIS — I10 Essential (primary) hypertension: Secondary | ICD-10-CM | POA: Diagnosis not present

## 2018-04-06 DIAGNOSIS — F0390 Unspecified dementia without behavioral disturbance: Secondary | ICD-10-CM | POA: Diagnosis not present

## 2018-04-07 DIAGNOSIS — F0390 Unspecified dementia without behavioral disturbance: Secondary | ICD-10-CM | POA: Diagnosis not present

## 2018-04-08 DIAGNOSIS — R1312 Dysphagia, oropharyngeal phase: Secondary | ICD-10-CM | POA: Diagnosis not present

## 2018-04-09 DIAGNOSIS — R1312 Dysphagia, oropharyngeal phase: Secondary | ICD-10-CM | POA: Diagnosis not present

## 2018-04-10 DIAGNOSIS — R1312 Dysphagia, oropharyngeal phase: Secondary | ICD-10-CM | POA: Diagnosis not present

## 2018-04-13 DIAGNOSIS — R1312 Dysphagia, oropharyngeal phase: Secondary | ICD-10-CM | POA: Diagnosis not present

## 2018-04-14 DIAGNOSIS — R1312 Dysphagia, oropharyngeal phase: Secondary | ICD-10-CM | POA: Diagnosis not present

## 2018-04-15 DIAGNOSIS — R1312 Dysphagia, oropharyngeal phase: Secondary | ICD-10-CM | POA: Diagnosis not present

## 2018-04-16 DIAGNOSIS — R1312 Dysphagia, oropharyngeal phase: Secondary | ICD-10-CM | POA: Diagnosis not present

## 2018-04-17 DIAGNOSIS — R1312 Dysphagia, oropharyngeal phase: Secondary | ICD-10-CM | POA: Diagnosis not present

## 2018-04-20 DIAGNOSIS — R1312 Dysphagia, oropharyngeal phase: Secondary | ICD-10-CM | POA: Diagnosis not present

## 2018-04-21 DIAGNOSIS — R1312 Dysphagia, oropharyngeal phase: Secondary | ICD-10-CM | POA: Diagnosis not present

## 2018-04-22 DIAGNOSIS — R1312 Dysphagia, oropharyngeal phase: Secondary | ICD-10-CM | POA: Diagnosis not present

## 2018-04-23 DIAGNOSIS — R1312 Dysphagia, oropharyngeal phase: Secondary | ICD-10-CM | POA: Diagnosis not present

## 2018-05-05 DIAGNOSIS — F0390 Unspecified dementia without behavioral disturbance: Secondary | ICD-10-CM | POA: Diagnosis not present

## 2018-05-07 DIAGNOSIS — E039 Hypothyroidism, unspecified: Secondary | ICD-10-CM | POA: Diagnosis not present

## 2018-05-07 DIAGNOSIS — I4891 Unspecified atrial fibrillation: Secondary | ICD-10-CM | POA: Diagnosis not present

## 2018-05-07 DIAGNOSIS — R001 Bradycardia, unspecified: Secondary | ICD-10-CM | POA: Diagnosis not present

## 2018-05-07 DIAGNOSIS — I10 Essential (primary) hypertension: Secondary | ICD-10-CM | POA: Diagnosis not present

## 2018-05-19 DIAGNOSIS — I739 Peripheral vascular disease, unspecified: Secondary | ICD-10-CM | POA: Diagnosis not present

## 2018-05-19 DIAGNOSIS — B351 Tinea unguium: Secondary | ICD-10-CM | POA: Diagnosis not present

## 2018-05-19 DIAGNOSIS — L603 Nail dystrophy: Secondary | ICD-10-CM | POA: Diagnosis not present

## 2018-06-19 DIAGNOSIS — F0390 Unspecified dementia without behavioral disturbance: Secondary | ICD-10-CM | POA: Diagnosis not present

## 2018-06-30 DIAGNOSIS — F0391 Unspecified dementia with behavioral disturbance: Secondary | ICD-10-CM | POA: Diagnosis not present

## 2018-07-11 DIAGNOSIS — R2681 Unsteadiness on feet: Secondary | ICD-10-CM | POA: Diagnosis not present

## 2018-07-11 DIAGNOSIS — R1312 Dysphagia, oropharyngeal phase: Secondary | ICD-10-CM | POA: Diagnosis not present

## 2018-07-11 DIAGNOSIS — F0390 Unspecified dementia without behavioral disturbance: Secondary | ICD-10-CM | POA: Diagnosis not present

## 2018-07-11 DIAGNOSIS — M6281 Muscle weakness (generalized): Secondary | ICD-10-CM | POA: Diagnosis not present

## 2018-07-13 DIAGNOSIS — E039 Hypothyroidism, unspecified: Secondary | ICD-10-CM | POA: Diagnosis not present

## 2018-07-13 DIAGNOSIS — R2681 Unsteadiness on feet: Secondary | ICD-10-CM | POA: Diagnosis not present

## 2018-07-13 DIAGNOSIS — I10 Essential (primary) hypertension: Secondary | ICD-10-CM | POA: Diagnosis not present

## 2018-07-13 DIAGNOSIS — G301 Alzheimer's disease with late onset: Secondary | ICD-10-CM | POA: Diagnosis not present

## 2018-07-13 DIAGNOSIS — F0390 Unspecified dementia without behavioral disturbance: Secondary | ICD-10-CM | POA: Diagnosis not present

## 2018-07-13 DIAGNOSIS — M6281 Muscle weakness (generalized): Secondary | ICD-10-CM | POA: Diagnosis not present

## 2018-07-13 DIAGNOSIS — R1312 Dysphagia, oropharyngeal phase: Secondary | ICD-10-CM | POA: Diagnosis not present

## 2018-07-13 DIAGNOSIS — R001 Bradycardia, unspecified: Secondary | ICD-10-CM | POA: Diagnosis not present

## 2018-07-14 DIAGNOSIS — R1312 Dysphagia, oropharyngeal phase: Secondary | ICD-10-CM | POA: Diagnosis not present

## 2018-07-14 DIAGNOSIS — M6281 Muscle weakness (generalized): Secondary | ICD-10-CM | POA: Diagnosis not present

## 2018-07-14 DIAGNOSIS — F0390 Unspecified dementia without behavioral disturbance: Secondary | ICD-10-CM | POA: Diagnosis not present

## 2018-07-14 DIAGNOSIS — R2681 Unsteadiness on feet: Secondary | ICD-10-CM | POA: Diagnosis not present

## 2018-07-14 DIAGNOSIS — Z79899 Other long term (current) drug therapy: Secondary | ICD-10-CM | POA: Diagnosis not present

## 2018-07-15 DIAGNOSIS — F0390 Unspecified dementia without behavioral disturbance: Secondary | ICD-10-CM | POA: Diagnosis not present

## 2018-07-15 DIAGNOSIS — M6281 Muscle weakness (generalized): Secondary | ICD-10-CM | POA: Diagnosis not present

## 2018-07-15 DIAGNOSIS — R1312 Dysphagia, oropharyngeal phase: Secondary | ICD-10-CM | POA: Diagnosis not present

## 2018-07-15 DIAGNOSIS — R2681 Unsteadiness on feet: Secondary | ICD-10-CM | POA: Diagnosis not present

## 2018-07-16 DIAGNOSIS — F0390 Unspecified dementia without behavioral disturbance: Secondary | ICD-10-CM | POA: Diagnosis not present

## 2018-07-16 DIAGNOSIS — R1312 Dysphagia, oropharyngeal phase: Secondary | ICD-10-CM | POA: Diagnosis not present

## 2018-07-16 DIAGNOSIS — R2681 Unsteadiness on feet: Secondary | ICD-10-CM | POA: Diagnosis not present

## 2018-07-16 DIAGNOSIS — M6281 Muscle weakness (generalized): Secondary | ICD-10-CM | POA: Diagnosis not present

## 2018-07-20 DIAGNOSIS — R1312 Dysphagia, oropharyngeal phase: Secondary | ICD-10-CM | POA: Diagnosis not present

## 2018-07-20 DIAGNOSIS — M6281 Muscle weakness (generalized): Secondary | ICD-10-CM | POA: Diagnosis not present

## 2018-07-20 DIAGNOSIS — F0390 Unspecified dementia without behavioral disturbance: Secondary | ICD-10-CM | POA: Diagnosis not present

## 2018-07-20 DIAGNOSIS — R2681 Unsteadiness on feet: Secondary | ICD-10-CM | POA: Diagnosis not present

## 2018-09-08 DIAGNOSIS — I1310 Hypertensive heart and chronic kidney disease without heart failure, with stage 1 through stage 4 chronic kidney disease, or unspecified chronic kidney disease: Secondary | ICD-10-CM | POA: Diagnosis not present

## 2018-09-08 DIAGNOSIS — G301 Alzheimer's disease with late onset: Secondary | ICD-10-CM | POA: Diagnosis not present

## 2018-09-08 DIAGNOSIS — J449 Chronic obstructive pulmonary disease, unspecified: Secondary | ICD-10-CM | POA: Diagnosis not present

## 2018-09-08 DIAGNOSIS — F0280 Dementia in other diseases classified elsewhere without behavioral disturbance: Secondary | ICD-10-CM | POA: Diagnosis not present

## 2018-09-14 DIAGNOSIS — M6281 Muscle weakness (generalized): Secondary | ICD-10-CM | POA: Diagnosis not present

## 2018-09-14 DIAGNOSIS — F0390 Unspecified dementia without behavioral disturbance: Secondary | ICD-10-CM | POA: Diagnosis not present

## 2018-09-14 DIAGNOSIS — I69320 Aphasia following cerebral infarction: Secondary | ICD-10-CM | POA: Diagnosis not present

## 2018-09-14 DIAGNOSIS — Z741 Need for assistance with personal care: Secondary | ICD-10-CM | POA: Diagnosis not present

## 2018-09-14 DIAGNOSIS — I10 Essential (primary) hypertension: Secondary | ICD-10-CM | POA: Diagnosis not present

## 2018-09-14 DIAGNOSIS — R1312 Dysphagia, oropharyngeal phase: Secondary | ICD-10-CM | POA: Diagnosis not present

## 2018-09-14 DIAGNOSIS — E039 Hypothyroidism, unspecified: Secondary | ICD-10-CM | POA: Diagnosis not present

## 2018-09-15 DIAGNOSIS — F0390 Unspecified dementia without behavioral disturbance: Secondary | ICD-10-CM | POA: Diagnosis not present

## 2018-09-15 DIAGNOSIS — I10 Essential (primary) hypertension: Secondary | ICD-10-CM | POA: Diagnosis not present

## 2018-09-15 DIAGNOSIS — Z741 Need for assistance with personal care: Secondary | ICD-10-CM | POA: Diagnosis not present

## 2018-09-15 DIAGNOSIS — I69320 Aphasia following cerebral infarction: Secondary | ICD-10-CM | POA: Diagnosis not present

## 2018-09-15 DIAGNOSIS — M6281 Muscle weakness (generalized): Secondary | ICD-10-CM | POA: Diagnosis not present

## 2018-09-15 DIAGNOSIS — R1312 Dysphagia, oropharyngeal phase: Secondary | ICD-10-CM | POA: Diagnosis not present

## 2018-09-16 DIAGNOSIS — F0390 Unspecified dementia without behavioral disturbance: Secondary | ICD-10-CM | POA: Diagnosis not present

## 2018-09-16 DIAGNOSIS — Z741 Need for assistance with personal care: Secondary | ICD-10-CM | POA: Diagnosis not present

## 2018-09-16 DIAGNOSIS — I69320 Aphasia following cerebral infarction: Secondary | ICD-10-CM | POA: Diagnosis not present

## 2018-09-16 DIAGNOSIS — M6281 Muscle weakness (generalized): Secondary | ICD-10-CM | POA: Diagnosis not present

## 2018-09-16 DIAGNOSIS — I10 Essential (primary) hypertension: Secondary | ICD-10-CM | POA: Diagnosis not present

## 2018-09-16 DIAGNOSIS — R1312 Dysphagia, oropharyngeal phase: Secondary | ICD-10-CM | POA: Diagnosis not present

## 2018-09-17 DIAGNOSIS — F0390 Unspecified dementia without behavioral disturbance: Secondary | ICD-10-CM | POA: Diagnosis not present

## 2018-09-17 DIAGNOSIS — R1312 Dysphagia, oropharyngeal phase: Secondary | ICD-10-CM | POA: Diagnosis not present

## 2018-09-17 DIAGNOSIS — I10 Essential (primary) hypertension: Secondary | ICD-10-CM | POA: Diagnosis not present

## 2018-09-17 DIAGNOSIS — Z741 Need for assistance with personal care: Secondary | ICD-10-CM | POA: Diagnosis not present

## 2018-09-17 DIAGNOSIS — M6281 Muscle weakness (generalized): Secondary | ICD-10-CM | POA: Diagnosis not present

## 2018-09-17 DIAGNOSIS — I69320 Aphasia following cerebral infarction: Secondary | ICD-10-CM | POA: Diagnosis not present

## 2018-09-18 DIAGNOSIS — R1312 Dysphagia, oropharyngeal phase: Secondary | ICD-10-CM | POA: Diagnosis not present

## 2018-09-18 DIAGNOSIS — I69320 Aphasia following cerebral infarction: Secondary | ICD-10-CM | POA: Diagnosis not present

## 2018-09-18 DIAGNOSIS — M6281 Muscle weakness (generalized): Secondary | ICD-10-CM | POA: Diagnosis not present

## 2018-09-18 DIAGNOSIS — F0390 Unspecified dementia without behavioral disturbance: Secondary | ICD-10-CM | POA: Diagnosis not present

## 2018-09-18 DIAGNOSIS — I10 Essential (primary) hypertension: Secondary | ICD-10-CM | POA: Diagnosis not present

## 2018-09-18 DIAGNOSIS — Z741 Need for assistance with personal care: Secondary | ICD-10-CM | POA: Diagnosis not present

## 2018-09-20 DIAGNOSIS — M6281 Muscle weakness (generalized): Secondary | ICD-10-CM | POA: Diagnosis not present

## 2018-09-20 DIAGNOSIS — F0390 Unspecified dementia without behavioral disturbance: Secondary | ICD-10-CM | POA: Diagnosis not present

## 2018-09-20 DIAGNOSIS — R1312 Dysphagia, oropharyngeal phase: Secondary | ICD-10-CM | POA: Diagnosis not present

## 2018-09-20 DIAGNOSIS — I10 Essential (primary) hypertension: Secondary | ICD-10-CM | POA: Diagnosis not present

## 2018-09-20 DIAGNOSIS — I69320 Aphasia following cerebral infarction: Secondary | ICD-10-CM | POA: Diagnosis not present

## 2018-09-20 DIAGNOSIS — Z741 Need for assistance with personal care: Secondary | ICD-10-CM | POA: Diagnosis not present

## 2018-09-21 DIAGNOSIS — I69320 Aphasia following cerebral infarction: Secondary | ICD-10-CM | POA: Diagnosis not present

## 2018-09-21 DIAGNOSIS — R1312 Dysphagia, oropharyngeal phase: Secondary | ICD-10-CM | POA: Diagnosis not present

## 2018-09-21 DIAGNOSIS — F0390 Unspecified dementia without behavioral disturbance: Secondary | ICD-10-CM | POA: Diagnosis not present

## 2018-09-21 DIAGNOSIS — Z741 Need for assistance with personal care: Secondary | ICD-10-CM | POA: Diagnosis not present

## 2018-09-21 DIAGNOSIS — M6281 Muscle weakness (generalized): Secondary | ICD-10-CM | POA: Diagnosis not present

## 2018-09-21 DIAGNOSIS — I10 Essential (primary) hypertension: Secondary | ICD-10-CM | POA: Diagnosis not present

## 2018-09-22 DIAGNOSIS — M6281 Muscle weakness (generalized): Secondary | ICD-10-CM | POA: Diagnosis not present

## 2018-09-22 DIAGNOSIS — I10 Essential (primary) hypertension: Secondary | ICD-10-CM | POA: Diagnosis not present

## 2018-09-22 DIAGNOSIS — Z741 Need for assistance with personal care: Secondary | ICD-10-CM | POA: Diagnosis not present

## 2018-09-22 DIAGNOSIS — I69320 Aphasia following cerebral infarction: Secondary | ICD-10-CM | POA: Diagnosis not present

## 2018-09-22 DIAGNOSIS — R1312 Dysphagia, oropharyngeal phase: Secondary | ICD-10-CM | POA: Diagnosis not present

## 2018-09-22 DIAGNOSIS — F0390 Unspecified dementia without behavioral disturbance: Secondary | ICD-10-CM | POA: Diagnosis not present

## 2018-09-23 DIAGNOSIS — I10 Essential (primary) hypertension: Secondary | ICD-10-CM | POA: Diagnosis not present

## 2018-09-23 DIAGNOSIS — F0390 Unspecified dementia without behavioral disturbance: Secondary | ICD-10-CM | POA: Diagnosis not present

## 2018-09-23 DIAGNOSIS — I69320 Aphasia following cerebral infarction: Secondary | ICD-10-CM | POA: Diagnosis not present

## 2018-09-23 DIAGNOSIS — R1312 Dysphagia, oropharyngeal phase: Secondary | ICD-10-CM | POA: Diagnosis not present

## 2018-09-23 DIAGNOSIS — Z741 Need for assistance with personal care: Secondary | ICD-10-CM | POA: Diagnosis not present

## 2018-09-23 DIAGNOSIS — M6281 Muscle weakness (generalized): Secondary | ICD-10-CM | POA: Diagnosis not present

## 2018-09-24 DIAGNOSIS — M6281 Muscle weakness (generalized): Secondary | ICD-10-CM | POA: Diagnosis not present

## 2018-09-24 DIAGNOSIS — Z741 Need for assistance with personal care: Secondary | ICD-10-CM | POA: Diagnosis not present

## 2018-09-24 DIAGNOSIS — R1312 Dysphagia, oropharyngeal phase: Secondary | ICD-10-CM | POA: Diagnosis not present

## 2018-09-24 DIAGNOSIS — I10 Essential (primary) hypertension: Secondary | ICD-10-CM | POA: Diagnosis not present

## 2018-09-24 DIAGNOSIS — F0390 Unspecified dementia without behavioral disturbance: Secondary | ICD-10-CM | POA: Diagnosis not present

## 2018-09-24 DIAGNOSIS — I69320 Aphasia following cerebral infarction: Secondary | ICD-10-CM | POA: Diagnosis not present

## 2018-09-30 DIAGNOSIS — L603 Nail dystrophy: Secondary | ICD-10-CM | POA: Diagnosis not present

## 2018-09-30 DIAGNOSIS — B351 Tinea unguium: Secondary | ICD-10-CM | POA: Diagnosis not present

## 2018-09-30 DIAGNOSIS — I739 Peripheral vascular disease, unspecified: Secondary | ICD-10-CM | POA: Diagnosis not present

## 2018-11-03 DIAGNOSIS — J449 Chronic obstructive pulmonary disease, unspecified: Secondary | ICD-10-CM | POA: Diagnosis not present

## 2018-11-03 DIAGNOSIS — F0391 Unspecified dementia with behavioral disturbance: Secondary | ICD-10-CM | POA: Diagnosis not present

## 2018-11-03 DIAGNOSIS — E039 Hypothyroidism, unspecified: Secondary | ICD-10-CM | POA: Diagnosis not present

## 2018-11-03 DIAGNOSIS — I10 Essential (primary) hypertension: Secondary | ICD-10-CM | POA: Diagnosis not present

## 2018-12-03 ENCOUNTER — Other Ambulatory Visit: Payer: Self-pay

## 2018-12-18 DIAGNOSIS — I739 Peripheral vascular disease, unspecified: Secondary | ICD-10-CM | POA: Diagnosis not present

## 2018-12-18 DIAGNOSIS — L603 Nail dystrophy: Secondary | ICD-10-CM | POA: Diagnosis not present

## 2018-12-18 DIAGNOSIS — B351 Tinea unguium: Secondary | ICD-10-CM | POA: Diagnosis not present

## 2018-12-29 DIAGNOSIS — I4891 Unspecified atrial fibrillation: Secondary | ICD-10-CM | POA: Diagnosis not present

## 2018-12-29 DIAGNOSIS — J449 Chronic obstructive pulmonary disease, unspecified: Secondary | ICD-10-CM | POA: Diagnosis not present

## 2018-12-29 DIAGNOSIS — N183 Chronic kidney disease, stage 3 (moderate): Secondary | ICD-10-CM | POA: Diagnosis not present

## 2018-12-29 DIAGNOSIS — I1310 Hypertensive heart and chronic kidney disease without heart failure, with stage 1 through stage 4 chronic kidney disease, or unspecified chronic kidney disease: Secondary | ICD-10-CM | POA: Diagnosis not present

## 2019-01-12 DIAGNOSIS — M818 Other osteoporosis without current pathological fracture: Secondary | ICD-10-CM | POA: Diagnosis not present

## 2019-01-12 DIAGNOSIS — E039 Hypothyroidism, unspecified: Secondary | ICD-10-CM | POA: Diagnosis not present

## 2019-01-12 DIAGNOSIS — M8580 Other specified disorders of bone density and structure, unspecified site: Secondary | ICD-10-CM | POA: Diagnosis not present

## 2019-01-12 DIAGNOSIS — M6281 Muscle weakness (generalized): Secondary | ICD-10-CM | POA: Diagnosis not present

## 2019-01-12 DIAGNOSIS — F0390 Unspecified dementia without behavioral disturbance: Secondary | ICD-10-CM | POA: Diagnosis not present

## 2019-01-12 DIAGNOSIS — I10 Essential (primary) hypertension: Secondary | ICD-10-CM | POA: Diagnosis not present

## 2019-01-12 DIAGNOSIS — E58 Dietary calcium deficiency: Secondary | ICD-10-CM | POA: Diagnosis not present

## 2019-01-12 DIAGNOSIS — Z741 Need for assistance with personal care: Secondary | ICD-10-CM | POA: Diagnosis not present

## 2019-01-12 DIAGNOSIS — E785 Hyperlipidemia, unspecified: Secondary | ICD-10-CM | POA: Diagnosis not present

## 2019-01-12 DIAGNOSIS — I158 Other secondary hypertension: Secondary | ICD-10-CM | POA: Diagnosis not present

## 2019-01-12 DIAGNOSIS — N183 Chronic kidney disease, stage 3 (moderate): Secondary | ICD-10-CM | POA: Diagnosis not present

## 2019-01-13 DIAGNOSIS — E039 Hypothyroidism, unspecified: Secondary | ICD-10-CM | POA: Diagnosis not present

## 2019-01-13 DIAGNOSIS — F0390 Unspecified dementia without behavioral disturbance: Secondary | ICD-10-CM | POA: Diagnosis not present

## 2019-01-13 DIAGNOSIS — I10 Essential (primary) hypertension: Secondary | ICD-10-CM | POA: Diagnosis not present

## 2019-01-13 DIAGNOSIS — M6281 Muscle weakness (generalized): Secondary | ICD-10-CM | POA: Diagnosis not present

## 2019-01-13 DIAGNOSIS — Z741 Need for assistance with personal care: Secondary | ICD-10-CM | POA: Diagnosis not present

## 2019-01-14 DIAGNOSIS — M6281 Muscle weakness (generalized): Secondary | ICD-10-CM | POA: Diagnosis not present

## 2019-01-14 DIAGNOSIS — F0390 Unspecified dementia without behavioral disturbance: Secondary | ICD-10-CM | POA: Diagnosis not present

## 2019-01-14 DIAGNOSIS — E039 Hypothyroidism, unspecified: Secondary | ICD-10-CM | POA: Diagnosis not present

## 2019-01-14 DIAGNOSIS — Z741 Need for assistance with personal care: Secondary | ICD-10-CM | POA: Diagnosis not present

## 2019-01-14 DIAGNOSIS — I10 Essential (primary) hypertension: Secondary | ICD-10-CM | POA: Diagnosis not present

## 2019-01-15 DIAGNOSIS — I10 Essential (primary) hypertension: Secondary | ICD-10-CM | POA: Diagnosis not present

## 2019-01-15 DIAGNOSIS — E039 Hypothyroidism, unspecified: Secondary | ICD-10-CM | POA: Diagnosis not present

## 2019-01-15 DIAGNOSIS — Z741 Need for assistance with personal care: Secondary | ICD-10-CM | POA: Diagnosis not present

## 2019-01-15 DIAGNOSIS — F0390 Unspecified dementia without behavioral disturbance: Secondary | ICD-10-CM | POA: Diagnosis not present

## 2019-01-15 DIAGNOSIS — M6281 Muscle weakness (generalized): Secondary | ICD-10-CM | POA: Diagnosis not present

## 2019-01-16 DIAGNOSIS — E039 Hypothyroidism, unspecified: Secondary | ICD-10-CM | POA: Diagnosis not present

## 2019-01-16 DIAGNOSIS — Z741 Need for assistance with personal care: Secondary | ICD-10-CM | POA: Diagnosis not present

## 2019-01-16 DIAGNOSIS — M6281 Muscle weakness (generalized): Secondary | ICD-10-CM | POA: Diagnosis not present

## 2019-01-16 DIAGNOSIS — I10 Essential (primary) hypertension: Secondary | ICD-10-CM | POA: Diagnosis not present

## 2019-01-16 DIAGNOSIS — F0390 Unspecified dementia without behavioral disturbance: Secondary | ICD-10-CM | POA: Diagnosis not present

## 2019-01-18 DIAGNOSIS — E039 Hypothyroidism, unspecified: Secondary | ICD-10-CM | POA: Diagnosis not present

## 2019-01-18 DIAGNOSIS — Z741 Need for assistance with personal care: Secondary | ICD-10-CM | POA: Diagnosis not present

## 2019-01-18 DIAGNOSIS — M6281 Muscle weakness (generalized): Secondary | ICD-10-CM | POA: Diagnosis not present

## 2019-01-18 DIAGNOSIS — F0390 Unspecified dementia without behavioral disturbance: Secondary | ICD-10-CM | POA: Diagnosis not present

## 2019-01-18 DIAGNOSIS — I10 Essential (primary) hypertension: Secondary | ICD-10-CM | POA: Diagnosis not present

## 2019-01-18 DIAGNOSIS — U071 COVID-19: Secondary | ICD-10-CM | POA: Diagnosis not present

## 2019-01-19 DIAGNOSIS — M6281 Muscle weakness (generalized): Secondary | ICD-10-CM | POA: Diagnosis not present

## 2019-01-19 DIAGNOSIS — I10 Essential (primary) hypertension: Secondary | ICD-10-CM | POA: Diagnosis not present

## 2019-01-19 DIAGNOSIS — F0390 Unspecified dementia without behavioral disturbance: Secondary | ICD-10-CM | POA: Diagnosis not present

## 2019-01-19 DIAGNOSIS — E039 Hypothyroidism, unspecified: Secondary | ICD-10-CM | POA: Diagnosis not present

## 2019-01-19 DIAGNOSIS — Z741 Need for assistance with personal care: Secondary | ICD-10-CM | POA: Diagnosis not present

## 2019-01-20 DIAGNOSIS — Z741 Need for assistance with personal care: Secondary | ICD-10-CM | POA: Diagnosis not present

## 2019-01-20 DIAGNOSIS — F0390 Unspecified dementia without behavioral disturbance: Secondary | ICD-10-CM | POA: Diagnosis not present

## 2019-01-20 DIAGNOSIS — I10 Essential (primary) hypertension: Secondary | ICD-10-CM | POA: Diagnosis not present

## 2019-01-20 DIAGNOSIS — E039 Hypothyroidism, unspecified: Secondary | ICD-10-CM | POA: Diagnosis not present

## 2019-01-20 DIAGNOSIS — M6281 Muscle weakness (generalized): Secondary | ICD-10-CM | POA: Diagnosis not present

## 2019-01-21 DIAGNOSIS — M6281 Muscle weakness (generalized): Secondary | ICD-10-CM | POA: Diagnosis not present

## 2019-01-21 DIAGNOSIS — I10 Essential (primary) hypertension: Secondary | ICD-10-CM | POA: Diagnosis not present

## 2019-01-21 DIAGNOSIS — Z741 Need for assistance with personal care: Secondary | ICD-10-CM | POA: Diagnosis not present

## 2019-01-21 DIAGNOSIS — E039 Hypothyroidism, unspecified: Secondary | ICD-10-CM | POA: Diagnosis not present

## 2019-01-21 DIAGNOSIS — F0390 Unspecified dementia without behavioral disturbance: Secondary | ICD-10-CM | POA: Diagnosis not present

## 2019-02-06 DIAGNOSIS — Z23 Encounter for immunization: Secondary | ICD-10-CM | POA: Diagnosis not present

## 2019-02-25 DIAGNOSIS — E039 Hypothyroidism, unspecified: Secondary | ICD-10-CM | POA: Diagnosis not present

## 2019-02-25 DIAGNOSIS — R001 Bradycardia, unspecified: Secondary | ICD-10-CM | POA: Diagnosis not present

## 2019-02-25 DIAGNOSIS — I10 Essential (primary) hypertension: Secondary | ICD-10-CM | POA: Diagnosis not present

## 2019-02-25 DIAGNOSIS — J449 Chronic obstructive pulmonary disease, unspecified: Secondary | ICD-10-CM | POA: Diagnosis not present

## 2019-03-07 DIAGNOSIS — U071 COVID-19: Secondary | ICD-10-CM | POA: Diagnosis not present

## 2019-03-14 DIAGNOSIS — U071 COVID-19: Secondary | ICD-10-CM | POA: Diagnosis not present

## 2019-03-21 DIAGNOSIS — U071 COVID-19: Secondary | ICD-10-CM | POA: Diagnosis not present

## 2019-03-28 DIAGNOSIS — U071 COVID-19: Secondary | ICD-10-CM | POA: Diagnosis not present

## 2019-04-04 DIAGNOSIS — U071 COVID-19: Secondary | ICD-10-CM | POA: Diagnosis not present

## 2019-04-15 ENCOUNTER — Other Ambulatory Visit: Payer: Self-pay

## 2019-04-15 ENCOUNTER — Emergency Department (HOSPITAL_COMMUNITY)
Admission: EM | Admit: 2019-04-15 | Discharge: 2019-04-16 | Disposition: A | Discharge status: Home / Self Care | Payer: Medicare Other | Attending: Emergency Medicine | Admitting: Emergency Medicine

## 2019-04-15 DIAGNOSIS — F0390 Unspecified dementia without behavioral disturbance: Secondary | ICD-10-CM | POA: Insufficient documentation

## 2019-04-15 DIAGNOSIS — R296 Repeated falls: Secondary | ICD-10-CM | POA: Insufficient documentation

## 2019-04-15 DIAGNOSIS — N390 Urinary tract infection, site not specified: Secondary | ICD-10-CM | POA: Insufficient documentation

## 2019-04-15 DIAGNOSIS — N189 Chronic kidney disease, unspecified: Secondary | ICD-10-CM | POA: Insufficient documentation

## 2019-04-15 DIAGNOSIS — Y92129 Unspecified place in nursing home as the place of occurrence of the external cause: Secondary | ICD-10-CM | POA: Diagnosis not present

## 2019-04-15 DIAGNOSIS — M79652 Pain in left thigh: Secondary | ICD-10-CM | POA: Diagnosis not present

## 2019-04-15 DIAGNOSIS — U071 COVID-19: Secondary | ICD-10-CM | POA: Insufficient documentation

## 2019-04-15 DIAGNOSIS — M25552 Pain in left hip: Secondary | ICD-10-CM | POA: Diagnosis not present

## 2019-04-15 DIAGNOSIS — W19XXXA Unspecified fall, initial encounter: Secondary | ICD-10-CM | POA: Diagnosis not present

## 2019-04-15 DIAGNOSIS — S82892A Other fracture of left lower leg, initial encounter for closed fracture: Secondary | ICD-10-CM | POA: Insufficient documentation

## 2019-04-15 DIAGNOSIS — Y939 Activity, unspecified: Secondary | ICD-10-CM | POA: Insufficient documentation

## 2019-04-15 DIAGNOSIS — Y999 Unspecified external cause status: Secondary | ICD-10-CM | POA: Diagnosis not present

## 2019-04-15 DIAGNOSIS — W050XXA Fall from non-moving wheelchair, initial encounter: Secondary | ICD-10-CM | POA: Insufficient documentation

## 2019-04-15 DIAGNOSIS — Z79899 Other long term (current) drug therapy: Secondary | ICD-10-CM | POA: Insufficient documentation

## 2019-04-15 DIAGNOSIS — I129 Hypertensive chronic kidney disease with stage 1 through stage 4 chronic kidney disease, or unspecified chronic kidney disease: Secondary | ICD-10-CM | POA: Insufficient documentation

## 2019-04-15 DIAGNOSIS — M25562 Pain in left knee: Secondary | ICD-10-CM | POA: Diagnosis not present

## 2019-04-15 DIAGNOSIS — S99912A Unspecified injury of left ankle, initial encounter: Secondary | ICD-10-CM | POA: Diagnosis present

## 2019-04-15 DIAGNOSIS — M79662 Pain in left lower leg: Secondary | ICD-10-CM | POA: Diagnosis not present

## 2019-04-15 NOTE — ED Triage Notes (Signed)
Pt arrived from [facility] via [facility] due to an unwitnessed fall. Facility stated pt has tested positive for COVID 19. Pt unable to move Left leg. Pt is DNR.  BP 140/80 HR 67 RR 18 SpO2 96% Temp 98.2

## 2019-04-16 ENCOUNTER — Other Ambulatory Visit: Payer: Self-pay

## 2019-04-16 ENCOUNTER — Encounter (HOSPITAL_COMMUNITY): Payer: Self-pay | Admitting: Emergency Medicine

## 2019-04-16 ENCOUNTER — Emergency Department (HOSPITAL_COMMUNITY): Payer: Medicare Other

## 2019-04-16 DIAGNOSIS — W19XXXA Unspecified fall, initial encounter: Secondary | ICD-10-CM | POA: Diagnosis not present

## 2019-04-16 DIAGNOSIS — M25562 Pain in left knee: Secondary | ICD-10-CM | POA: Diagnosis not present

## 2019-04-16 DIAGNOSIS — S82892A Other fracture of left lower leg, initial encounter for closed fracture: Secondary | ICD-10-CM | POA: Diagnosis not present

## 2019-04-16 LAB — CBC WITH DIFFERENTIAL/PLATELET
Abs Immature Granulocytes: 0.04 10*3/uL (ref 0.00–0.07)
Basophils Absolute: 0 10*3/uL (ref 0.0–0.1)
Basophils Relative: 0 %
Eosinophils Absolute: 0 10*3/uL (ref 0.0–0.5)
Eosinophils Relative: 0 %
HCT: 44 % (ref 36.0–46.0)
Hemoglobin: 14.3 g/dL (ref 12.0–15.0)
Immature Granulocytes: 0 %
Lymphocytes Relative: 11 %
Lymphs Abs: 1.1 10*3/uL (ref 0.7–4.0)
MCH: 31.2 pg (ref 26.0–34.0)
MCHC: 32.5 g/dL (ref 30.0–36.0)
MCV: 96.1 fL (ref 80.0–100.0)
Monocytes Absolute: 1.1 10*3/uL — ABNORMAL HIGH (ref 0.1–1.0)
Monocytes Relative: 11 %
Neutro Abs: 7.5 10*3/uL (ref 1.7–7.7)
Neutrophils Relative %: 78 %
Platelets: 165 10*3/uL (ref 150–400)
RBC: 4.58 MIL/uL (ref 3.87–5.11)
RDW: 12.8 % (ref 11.5–15.5)
WBC: 9.7 10*3/uL (ref 4.0–10.5)
nRBC: 0 % (ref 0.0–0.2)

## 2019-04-16 LAB — URINALYSIS, ROUTINE W REFLEX MICROSCOPIC
Bilirubin Urine: NEGATIVE
Glucose, UA: NEGATIVE mg/dL
Ketones, ur: 5 mg/dL — AB
Nitrite: POSITIVE — AB
Protein, ur: >=300 mg/dL — AB
RBC / HPF: >50 RBC/hpf — ABNORMAL HIGH (ref 0–5)
Specific Gravity, Urine: 1.016 (ref 1.005–1.030)
WBC, UA: >50 WBC/hpf — ABNORMAL HIGH (ref 0–5)
pH: 6 (ref 5.0–8.0)

## 2019-04-16 LAB — BASIC METABOLIC PANEL
Anion gap: 12 (ref 5–15)
BUN: 23 mg/dL (ref 8–23)
CO2: 23 mmol/L (ref 22–32)
Calcium: 8.8 mg/dL — ABNORMAL LOW (ref 8.9–10.3)
Chloride: 106 mmol/L (ref 98–111)
Creatinine, Ser: 0.89 mg/dL (ref 0.44–1.00)
GFR calc Af Amer: >60 mL/min (ref >60)
GFR calc non Af Amer: 52 mL/min — ABNORMAL LOW (ref >60)
Glucose, Bld: 124 mg/dL — ABNORMAL HIGH (ref 70–99)
Potassium: 3.8 mmol/L (ref 3.5–5.1)
Sodium: 141 mmol/L (ref 135–145)

## 2019-04-16 MED ORDER — CEPHALEXIN 500 MG PO CAPS
500.0000 mg | ORAL_CAPSULE | Freq: Once | ORAL | Status: AC
  Administered 2019-04-16: 06:00:00 500 mg via ORAL
  Filled 2019-04-16: qty 1

## 2019-04-16 MED ORDER — CEPHALEXIN 500 MG PO CAPS: 500.0000 mg | ORAL_CAPSULE | Freq: Two times a day (BID) | ORAL | 0 refills | Status: AC

## 2019-04-16 MED ORDER — FENTANYL CITRATE (PF) 100 MCG/2ML IJ SOLN
25.0000 ug | Freq: Once | INTRAMUSCULAR | Status: AC
  Administered 2019-04-16: 25 ug via INTRAVENOUS
  Filled 2019-04-16: qty 2

## 2019-04-16 NOTE — ED Notes (Signed)
X-ray at bedside

## 2019-04-16 NOTE — ED Notes (Signed)
Purewick applied to patient. 

## 2019-04-16 NOTE — ED Notes (Signed)
Called [facility] for transport.  

## 2019-04-16 NOTE — ED Provider Notes (Signed)
[facility] DEPT Provider Note: Shannon Spurling, MD, FACEP  CSN: [id number] MRN: [id number] ARRIVAL: 04/15/19 at 2316 ROOM: [place] 19+  Level 5 caveat: Dementia HISTORY OF PRESENT ILLNESS  04/16/19 12:05 AM Shannon Gomez is a 85 y.o. female who is reportedly Covid positive.  She has not had any fever or respiratory symptoms attributable to Covid.  She has been falling more frequently out of her wheelchair at her nursing home.  She was brought by EMS due to another unwitnessed fall from her wheelchair.  They states she is normally able to move both lower extremities but is now holding her left lower extremity in a flexed position at the hip and knee.  Any attempt to move her left lower extremity results in screams of pain.  No other evidence of injury has been noted.  EMS was unable to place a c-collar prior to arrival.  Past Medical History:  Diagnosis Date  . Chronic kidney disease   . Chronic pain   . COLONIC POLYPS, HX OF 01/01/2008   Qualifier: Diagnosis of  By: Redmond Pulling MD, Frann Rider    . Dementia ([place])   . DIVERTICULITIS, HX OF 01/01/2008   Qualifier: Diagnosis of  By: Redmond Pulling MD, Frann Rider    . GASTRIC ULCER 01/01/2008   Qualifier: Diagnosis of  By: Redmond Pulling MD, Frann Rider    . GERD (gastroesophageal reflux disease)   . HCAP (healthcare-associated pneumonia) 05/17/2013  . Hypertension   . Thyroid disease   . Vitamin D deficiency     History reviewed. No pertinent surgical history.  No family history on file.  Social History   Tobacco Use  . Smoking status: Never Smoker  . Smokeless tobacco: Never Used  Substance Use Topics  . Alcohol use: Not on file  . Drug use: Not on file    Prior to Admission medications   Medication Sig Start Date End Date Taking? Authorizing Provider  Acetaminophen 325 MG CAPS Take 650 mg by mouth every 6 (six) hours as needed (pain).    Yes [provider]  amLODipine (NORVASC) 5 MG tablet  Take 0.5 tablets (2.5 mg total) by mouth daily. 03/14/16  Yes Lavina Hamman, MD  calcium carbonate (OS-CAL) 600 MG TABS tablet Take 600 mg by mouth 2 (two) times daily with a meal.   Yes [provider]  cholecalciferol (VITAMIN D3) 25 MCG (1000 UT) tablet Take 1,000 Units by mouth daily.   Yes [provider]  levothyroxine (SYNTHROID, LEVOTHROID) 25 MCG tablet Take 25 mcg by mouth daily before breakfast.    Yes [provider]  sennosides-docusate sodium (SENOKOT-S) 8.6-50 MG tablet Take 1 tablet by mouth 2 (two) times daily. \   Yes [provider]  cephALEXin (KEFLEX) 500 MG capsule Take 1 capsule (500 mg total) by mouth 2 (two) times daily. 04/16/19   Haila Dena, MD  atorvastatin (LIPITOR) 20 MG tablet Take 1 tablet (20 mg total) by mouth daily at 6 PM. Patient not taking: Reported on 04/16/2019 03/14/16 04/16/19  Lavina Hamman, MD    Allergies Sulfonamide derivatives   REVIEW OF SYSTEMS     PHYSICAL EXAMINATION  Initial Vital Signs There were no vitals taken for this visit.  Examination General: Well-developed, well-nourished female in no acute distress; appearance consistent with age of record HENT: normocephalic; atraumatic Eyes: pupils equal, round and reactive to light; extraocular muscles grossly intact Neck: supple; nontender Heart: Irregular rhythm Lungs: Normal respiratory effort  and excursion Abdomen: soft; nondistended; nontender Extremities: Upper extremity pulses +2; lower extremity pulses +1; left lower extremity held in flexion at the hip and knee, patient screams in pain at any attempt to move left lower extremity Neurologic: Awake, alert; nonverbal; noted to move all extremities except left lower Skin: Warm and dry   RESULTS  Summary of this visit's results, reviewed and interpreted by myself:   EKG Interpretation  Date/Time:    Ventricular Rate:    PR Interval:    QRS Duration:   QT Interval:    QTC  Calculation:   R Axis:     Text Interpretation:        Laboratory Studies: Results for orders placed or performed during the hospital encounter of 04/15/19 (from the past 24 hour(s))  CBC with Differential     Status: Abnormal   Collection Time: 04/16/19 12:31 AM  Result Value Ref Range   WBC 9.7 4.0 - 10.5 K/uL   RBC 4.58 3.87 - 5.11 MIL/uL   Hemoglobin 14.3 12.0 - 15.0 g/dL   HCT 16.144.0 09.636.0 - 04.546.0 %   MCV 96.1 80.0 - 100.0 fL   MCH 31.2 26.0 - 34.0 pg   MCHC 32.5 30.0 - 36.0 g/dL   RDW 40.912.8 81.111.5 - 91.415.5 %   Platelets 165 150 - 400 K/uL   nRBC 0.0 0.0 - 0.2 %   Neutrophils Relative % 78 %   Neutro Abs 7.5 1.7 - 7.7 K/uL   Lymphocytes Relative 11 %   Lymphs Abs 1.1 0.7 - 4.0 K/uL   Monocytes Relative 11 %   Monocytes Absolute 1.1 (H) 0.1 - 1.0 K/uL   Eosinophils Relative 0 %   Eosinophils Absolute 0.0 0.0 - 0.5 K/uL   Basophils Relative 0 %   Basophils Absolute 0.0 0.0 - 0.1 K/uL   Immature Granulocytes 0 %   Abs Immature Granulocytes 0.04 0.00 - 0.07 K/uL  Basic metabolic panel     Status: Abnormal   Collection Time: 04/16/19 12:31 AM  Result Value Ref Range   Sodium 141 135 - 145 mmol/L   Potassium 3.8 3.5 - 5.1 mmol/L   Chloride 106 98 - 111 mmol/L   CO2 23 22 - 32 mmol/L   Glucose, Bld 124 (H) 70 - 99 mg/dL   BUN 23 8 - 23 mg/dL   Creatinine, Ser 7.820.89 0.44 - 1.00 mg/dL   Calcium 8.8 (L) 8.9 - 10.3 mg/dL   GFR calc non Af Amer 52 (L) >60 mL/min   GFR calc Af Amer >60 >60 mL/min   Anion gap 12 5 - 15  Urinalysis, Routine w reflex microscopic     Status: Abnormal   Collection Time: 04/16/19  4:48 AM  Result Value Ref Range   Color, Urine AMBER (A) YELLOW   APPearance CLOUDY (A) CLEAR   Specific Gravity, Urine 1.016 1.005 - 1.030   pH 6.0 5.0 - 8.0   Glucose, UA NEGATIVE NEGATIVE mg/dL   Hgb urine dipstick SMALL (A) NEGATIVE   Bilirubin Urine NEGATIVE NEGATIVE   Ketones, ur 5 (A) NEGATIVE mg/dL   Protein, ur >=956>=300 (A) NEGATIVE mg/dL   Nitrite POSITIVE (A)  NEGATIVE   Leukocytes,Ua LARGE (A) NEGATIVE   RBC / HPF >50 (H) 0 - 5 RBC/hpf   WBC, UA >50 (H) 0 - 5 WBC/hpf   Bacteria, UA MANY (A) NONE SEEN   WBC Clumps PRESENT    Imaging Studies: DG Tibia/Fibula Left  Result Date: 04/16/2019 CLINICAL DATA:  85 year old post fall; pain; COVID+ EXAM: LEFT TIBIA AND FIBULA - 2 VIEW COMPARISON:  None. FINDINGS: There are impaction fractures of the distal tibia and fibular metaphyses. Proximal tibia and fibula appear intact. The ankle joint is not well assessed given positioning. Diffuse bony under mineralization. Mild soft tissue edema of the distal lower leg. IMPRESSION: Unchanged impaction fractures of the distal tibia and fibular metaphyses. No proximal tibial or fibular fracture. The ankle joint is not well assessed given positioning. Electronically Signed   By: Narda Rutherford M.D.   On: 04/16/2019 01:08   DG Pelvis Portable  Result Date: 04/16/2019 CLINICAL DATA:  85 year old post fall; pain; COVID+. EXAM: PORTABLE PELVIS 1-2 VIEWS COMPARISON:  None. FINDINGS: Patient had difficulty with positioning. Lateral aspect of the right greater trochanter is not entirely included in the field of view. The bones are under mineralized. The cortical margins of the bony pelvis are grossly intact. No visualized fracture. Pubic symphysis and sacroiliac joints are congruent. Both femoral heads are well-seated in the respective acetabula. Scoliotic curvature of the lower lumbar spine. IMPRESSION: No evidence of pelvic fracture. Portions of the right proximal lateral femur are excluded from the field of view. Electronically Signed   By: Narda Rutherford M.D.   On: 04/16/2019 01:05   DG Femur Portable Min 2 Views Left  Result Date: 04/16/2019 CLINICAL DATA:  85 year old post fall; pain; COVID+ EXAM: LEFT FEMUR PORTABLE 2 VIEWS COMPARISON:  None. FINDINGS: Patient had difficulty with positioning. Cortical margins of the femur are intact. No evidence of fracture. Bones  are under mineralized. Low lying patella likely positioning. Femoral head is well seated in the acetabulum. There are vascular calcifications. No focal soft tissue abnormality IMPRESSION: No evidence of left femur fracture. Electronically Signed   By: Narda Rutherford M.D.   On: 04/16/2019 01:06    ED COURSE and MDM  Nursing notes, initial and subsequent vitals signs, including pulse oximetry, reviewed and interpreted by myself.  Vitals:   04/16/19 0300 04/16/19 0330 04/16/19 0500 04/16/19 0530  BP: (!) 166/99 (!) 186/69 (!) 152/105 (!) 185/89  Pulse: (!) 55 (!) 57 72 70  Resp: 15 17 20 17   SpO2: 96% 95% 94% 96%   Medications  cephALEXin (KEFLEX) capsule 500 mg (has no administration in time range)  fentaNYL (SUBLIMAZE) injection 25 mcg (25 mcg Intravenous Given 04/16/19 0033)   5:51 AM Patient's left ankle placed in ASO.  I do not believe she would tolerate the placement or presence of a plaster posterior indoor stirrup splint.  She is normally wheelchair-bound so this should not interfere with mobility.  She also has a urinary tract infection and we will treat her for this.  PROCEDURES  Procedures   ED DIAGNOSES     ICD-10-CM   1. Fall from wheelchair, initial encounter  W05.0XXA   2. Frequent falls  R29.6   3. Closed fracture of left ankle, initial encounter  S82.892A   4. Lower urinary tract infectious disease  N39.0   5. COVID-19 virus infection  U07.1        Aleeya Veitch, MD 04/16/19 0600

## 2019-04-18 ENCOUNTER — Emergency Department (HOSPITAL_COMMUNITY)
Admission: EM | Admit: 2019-04-18 | Discharge: 2019-04-18 | Disposition: A | Discharge status: Home / Self Care | Payer: Medicare Other | Attending: Emergency Medicine | Admitting: Emergency Medicine

## 2019-04-18 ENCOUNTER — Emergency Department (HOSPITAL_COMMUNITY): Payer: Medicare Other

## 2019-04-18 ENCOUNTER — Encounter (HOSPITAL_COMMUNITY): Payer: Self-pay | Admitting: Emergency Medicine

## 2019-04-18 DIAGNOSIS — S0081XA Abrasion of other part of head, initial encounter: Secondary | ICD-10-CM | POA: Diagnosis not present

## 2019-04-18 DIAGNOSIS — U071 COVID-19: Secondary | ICD-10-CM | POA: Diagnosis not present

## 2019-04-18 DIAGNOSIS — N189 Chronic kidney disease, unspecified: Secondary | ICD-10-CM | POA: Diagnosis not present

## 2019-04-18 DIAGNOSIS — I129 Hypertensive chronic kidney disease with stage 1 through stage 4 chronic kidney disease, or unspecified chronic kidney disease: Secondary | ICD-10-CM | POA: Insufficient documentation

## 2019-04-18 DIAGNOSIS — Z8673 Personal history of transient ischemic attack (TIA), and cerebral infarction without residual deficits: Secondary | ICD-10-CM | POA: Insufficient documentation

## 2019-04-18 DIAGNOSIS — Y939 Activity, unspecified: Secondary | ICD-10-CM | POA: Insufficient documentation

## 2019-04-18 DIAGNOSIS — Z79899 Other long term (current) drug therapy: Secondary | ICD-10-CM | POA: Insufficient documentation

## 2019-04-18 DIAGNOSIS — W19XXXA Unspecified fall, initial encounter: Secondary | ICD-10-CM | POA: Insufficient documentation

## 2019-04-18 DIAGNOSIS — S0083XA Contusion of other part of head, initial encounter: Secondary | ICD-10-CM | POA: Insufficient documentation

## 2019-04-18 DIAGNOSIS — T148XXA Other injury of unspecified body region, initial encounter: Secondary | ICD-10-CM

## 2019-04-18 DIAGNOSIS — Y999 Unspecified external cause status: Secondary | ICD-10-CM | POA: Diagnosis not present

## 2019-04-18 DIAGNOSIS — E860 Dehydration: Secondary | ICD-10-CM | POA: Diagnosis not present

## 2019-04-18 DIAGNOSIS — Y92129 Unspecified place in nursing home as the place of occurrence of the external cause: Secondary | ICD-10-CM | POA: Diagnosis not present

## 2019-04-18 DIAGNOSIS — J449 Chronic obstructive pulmonary disease, unspecified: Secondary | ICD-10-CM | POA: Insufficient documentation

## 2019-04-18 DIAGNOSIS — S0990XA Unspecified injury of head, initial encounter: Secondary | ICD-10-CM | POA: Diagnosis present

## 2019-04-18 DIAGNOSIS — E039 Hypothyroidism, unspecified: Secondary | ICD-10-CM | POA: Diagnosis not present

## 2019-04-18 DIAGNOSIS — F0390 Unspecified dementia without behavioral disturbance: Secondary | ICD-10-CM | POA: Insufficient documentation

## 2019-04-18 LAB — COMPREHENSIVE METABOLIC PANEL
ALT: 33 U/L (ref 0–44)
AST: 57 U/L — ABNORMAL HIGH (ref 15–41)
Albumin: 3.5 g/dL (ref 3.5–5.0)
Alkaline Phosphatase: 72 U/L (ref 38–126)
Anion gap: 16 — ABNORMAL HIGH (ref 5–15)
BUN: 46 mg/dL — ABNORMAL HIGH (ref 8–23)
CO2: 23 mmol/L (ref 22–32)
Calcium: 8.9 mg/dL (ref 8.9–10.3)
Chloride: 110 mmol/L (ref 98–111)
Creatinine, Ser: 1.23 mg/dL — ABNORMAL HIGH (ref 0.44–1.00)
GFR calc Af Amer: 41 mL/min — ABNORMAL LOW (ref >60)
GFR calc non Af Amer: 35 mL/min — ABNORMAL LOW (ref >60)
Glucose, Bld: 128 mg/dL — ABNORMAL HIGH (ref 70–99)
Potassium: 3.9 mmol/L (ref 3.5–5.1)
Sodium: 149 mmol/L — ABNORMAL HIGH (ref 135–145)
Total Bilirubin: 1.2 mg/dL (ref 0.3–1.2)
Total Protein: 7.1 g/dL (ref 6.5–8.1)

## 2019-04-18 LAB — CBC WITH DIFFERENTIAL/PLATELET
Abs Immature Granulocytes: 0.04 10*3/uL (ref 0.00–0.07)
Basophils Absolute: 0 10*3/uL (ref 0.0–0.1)
Basophils Relative: 0 %
Eosinophils Absolute: 0 10*3/uL (ref 0.0–0.5)
Eosinophils Relative: 0 %
HCT: 47.5 % — ABNORMAL HIGH (ref 36.0–46.0)
Hemoglobin: 15.3 g/dL — ABNORMAL HIGH (ref 12.0–15.0)
Immature Granulocytes: 0 %
Lymphocytes Relative: 7 %
Lymphs Abs: 0.8 10*3/uL (ref 0.7–4.0)
MCH: 31 pg (ref 26.0–34.0)
MCHC: 32.2 g/dL (ref 30.0–36.0)
MCV: 96.3 fL (ref 80.0–100.0)
Monocytes Absolute: 0.9 10*3/uL (ref 0.1–1.0)
Monocytes Relative: 9 %
Neutro Abs: 8.5 10*3/uL — ABNORMAL HIGH (ref 1.7–7.7)
Neutrophils Relative %: 84 %
Platelets: 244 10*3/uL (ref 150–400)
RBC: 4.93 MIL/uL (ref 3.87–5.11)
RDW: 12.9 % (ref 11.5–15.5)
WBC: 10.3 10*3/uL (ref 4.0–10.5)
nRBC: 0 % (ref 0.0–0.2)

## 2019-04-18 MED ORDER — SODIUM CHLORIDE 0.9 % IV BOLUS
500.0000 mL | Freq: Once | INTRAVENOUS | Status: AC
  Administered 2019-04-18: 500 mL via INTRAVENOUS

## 2019-04-18 NOTE — ED Triage Notes (Addendum)
Per EMS, patient from [facility], staff reports unwitnessed fall today. Hematoma to forehead. Seen for fall x3 days ago. A&O to baseline. Hx dementia. Staff reports patient Covid+.

## 2019-04-18 NOTE — ED Notes (Signed)
[facility] called for transport.  

## 2019-04-18 NOTE — ED Notes (Signed)
XR at bedside

## 2019-04-18 NOTE — ED Provider Notes (Addendum)
[facility] DEPT Provider Note   CSN: [id number] Arrival date & time: 04/18/19  1157     History Chief Complaint  Patient presents with  . Covid+  . Fall    Shannon Gomez is a 85 y.o. female.  HPI     85 year old female comes in a chief complaint of fall.  Patient has history of COVID-19, dementia, hypertension and thyroid disorder.  According to the EMS report patient resides at [facility] and she had an unwitnessed fall.  She is essentially wheelchair-bound.  Patient was seen in the ED for same complaint about 3 days ago.  Mental status is at baseline.  Medication review does not reveal any anticoagulants.  Level 5 caveat for severe dementia.  Past Medical History:  Diagnosis Date  . Chronic kidney disease   . Chronic pain   . COLONIC POLYPS, HX OF 01/01/2008   Qualifier: Diagnosis of  By: Redmond Pulling MD, Frann Rider    . Dementia ([place])   . DIVERTICULITIS, HX OF 01/01/2008   Qualifier: Diagnosis of  By: Redmond Pulling MD, Frann Rider    . GASTRIC ULCER 01/01/2008   Qualifier: Diagnosis of  By: Redmond Pulling MD, Frann Rider    . GERD (gastroesophageal reflux disease)   . HCAP (healthcare-associated pneumonia) 05/17/2013  . Hypertension   . Thyroid disease   . Vitamin D deficiency     Patient Active Problem List   Diagnosis Date Noted  . Stroke (cerebrum) ([place]) 03/12/2016  . Bilateral carotid artery stenosis   . Stroke ([place]) 03/11/2016  . Seborrheic dermatitis of scalp 07/11/2015  . Bradycardia 06/22/2015  . COPD (chronic obstructive pulmonary disease) ([place]) 04/30/2015  . Vitamin D deficiency 04/30/2015  . Chronic pain 08/01/2014  . Senile osteoporosis 08/01/2014  . Dry mouth 08/01/2014  . Xerophthalmia 12/16/2013  . Essential hypertension, benign 11/29/2013  . FTT (failure to thrive) in adult 05/30/2013  . Dementia without behavioral disturbance ([place]) 04/27/2013  . Edema 12/23/2012  . Constipation 12/23/2012  . Hypothyroidism 01/01/2008  .  DEPRESSION 01/01/2008  . GLAUCOMA 01/01/2008  . CAD 01/01/2008  . ABNORMAL HEART RHYTHMS 01/01/2008  . ALLERGIC RHINITIS 01/01/2008  . COPD 01/01/2008  . GERD 01/01/2008  . Osteoarthritis 01/01/2008  . INSOMNIA 01/01/2008    History reviewed. No pertinent surgical history.   OB History   No obstetric history on file.     No family history on file.  Social History   Tobacco Use  . Smoking status: Never Smoker  . Smokeless tobacco: Never Used  Substance Use Topics  . Alcohol use: Not on file  . Drug use: Not on file    Home Medications Prior to Admission medications   Medication Sig Start Date End Date Taking? Authorizing Provider  Acetaminophen 325 MG CAPS Take 650 mg by mouth See admin instructions. 650mg  every 8 hours scheduled Also 650mg  every 6 hours as needed for pain   Yes [provider]  amLODipine (NORVASC) 2.5 MG tablet Take 2.5 mg by mouth daily. 04/05/19  Yes [provider]  calcium carbonate (OS-CAL) 600 MG TABS tablet Take 600 mg by mouth 2 (two) times daily with a meal.   Yes [provider]  cholecalciferol (VITAMIN D3) 25 MCG (1000 UT) tablet Take 1,000 Units by mouth daily.   Yes [provider]  levothyroxine (SYNTHROID, LEVOTHROID) 25 MCG tablet Take 25 mcg by mouth daily before breakfast.    Yes [provider]  sennosides-docusate sodium (SENOKOT-S) 8.6-50 MG tablet Take 1 tablet  by mouth 2 (two) times daily. \   Yes [provider]  traMADol (ULTRAM) 50 MG tablet Take 25 mg by mouth every 8 (eight) hours as needed for pain. 04/16/19  Yes [provider]  amLODipine (NORVASC) 5 MG tablet Take 0.5 tablets (2.5 mg total) by mouth daily. Patient not taking: Reported on 04/18/2019 03/14/16   Rolly Salter, MD  cephALEXin (KEFLEX) 500 MG capsule Take 1 capsule (500 mg total) by mouth 2 (two) times daily. 04/16/19   Molpus, John, MD  atorvastatin (LIPITOR) 20 MG tablet Take 1 tablet (20 mg  total) by mouth daily at 6 PM. Patient not taking: Reported on 04/16/2019 03/14/16 04/16/19  Rolly Salter, MD    Allergies    Sulfonamide derivatives  Review of Systems   Review of Systems  Unable to perform ROS: Dementia    Physical Exam Updated Vital Signs BP (!) 151/71   Pulse 71   Temp 98.7 F (37.1 C) (Oral)   Resp 18   SpO2 94%   Physical Exam Vitals and nursing note reviewed.  Constitutional:      General: She is not in acute distress.    Appearance: She is well-developed. She is not ill-appearing or toxic-appearing.  HENT:     Head:     Comments: Left forehead abrasion with hematoma and mild bleeding. Cardiovascular:     Rate and Rhythm: Normal rate.  Pulmonary:     Effort: Pulmonary effort is normal.  Abdominal:     General: Bowel sounds are normal.  Musculoskeletal:     Comments: Patient is contracted or her lower extremities. She is moving all 4 extremities. Besides a contraction no other gross deformity appreciated. No focal tenderness appreciated with spine evaluation.  Skin:    General: Skin is warm and dry.  Neurological:     Mental Status: She is alert and oriented to person, place, and time.     ED Results / Procedures / Treatments   Labs (all labs ordered are listed, but only abnormal results are displayed) Labs Reviewed  COMPREHENSIVE METABOLIC PANEL - Abnormal; Notable for the following components:      Result Value   Sodium 149 (*)    Glucose, Bld 128 (*)    BUN 46 (*)    Creatinine, Ser 1.23 (*)    AST 57 (*)    GFR calc non Af Amer 35 (*)    GFR calc Af Amer 41 (*)    Anion gap 16 (*)    All other components within normal limits  CBC WITH DIFFERENTIAL/PLATELET - Abnormal; Notable for the following components:   Hemoglobin 15.3 (*)    HCT 47.5 (*)    Neutro Abs 8.5 (*)    All other components within normal limits    EKG None  Radiology DG Pelvis 1-2 Views  Result Date: 04/18/2019 CLINICAL DATA:  Unwitnessed fall  today. EXAM: PELVIS - 1-2 VIEW COMPARISON:  Radiograph dated 04/16/2019 FINDINGS: There is no evidence of pelvic fracture or diastasis. The left femur is flexed across the inferior pubic rami obscuring the detail of those bones. IMPRESSION: No acute abnormality. The left femur is flexed across the inferior pubic rami. Electronically Signed   By: Francene Boyers M.D.   On: 04/18/2019 13:35   CT Head Wo Contrast  Result Date: 04/18/2019 CLINICAL DATA:  The patient was found down. Forehead hematoma. EXAM: CT HEAD WITHOUT CONTRAST TECHNIQUE: Contiguous axial images were obtained from the base of the skull  through the vertex without intravenous contrast. COMPARISON:  CT scan dated 03/11/2016 FINDINGS: Brain: There is a tiny subdural hematoma over the left frontal parietal region seen on image 34 of series 6 and image 38 of series 5. This does not appear to be acute. There is no mass effect. Chronic diffuse cerebral cortical atrophy. Chronic calcified 2 cm meningioma in the left middle cranial fossa. Vascular: No hyperdense vessel or unexpected calcification. Skull: Normal. Negative for fracture or focal lesion. Sinuses/Orbits: No acute finding. Other: Small focal area of scalp swelling over the left side of the frontal bone just above the left orbit. IMPRESSION: 1. Tiny probable old subdural hematoma over the left frontal parietal region without mass effect. 2. Chronic diffuse cerebral cortical atrophy. 3. Chronic calcified 2 cm meningioma in the left middle cranial fossa. 4. Small focal area of scalp swelling over the left side of the frontal bone just above the left orbit. Electronically Signed   By: Francene Boyers M.D.   On: 04/18/2019 13:43   CT Cervical Spine Wo Contrast  Result Date: 04/18/2019 CLINICAL DATA:  The patient was found down. Head trauma. EXAM: CT CERVICAL SPINE WITHOUT CONTRAST TECHNIQUE: Multidetector CT imaging of the cervical spine was performed without intravenous contrast. Multiplanar CT  image reconstructions were also generated. COMPARISON:  None. FINDINGS: Alignment: Minimal anterolisthesis of C2 on C3 and of C3 on C4. Skull base and vertebrae: No acute fracture. No primary bone lesion. Ankylosis of the lateral masses of C3-4 bilaterally. Soft tissues and spinal canal: No prevertebral fluid or swelling. No visible canal hematoma. Disc levels: C2-3: No disc bulging or protrusion. Moderate right and slight left facet arthritis. No foraminal stenosis. C2-3: Disc space narrowing. No disc bulging or protrusion. Ankylosis of the facet joints bilaterally. No foraminal stenosis. C4-5: Marked disc space narrowing. Broad-based endplate osteophytes narrow the spinal canal asymmetrically to the left. No significant foraminal stenosis. Minimal bilateral facet arthritis. C5-6: Disc space narrowing. Broad-based endplate osteophytes asymmetric to the left compressing the left side of the thecal sac. No foraminal stenosis. C6-7: Prominent endplate osteophytes likely compress the cervical spinal cord centrally. No foraminal stenosis. C7-T1: Chronic disc space narrowing. No appreciable disc protrusion. Slight narrowing of the neural foramina. T1-2: Chronic disc space narrowing. No neural impingement. Upper chest: Negative. Other: None IMPRESSION: 1. No acute abnormality of the cervical spine. 2. Multilevel degenerative disc and joint disease. Electronically Signed   By: Francene Boyers M.D.   On: 04/18/2019 13:49   DG Chest Port 1 View  Result Date: 04/18/2019 CLINICAL DATA:  Unwitnessed fall today. EXAM: PORTABLE CHEST 1 VIEW COMPARISON:  03/11/2016 FINDINGS: The heart size and pulmonary vascularity are normal. Aortic atherosclerosis. Lungs are clear. No acute bone abnormality. IMPRESSION: 1. No acute abnormalities. 2.  Aortic Atherosclerosis (ICD10-I70.0). Electronically Signed   By: Francene Boyers M.D.   On: 04/18/2019 13:32    Procedures Procedures (including critical care time)  Medications Ordered in  ED Medications  sodium chloride 0.9 % bolus 500 mL (has no administration in time range)    ED Course  I have reviewed the triage vital signs and the nursing notes.  Pertinent labs & imaging results that were available during my care of the patient were reviewed by me and considered in my medical decision making (see chart for details).  Clinical Course as of Apr 17 1446  Sun Apr 18, 2019  1446 Patient has rising creatinine with BUN to creatinine ratio indicating slight dehydration.  We will give  her finder cc of fluid in the ED prior to discharge.  Creatinine(!): 1.23 [AN]    Clinical Course User Index [AN] Derwood KaplanNanavati, Ryer Asato, MD   MDM Rules/Calculators/A&P       Dianah Fieldlizabeth N Esh was evaluated in Emergency Department on 04/18/2019 for the symptoms described in the history of present illness. She was evaluated in the context of the global COVID-19 pandemic, which necessitated consideration that the patient might be at risk for infection with the SARS-CoV-2 virus that causes COVID-19. Institutional protocols and algorithms that pertain to the evaluation of patients at risk for COVID-19 are in a state of rapid change based on information released by regulatory bodies including the CDC and federal and state organizations. These policies and algorithms were followed during the patient's care in the ED.   85 year old comes in a chief complaint of fall.  She is Covid positive.  She had a mechanical fall that was unwitnessed and is noted to have hematoma with mild abrasion to the forehead.  We will not need to repair the laceration with sutures.  Dressing will be applied to the area.  Additionally she needs CT head, C-spine and radiographs to ensure there is no clinically significant injury.                   Final Clinical Impression(s) / ED Diagnoses Final diagnoses:  Hematoma  Abrasion  Dehydration  COVID-19    Rx / DC Orders ED Discharge Orders    None       Derwood KaplanNanavati, Valeta Paz,  MD 04/18/19 1302    Derwood KaplanNanavati, Kinnick Maus, MD 04/18/19 1447

## 2019-04-18 NOTE — ED Notes (Addendum)
Patient transported to CT 

## 2019-04-18 NOTE — Discharge Instructions (Addendum)
Shannon Gomez was seen in the ER after she had a fall.  We do not see any new bleed, there is evidence of small older subdural hematoma.  She has a small abrasion to her forehead that will heal on its own.  X-rays of her chest and pelvis area did not reveal any abnormalities.  Shannon Gomez blood work did reveal mild evidence of dehydration.  Please ensure that she is getting adequate hydration.

## 2019-04-18 NOTE — ED Notes (Signed)
Attempted to call report to [facility] but no answer from nursing stuff.  Message left with receptionist that pt will be returning to them.

## 2019-04-20 LAB — URINE CULTURE: Culture: >=100000 — AB

## 2019-04-21 ENCOUNTER — Telehealth: Payer: Self-pay | Admitting: Emergency Medicine

## 2019-04-21 NOTE — Telephone Encounter (Signed)
Post ED Visit - Positive Culture Follow-up  Culture report reviewed by antimicrobial stewardship pharmacist: [facility] Team []  Nathan Batchelder, Pharm.D. []  [place], Pharm.D., BCPS AQ-ID []  Heide Guile, Pharm.D., BCPS []  Parks Neptune, Pharm.D., BCPS []  [place], Pharm.D., BCPS, AAHIVP []  [place], Pharm.D., BCPS, AAHIVP []  Legrand Como, PharmD, BCPS []  Salome Arnt, PharmD, BCPS []  Johnnette Gourd, PharmD, BCPS []  Hughes Better, PharmD []  Leeroy Cha, PharmD, BCPS []  Laqueta Linden, PharmD  [facility] Team []  [address], PharmD []  Leodis Sias, PharmD [x]  Lindell Spar, PharmD []  Royetta Asal, Rph []  Graylin Shiver) Rema Fendt, PharmD []  Glennon Mac, PharmD []  Arlyn Dunning, PharmD []  Netta Cedars, PharmD []  Dia Sitter, PharmD []  Leone Haven, PharmD []  Gretta Arab, PharmD []  Theodis Shove, PharmD []  Peggyann Juba, PharmD   Positive urine culture Treated with cephalexin, organism sensitive to the same and no further patient follow-up is required at this time.  Reuel Boom 04/21/2019, 3:58 PM

## 2019-05-07 DEATH — deceased
